# Patient Record
Sex: Female | Born: 1935 | Race: White | Hispanic: No | Smoking: Former smoker
Health system: Southern US, Community
[De-identification: ages and names within clinical notes are randomized; demographics above are authoritative.]

## PROBLEM LIST (undated history)

## (undated) DIAGNOSIS — I10 Essential (primary) hypertension: Secondary | ICD-10-CM

## (undated) DIAGNOSIS — F0390 Unspecified dementia without behavioral disturbance: Secondary | ICD-10-CM

## (undated) DIAGNOSIS — E269 Hyperaldosteronism, unspecified: Secondary | ICD-10-CM

## (undated) DIAGNOSIS — C801 Malignant (primary) neoplasm, unspecified: Secondary | ICD-10-CM

## (undated) DIAGNOSIS — M797 Fibromyalgia: Secondary | ICD-10-CM

## (undated) DIAGNOSIS — E559 Vitamin D deficiency, unspecified: Secondary | ICD-10-CM

## (undated) DIAGNOSIS — E876 Hypokalemia: Secondary | ICD-10-CM

## (undated) DIAGNOSIS — N289 Disorder of kidney and ureter, unspecified: Secondary | ICD-10-CM

## (undated) HISTORY — PX: BREAST SURGERY: SHX581

---

## 1998-03-05 ENCOUNTER — Other Ambulatory Visit: Admission: RE | Admit: 1998-03-05 | Discharge: 1998-03-05 | Payer: Self-pay | Admitting: Internal Medicine

## 1998-06-25 ENCOUNTER — Ambulatory Visit (HOSPITAL_COMMUNITY): Admission: RE | Admit: 1998-06-25 | Discharge: 1998-06-25 | Payer: Self-pay | Admitting: Internal Medicine

## 1998-09-10 ENCOUNTER — Ambulatory Visit (HOSPITAL_COMMUNITY): Admission: RE | Admit: 1998-09-10 | Discharge: 1998-09-10 | Payer: Self-pay | Admitting: *Deleted

## 1998-09-16 ENCOUNTER — Encounter: Payer: Self-pay | Admitting: *Deleted

## 1998-09-16 ENCOUNTER — Ambulatory Visit (HOSPITAL_COMMUNITY): Admission: RE | Admit: 1998-09-16 | Discharge: 1998-09-16 | Payer: Self-pay | Admitting: *Deleted

## 1998-12-12 ENCOUNTER — Encounter: Payer: Self-pay | Admitting: Internal Medicine

## 1998-12-12 ENCOUNTER — Inpatient Hospital Stay (HOSPITAL_COMMUNITY): Admission: EM | Admit: 1998-12-12 | Discharge: 1998-12-13 | Payer: Self-pay | Admitting: Emergency Medicine

## 1999-07-07 ENCOUNTER — Ambulatory Visit (HOSPITAL_COMMUNITY): Admission: RE | Admit: 1999-07-07 | Discharge: 1999-07-07 | Payer: Self-pay | Admitting: Internal Medicine

## 1999-07-07 ENCOUNTER — Encounter: Payer: Self-pay | Admitting: Internal Medicine

## 1999-09-02 ENCOUNTER — Emergency Department (HOSPITAL_COMMUNITY): Admission: EM | Admit: 1999-09-02 | Discharge: 1999-09-02 | Payer: Self-pay | Admitting: *Deleted

## 1999-09-02 ENCOUNTER — Encounter: Payer: Self-pay | Admitting: *Deleted

## 2000-07-08 ENCOUNTER — Ambulatory Visit (HOSPITAL_COMMUNITY): Admission: RE | Admit: 2000-07-08 | Discharge: 2000-07-08 | Payer: Self-pay | Admitting: Internal Medicine

## 2000-07-08 ENCOUNTER — Encounter: Payer: Self-pay | Admitting: Internal Medicine

## 2001-05-27 ENCOUNTER — Encounter: Payer: Self-pay | Admitting: Internal Medicine

## 2001-05-27 ENCOUNTER — Encounter: Admission: RE | Admit: 2001-05-27 | Discharge: 2001-05-27 | Payer: Self-pay | Admitting: Internal Medicine

## 2001-07-07 ENCOUNTER — Encounter: Admission: RE | Admit: 2001-07-07 | Discharge: 2001-08-04 | Payer: Self-pay | Admitting: Internal Medicine

## 2001-07-14 ENCOUNTER — Ambulatory Visit (HOSPITAL_COMMUNITY): Admission: RE | Admit: 2001-07-14 | Discharge: 2001-07-14 | Payer: Self-pay | Admitting: Internal Medicine

## 2001-07-14 ENCOUNTER — Encounter: Payer: Self-pay | Admitting: Internal Medicine

## 2001-08-09 ENCOUNTER — Other Ambulatory Visit: Admission: RE | Admit: 2001-08-09 | Discharge: 2001-08-09 | Payer: Self-pay | Admitting: Internal Medicine

## 2001-08-16 ENCOUNTER — Encounter: Payer: Self-pay | Admitting: Internal Medicine

## 2001-08-16 ENCOUNTER — Encounter: Admission: RE | Admit: 2001-08-16 | Discharge: 2001-08-16 | Payer: Self-pay | Admitting: Internal Medicine

## 2002-07-20 ENCOUNTER — Encounter: Payer: Self-pay | Admitting: Internal Medicine

## 2002-07-20 ENCOUNTER — Ambulatory Visit (HOSPITAL_COMMUNITY): Admission: RE | Admit: 2002-07-20 | Discharge: 2002-07-20 | Payer: Self-pay | Admitting: Internal Medicine

## 2002-08-17 ENCOUNTER — Ambulatory Visit (HOSPITAL_COMMUNITY): Admission: RE | Admit: 2002-08-17 | Discharge: 2002-08-17 | Payer: Self-pay | Admitting: *Deleted

## 2002-08-17 ENCOUNTER — Other Ambulatory Visit: Admission: RE | Admit: 2002-08-17 | Discharge: 2002-08-17 | Payer: Self-pay | Admitting: Internal Medicine

## 2003-04-05 ENCOUNTER — Ambulatory Visit (HOSPITAL_COMMUNITY): Admission: RE | Admit: 2003-04-05 | Discharge: 2003-04-05 | Payer: Self-pay | Admitting: *Deleted

## 2003-07-19 ENCOUNTER — Encounter: Payer: Self-pay | Admitting: Internal Medicine

## 2003-07-19 ENCOUNTER — Ambulatory Visit (HOSPITAL_COMMUNITY): Admission: RE | Admit: 2003-07-19 | Discharge: 2003-07-19 | Payer: Self-pay | Admitting: Internal Medicine

## 2003-12-19 ENCOUNTER — Emergency Department (HOSPITAL_COMMUNITY): Admission: EM | Admit: 2003-12-19 | Discharge: 2003-12-20 | Payer: Self-pay | Admitting: Emergency Medicine

## 2004-08-14 ENCOUNTER — Ambulatory Visit (HOSPITAL_COMMUNITY): Admission: RE | Admit: 2004-08-14 | Discharge: 2004-08-14 | Payer: Self-pay | Admitting: Internal Medicine

## 2005-09-10 ENCOUNTER — Ambulatory Visit (HOSPITAL_COMMUNITY): Admission: RE | Admit: 2005-09-10 | Discharge: 2005-09-10 | Payer: Self-pay | Admitting: Internal Medicine

## 2006-04-15 ENCOUNTER — Encounter: Admission: RE | Admit: 2006-04-15 | Discharge: 2006-04-15 | Payer: Self-pay | Admitting: Internal Medicine

## 2006-04-24 ENCOUNTER — Encounter: Admission: RE | Admit: 2006-04-24 | Discharge: 2006-04-24 | Payer: Self-pay | Admitting: Internal Medicine

## 2006-05-13 ENCOUNTER — Encounter: Admission: RE | Admit: 2006-05-13 | Discharge: 2006-05-13 | Payer: Self-pay

## 2006-07-01 ENCOUNTER — Ambulatory Visit (HOSPITAL_COMMUNITY): Admission: RE | Admit: 2006-07-01 | Discharge: 2006-07-04 | Payer: Self-pay | Admitting: Orthopedic Surgery

## 2006-09-14 ENCOUNTER — Ambulatory Visit (HOSPITAL_COMMUNITY): Admission: RE | Admit: 2006-09-14 | Discharge: 2006-09-14 | Payer: Self-pay | Admitting: Internal Medicine

## 2006-11-04 ENCOUNTER — Encounter: Admission: RE | Admit: 2006-11-04 | Discharge: 2006-11-04 | Payer: Self-pay | Admitting: Internal Medicine

## 2007-09-23 ENCOUNTER — Encounter: Admission: RE | Admit: 2007-09-23 | Discharge: 2007-09-23 | Payer: Self-pay | Admitting: Internal Medicine

## 2008-01-04 ENCOUNTER — Ambulatory Visit (HOSPITAL_COMMUNITY): Admission: RE | Admit: 2008-01-04 | Discharge: 2008-01-04 | Payer: Self-pay | Admitting: *Deleted

## 2008-04-23 ENCOUNTER — Inpatient Hospital Stay (HOSPITAL_COMMUNITY): Admission: EM | Admit: 2008-04-23 | Discharge: 2008-04-24 | Payer: Self-pay | Admitting: Emergency Medicine

## 2008-08-16 ENCOUNTER — Ambulatory Visit: Payer: Self-pay | Admitting: Cardiology

## 2008-08-16 ENCOUNTER — Observation Stay (HOSPITAL_COMMUNITY): Admission: EM | Admit: 2008-08-16 | Discharge: 2008-08-17 | Payer: Self-pay | Admitting: Emergency Medicine

## 2008-08-17 ENCOUNTER — Ambulatory Visit: Payer: Self-pay | Admitting: Surgery

## 2008-08-17 ENCOUNTER — Encounter (INDEPENDENT_AMBULATORY_CARE_PROVIDER_SITE_OTHER): Payer: Self-pay | Admitting: Internal Medicine

## 2008-08-30 ENCOUNTER — Ambulatory Visit (HOSPITAL_COMMUNITY): Admission: RE | Admit: 2008-08-30 | Discharge: 2008-08-30 | Payer: Self-pay | Admitting: Internal Medicine

## 2008-10-05 ENCOUNTER — Ambulatory Visit (HOSPITAL_COMMUNITY): Admission: RE | Admit: 2008-10-05 | Discharge: 2008-10-05 | Payer: Self-pay | Admitting: Internal Medicine
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
## 2009-11-14 ENCOUNTER — Ambulatory Visit (HOSPITAL_COMMUNITY): Admission: RE | Admit: 2009-11-14 | Discharge: 2009-11-14 | Payer: Self-pay | Admitting: Internal Medicine

## 2010-02-04 ENCOUNTER — Ambulatory Visit: Payer: Self-pay | Admitting: Cardiology

## 2010-02-04 ENCOUNTER — Inpatient Hospital Stay (HOSPITAL_COMMUNITY)
Admission: EM | Admit: 2010-02-04 | Discharge: 2010-02-05 | Payer: Self-pay | Source: Home / Self Care | Admitting: Emergency Medicine

## 2010-02-05 ENCOUNTER — Encounter (INDEPENDENT_AMBULATORY_CARE_PROVIDER_SITE_OTHER): Payer: Self-pay | Admitting: Internal Medicine

## 2010-02-05 ENCOUNTER — Ambulatory Visit: Payer: Self-pay | Admitting: Vascular Surgery

## 2010-12-05 ENCOUNTER — Ambulatory Visit (HOSPITAL_COMMUNITY)
Admission: RE | Admit: 2010-12-05 | Discharge: 2010-12-05 | Payer: Self-pay | Source: Home / Self Care | Attending: Internal Medicine | Admitting: Internal Medicine

## 2010-12-12 ENCOUNTER — Other Ambulatory Visit: Payer: Self-pay | Admitting: Diagnostic Radiology

## 2010-12-12 ENCOUNTER — Encounter
Admission: RE | Admit: 2010-12-12 | Discharge: 2010-12-12 | Payer: Self-pay | Source: Home / Self Care | Attending: Internal Medicine | Admitting: Internal Medicine

## 2010-12-14 ENCOUNTER — Encounter: Payer: Self-pay | Admitting: Internal Medicine

## 2010-12-18 ENCOUNTER — Other Ambulatory Visit: Payer: Self-pay | Admitting: General Surgery

## 2010-12-18 ENCOUNTER — Other Ambulatory Visit (HOSPITAL_COMMUNITY): Payer: Self-pay | Admitting: General Surgery

## 2010-12-18 DIAGNOSIS — C50911 Malignant neoplasm of unspecified site of right female breast: Secondary | ICD-10-CM

## 2010-12-19 ENCOUNTER — Encounter
Admission: RE | Admit: 2010-12-19 | Discharge: 2010-12-19 | Payer: Self-pay | Source: Home / Self Care | Attending: Internal Medicine | Admitting: Internal Medicine

## 2010-12-25 ENCOUNTER — Other Ambulatory Visit: Payer: Self-pay | Admitting: General Surgery

## 2010-12-25 ENCOUNTER — Encounter (HOSPITAL_BASED_OUTPATIENT_CLINIC_OR_DEPARTMENT_OTHER)
Admission: RE | Admit: 2010-12-25 | Discharge: 2010-12-25 | Disposition: A | Discharge status: Home / Self Care | Payer: Medicare Other | Source: Ambulatory Visit | Attending: General Surgery | Admitting: General Surgery

## 2010-12-25 ENCOUNTER — Ambulatory Visit
Admission: RE | Admit: 2010-12-25 | Discharge: 2010-12-25 | Disposition: A | Discharge status: Home / Self Care | Payer: MEDICARE | Source: Ambulatory Visit | Attending: General Surgery | Admitting: General Surgery

## 2010-12-25 DIAGNOSIS — Z01812 Encounter for preprocedural laboratory examination: Secondary | ICD-10-CM | POA: Insufficient documentation

## 2010-12-25 DIAGNOSIS — Z01811 Encounter for preprocedural respiratory examination: Secondary | ICD-10-CM

## 2010-12-25 DIAGNOSIS — C50911 Malignant neoplasm of unspecified site of right female breast: Secondary | ICD-10-CM

## 2010-12-25 DIAGNOSIS — Z01810 Encounter for preprocedural cardiovascular examination: Secondary | ICD-10-CM | POA: Insufficient documentation

## 2010-12-25 LAB — URINALYSIS, ROUTINE W REFLEX MICROSCOPIC
Ketones, ur: NEGATIVE mg/dL
Nitrite: NEGATIVE
Protein, ur: NEGATIVE mg/dL
pH: 7 (ref 5.0–8.0)

## 2010-12-25 LAB — CBC
MCH: 29.8 pg (ref 26.0–34.0)
Platelets: 286 10*3/uL (ref 150–400)
RBC: 4.4 MIL/uL (ref 3.87–5.11)

## 2010-12-25 LAB — COMPREHENSIVE METABOLIC PANEL
Alkaline Phosphatase: 77 U/L (ref 39–117)
BUN: 18 mg/dL (ref 6–23)
Chloride: 93 mEq/L — ABNORMAL LOW (ref 96–112)
Glucose, Bld: 103 mg/dL — ABNORMAL HIGH (ref 70–99)
Potassium: 3.7 mEq/L (ref 3.5–5.1)
Total Bilirubin: 0.4 mg/dL (ref 0.3–1.2)

## 2010-12-25 LAB — DIFFERENTIAL
Basophils Absolute: 0.1 10*3/uL (ref 0.0–0.1)
Basophils Relative: 1 % (ref 0–1)
Eosinophils Absolute: 0.4 10*3/uL (ref 0.0–0.7)
Monocytes Relative: 9 % (ref 3–12)
Neutrophils Relative %: 60 % (ref 43–77)

## 2010-12-25 LAB — URINE MICROSCOPIC-ADD ON

## 2010-12-29 ENCOUNTER — Other Ambulatory Visit: Payer: Self-pay | Admitting: General Surgery

## 2010-12-29 ENCOUNTER — Ambulatory Visit: Admit: 2010-12-29 | Discharge: 2010-12-29 | Disposition: A | Discharge status: Home / Self Care | Payer: MEDICARE

## 2010-12-29 ENCOUNTER — Ambulatory Visit
Admission: RE | Admit: 2010-12-29 | Discharge: 2010-12-29 | Disposition: A | Discharge status: Home / Self Care | Payer: MEDICARE | Source: Ambulatory Visit | Attending: General Surgery | Admitting: General Surgery

## 2010-12-29 ENCOUNTER — Ambulatory Visit (HOSPITAL_COMMUNITY)
Admission: RE | Admit: 2010-12-29 | Discharge: 2010-12-29 | Disposition: A | Discharge status: Home / Self Care | Payer: Medicare Other | Source: Ambulatory Visit | Attending: General Surgery | Admitting: General Surgery

## 2010-12-29 ENCOUNTER — Ambulatory Visit (HOSPITAL_BASED_OUTPATIENT_CLINIC_OR_DEPARTMENT_OTHER)
Admission: RE | Admit: 2010-12-29 | Discharge: 2010-12-29 | Disposition: A | Discharge status: Home / Self Care | Payer: Medicare Other | Attending: General Surgery | Admitting: General Surgery

## 2010-12-29 DIAGNOSIS — Z01812 Encounter for preprocedural laboratory examination: Secondary | ICD-10-CM | POA: Insufficient documentation

## 2010-12-29 DIAGNOSIS — C50919 Malignant neoplasm of unspecified site of unspecified female breast: Secondary | ICD-10-CM | POA: Insufficient documentation

## 2010-12-29 DIAGNOSIS — Z01810 Encounter for preprocedural cardiovascular examination: Secondary | ICD-10-CM | POA: Insufficient documentation

## 2010-12-29 DIAGNOSIS — C50911 Malignant neoplasm of unspecified site of right female breast: Secondary | ICD-10-CM

## 2010-12-29 DIAGNOSIS — Z79899 Other long term (current) drug therapy: Secondary | ICD-10-CM | POA: Insufficient documentation

## 2010-12-29 MED ORDER — TECHNETIUM TC 99M SULFUR COLLOID FILTERED
1.0000 | Freq: Once | INTRAVENOUS | Status: AC | PRN
  Administered 2010-12-29: 1 via INTRADERMAL

## 2011-01-12 ENCOUNTER — Encounter: Payer: MEDICARE | Admitting: Oncology

## 2011-01-19 NOTE — Op Note (Signed)
NAMEDENNIS, KILLILEA            ACCOUNT NO.:  [id number]  MEDICAL RECORD NO.:  [id number]           PATIENT TYPE:  O  LOCATION:  NUC                          FACILITY:  [facility]  PHYSICIAN:  Sharlet Salina T. Deshanda Molitor, M.D.DATE OF BIRTH:  Nov 07, 1931  DATE OF PROCEDURE:  12/29/2010 DATE OF DISCHARGE:                              OPERATIVE REPORT   PREOPERATIVE DIAGNOSIS:  Cancer of the right breast.  POSTOPERATIVE DIAGNOSIS:  Cancer of the right breast.  SURGICAL PROCEDURES: 1. Blue dye injection, right breast. 2. Right axillary sentinel lymph node biopsy. 3. Needle-localized right breast lumpectomy.  SURGEON:  Lorne Skeens. Shakema Surita, MD  ANESTHESIA:  General.  BRIEF HISTORY:  Kaylee Turner is a 77 year old female who on recent mammogram was found to have a new approximately 1-cm mass in the inferolateral right breast.  Diagnostic imaging confirmed this and core biopsy has revealed an intermediate grade invasive ductal carcinoma. After discussion of initial surgical treatment options, we have elected to proceed with breast conservation with needle-localized lumpectomy and sentinel lymph node biopsy.  The nature of procedure, its indications, risks of bleeding, infection, anesthetic complications, and possible need for further surgery based on final pathology findings have been discussed and understood.  She is now brought to the operating room for this procedure.  DESCRIPTION OF OPERATION:  About 45 minutes preoperatively, 1 mCi of technetium sulfur colloid was injected by [facility] circumareolarly in the right breast.  The patient was brought to the operating room, placed in supine position on the operating table, and laryngeal mask general anesthesia was induced.  I sterilely prepped the right periareolar area and correct patient and procedure were verified. Following this, 5 mL of dilute methylene blue was injected subareolarly in the subcutaneous tissue and the  breast massaged for several minutes. Following this, the breast was widely sterilely prepped and draped.  The patient had undergone preoperative accurate needle [facility].  The NeoProbe was then used to identify an area in the right axilla of high counts and a small transverse incision was made here.  Dissection carried down through the subcutaneous tissue with cautery.  The axilla was carefully entered with blunt dissection.  Using the NeoProbe for guidance, I dissected down onto a lymph node that did contain blue dye and had high counts.  This lymph node was completely excised using cautery.  Ex vivo, the node had counts of about 300 and I could not get any counts in the background following excision.  This was sent for permanent section as hot blue right axillary sentinel lymph node.  The soft tissue was infiltrated with Marcaine and this wound was temporally packed.  Attention was then turned the lumpectomy.  I made a curvilinear incision in the inferolateral breast over the area of the tumor and there was a small palpable abnormality here.  Dissection was carried down into the superficial subcutaneous tissue.  The mass was somewhat superficial and short.  Skin and subcutaneous flaps were developed and then cautery was used to deepen dissection in all sides down through the breast tissue.  The wire was encountered and brought into the wound.  I then came around underneath the mass and it appeared to be well contained within the specimen.  The specimens was oriented with ink and specimen mammography was obtained showing the mass and the clip apparently in the center of the specimen.  The breast tissue was infiltrated with Marcaine and complete hemostasis was obtained.  Both wounds were then closed with subcutaneous interrupted 5-0 Monocryl and subcuticular 5-0 Monocryl and Dermabond.  Sponge, needle, and instrument counts were correct.  The patient was taken to  the recovery room in good condition.     Lorne Skeens. Chiquitta Matty, M.D.     Tory Emerald  D:  12/29/2010  T:  12/30/2010  Job:  [id number]  Electronically Signed by Glenna Fellows M.D. on 01/19/2011 03:12:00 PM

## 2011-01-20 ENCOUNTER — Ambulatory Visit: Payer: Medicare Other | Attending: Radiation Oncology | Admitting: Radiation Oncology

## 2011-01-20 DIAGNOSIS — Z7982 Long term (current) use of aspirin: Secondary | ICD-10-CM | POA: Insufficient documentation

## 2011-01-20 DIAGNOSIS — Z90710 Acquired absence of both cervix and uterus: Secondary | ICD-10-CM | POA: Insufficient documentation

## 2011-01-20 DIAGNOSIS — Z803 Family history of malignant neoplasm of breast: Secondary | ICD-10-CM | POA: Insufficient documentation

## 2011-01-20 DIAGNOSIS — C50919 Malignant neoplasm of unspecified site of unspecified female breast: Secondary | ICD-10-CM | POA: Insufficient documentation

## 2011-01-20 DIAGNOSIS — Z801 Family history of malignant neoplasm of trachea, bronchus and lung: Secondary | ICD-10-CM | POA: Insufficient documentation

## 2011-01-20 DIAGNOSIS — Z79899 Other long term (current) drug therapy: Secondary | ICD-10-CM | POA: Insufficient documentation

## 2011-01-20 DIAGNOSIS — Z9010 Acquired absence of unspecified breast and nipple: Secondary | ICD-10-CM | POA: Insufficient documentation

## 2011-01-20 DIAGNOSIS — I10 Essential (primary) hypertension: Secondary | ICD-10-CM | POA: Insufficient documentation

## 2011-01-20 DIAGNOSIS — Z170 Estrogen receptor positive status [ER+]: Secondary | ICD-10-CM | POA: Insufficient documentation

## 2011-02-02 ENCOUNTER — Encounter (HOSPITAL_BASED_OUTPATIENT_CLINIC_OR_DEPARTMENT_OTHER): Payer: Medicare Other | Admitting: Oncology

## 2011-02-02 ENCOUNTER — Other Ambulatory Visit: Payer: Self-pay | Admitting: Oncology

## 2011-02-02 DIAGNOSIS — Z853 Personal history of malignant neoplasm of breast: Secondary | ICD-10-CM

## 2011-02-02 DIAGNOSIS — C50419 Malignant neoplasm of upper-outer quadrant of unspecified female breast: Secondary | ICD-10-CM

## 2011-02-02 DIAGNOSIS — Z780 Asymptomatic menopausal state: Secondary | ICD-10-CM

## 2011-02-02 DIAGNOSIS — C50919 Malignant neoplasm of unspecified site of unspecified female breast: Secondary | ICD-10-CM

## 2011-02-02 LAB — CBC WITH DIFFERENTIAL/PLATELET
Eosinophils Absolute: 0.4 10*3/uL (ref 0.0–0.5)
MONO#: 0.7 10*3/uL (ref 0.1–0.9)
NEUT#: 5.7 10*3/uL (ref 1.5–6.5)
RBC: 4.12 10*6/uL (ref 3.70–5.45)
RDW: 12.9 % (ref 11.2–14.5)
WBC: 9.1 10*3/uL (ref 3.9–10.3)

## 2011-02-02 LAB — COMPREHENSIVE METABOLIC PANEL
Albumin: 3.4 g/dL — ABNORMAL LOW (ref 3.5–5.2)
Alkaline Phosphatase: 67 U/L (ref 39–117)
CO2: 32 mEq/L (ref 19–32)
Glucose, Bld: 104 mg/dL — ABNORMAL HIGH (ref 70–99)
Potassium: 3.7 mEq/L (ref 3.5–5.3)
Sodium: 135 mEq/L (ref 135–145)
Total Protein: 7.2 g/dL (ref 6.0–8.3)

## 2011-02-03 LAB — CANCER ANTIGEN 27.29: CA 27.29: 23 U/mL (ref 0–39)

## 2011-02-10 ENCOUNTER — Ambulatory Visit
Admission: RE | Admit: 2011-02-10 | Discharge: 2011-02-10 | Disposition: A | Discharge status: Home / Self Care | Payer: MEDICARE | Source: Ambulatory Visit | Attending: Oncology | Admitting: Oncology

## 2011-02-10 DIAGNOSIS — Z780 Asymptomatic menopausal state: Secondary | ICD-10-CM

## 2011-02-10 DIAGNOSIS — Z853 Personal history of malignant neoplasm of breast: Secondary | ICD-10-CM

## 2011-02-12 ENCOUNTER — Encounter (HOSPITAL_COMMUNITY)
Admission: RE | Admit: 2011-02-12 | Discharge: 2011-02-12 | Disposition: A | Discharge status: Home / Self Care | Payer: Medicare Other | Source: Ambulatory Visit | Attending: Oncology | Admitting: Oncology

## 2011-02-12 ENCOUNTER — Ambulatory Visit (HOSPITAL_COMMUNITY)
Admission: RE | Admit: 2011-02-12 | Discharge: 2011-02-12 | Disposition: A | Discharge status: Home / Self Care | Payer: Medicare Other | Source: Ambulatory Visit | Attending: Oncology | Admitting: Oncology

## 2011-02-12 ENCOUNTER — Encounter (HOSPITAL_COMMUNITY): Payer: Self-pay

## 2011-02-12 DIAGNOSIS — Z9010 Acquired absence of unspecified breast and nipple: Secondary | ICD-10-CM | POA: Insufficient documentation

## 2011-02-12 DIAGNOSIS — C50919 Malignant neoplasm of unspecified site of unspecified female breast: Secondary | ICD-10-CM

## 2011-02-12 HISTORY — DX: Malignant (primary) neoplasm, unspecified: C80.1

## 2011-02-12 MED ORDER — TECHNETIUM TC 99M MEDRONATE IV KIT: 24.0000 | PACK | Freq: Once | INTRAVENOUS | Status: DC | PRN

## 2011-02-15 LAB — DIFFERENTIAL
Basophils Absolute: 0.1 10*3/uL (ref 0.0–0.1)
Basophils Relative: 1 % (ref 0–1)
Monocytes Absolute: 0.9 10*3/uL (ref 0.1–1.0)
Neutro Abs: 4.9 10*3/uL (ref 1.7–7.7)
Neutrophils Relative %: 56 % (ref 43–77)

## 2011-02-15 LAB — CK TOTAL AND CKMB (NOT AT ARMC)
CK, MB: 1.2 ng/mL (ref 0.3–4.0)
Relative Index: INVALID (ref 0.0–2.5)
Total CK: 44 U/L (ref 7–177)

## 2011-02-15 LAB — CARDIAC PANEL(CRET KIN+CKTOT+MB+TROPI)
CK, MB: 1.3 ng/mL (ref 0.3–4.0)
Relative Index: INVALID (ref 0.0–2.5)
Total CK: 42 U/L (ref 7–177)

## 2011-02-15 LAB — URINALYSIS, ROUTINE W REFLEX MICROSCOPIC
Bilirubin Urine: NEGATIVE
Glucose, UA: NEGATIVE mg/dL
Specific Gravity, Urine: 1.006 (ref 1.005–1.030)
Urobilinogen, UA: 0.2 mg/dL (ref 0.0–1.0)

## 2011-02-15 LAB — CBC
HCT: 34.1 % — ABNORMAL LOW (ref 36.0–46.0)
Hemoglobin: 11.9 g/dL — ABNORMAL LOW (ref 12.0–15.0)
Hemoglobin: 13.3 g/dL (ref 12.0–15.0)
MCHC: 34.6 g/dL (ref 30.0–36.0)
MCV: 91.4 fL (ref 78.0–100.0)
Platelets: 272 10*3/uL (ref 150–400)
RDW: 12.3 % (ref 11.5–15.5)
RDW: 12.6 % (ref 11.5–15.5)

## 2011-02-15 LAB — BASIC METABOLIC PANEL
BUN: 13 mg/dL (ref 6–23)
CO2: 30 mEq/L (ref 19–32)
Chloride: 102 mEq/L (ref 96–112)
Glucose, Bld: 100 mg/dL — ABNORMAL HIGH (ref 70–99)
Potassium: 3.3 mEq/L — ABNORMAL LOW (ref 3.5–5.1)

## 2011-02-15 LAB — URINE MICROSCOPIC-ADD ON

## 2011-02-15 LAB — POCT CARDIAC MARKERS: Troponin i, poc: <0.05 ng/mL (ref 0.00–0.09)

## 2011-02-15 LAB — CULTURE, BLOOD (ROUTINE X 2): Culture: NO GROWTH

## 2011-02-15 LAB — POCT I-STAT, CHEM 8
Calcium, Ion: 1.2 mmol/L (ref 1.12–1.32)
Chloride: 96 mEq/L (ref 96–112)
HCT: 40 % (ref 36.0–46.0)
Hemoglobin: 13.6 g/dL (ref 12.0–15.0)

## 2011-02-15 LAB — TROPONIN I: Troponin I: 0.02 ng/mL (ref 0.00–0.06)

## 2011-02-15 LAB — URINE CULTURE

## 2011-03-13 IMAGING — CR DG CHEST 2V
2 series · 2 of 2 positions shown · non-contrast
Comparison: Chest x-ray of 02/04/2010

CLINICAL DATA: Preop for surgery for breast carcinoma

CHEST - 2 VIEW

[view not recorded (1 of 2)]
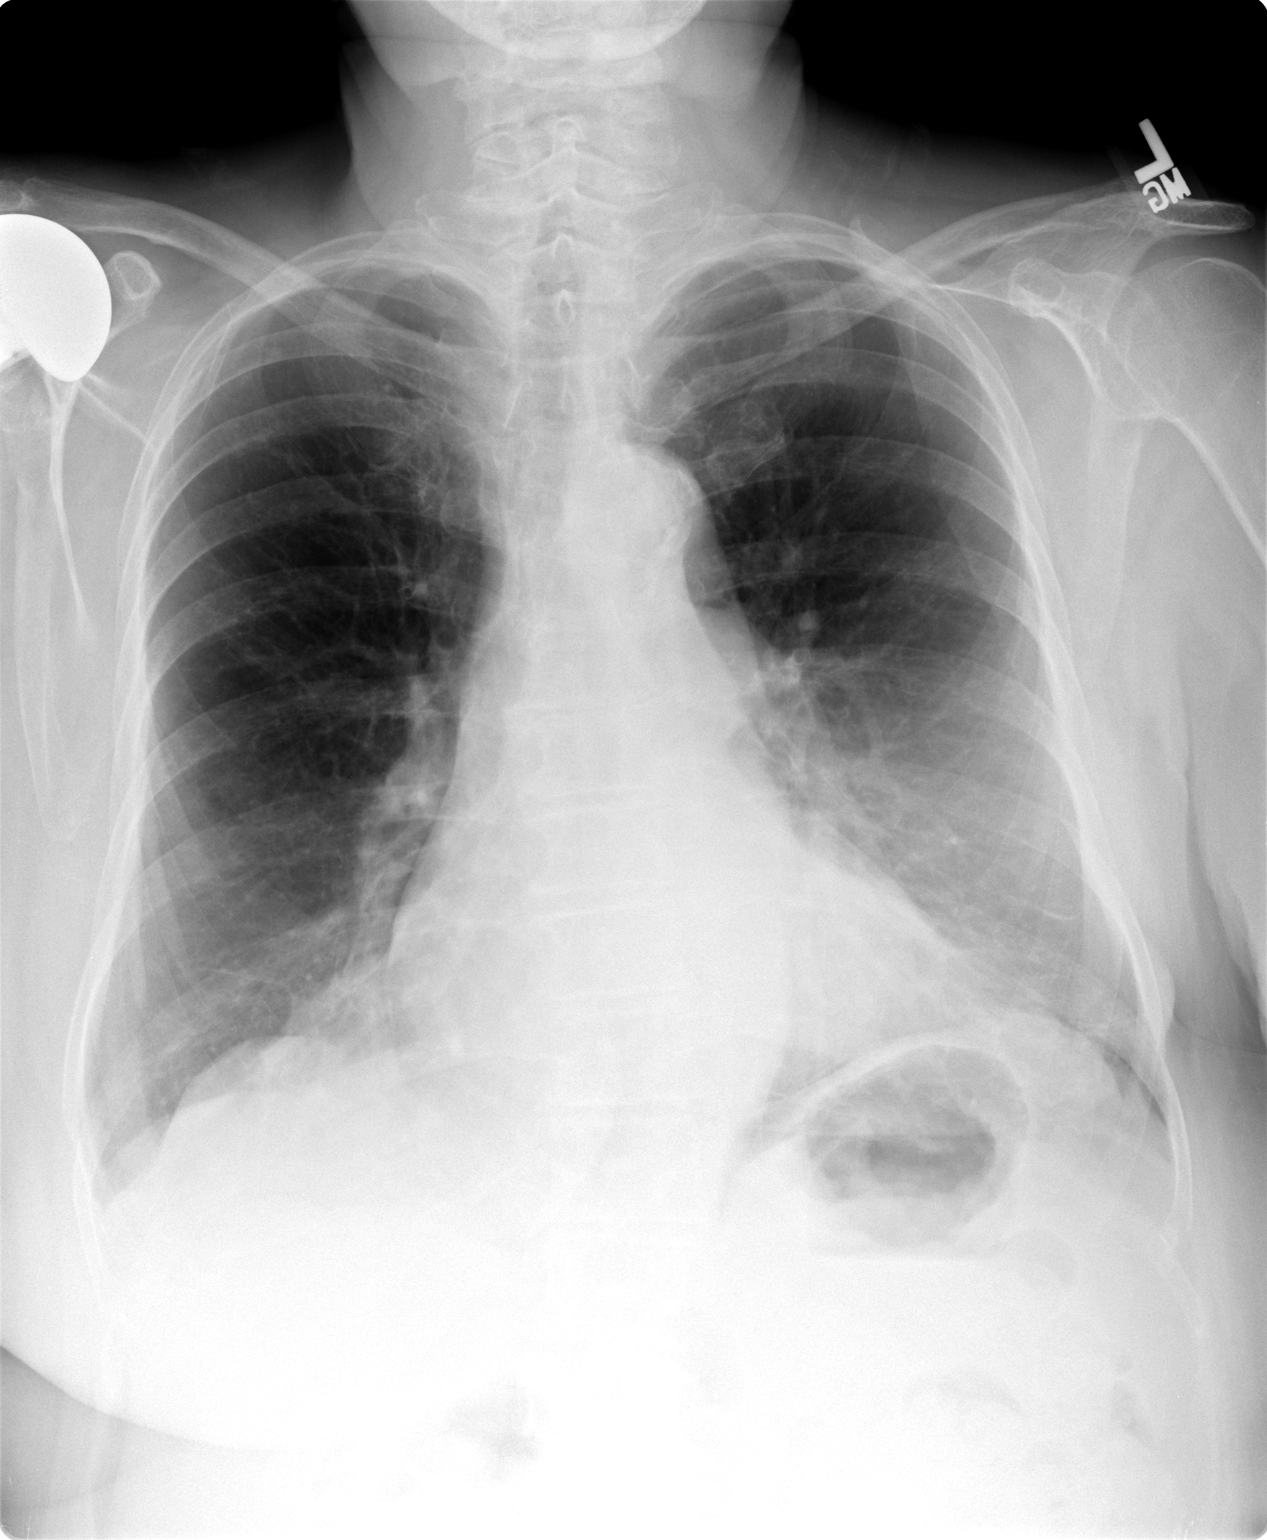

[view not recorded (2 of 2)]
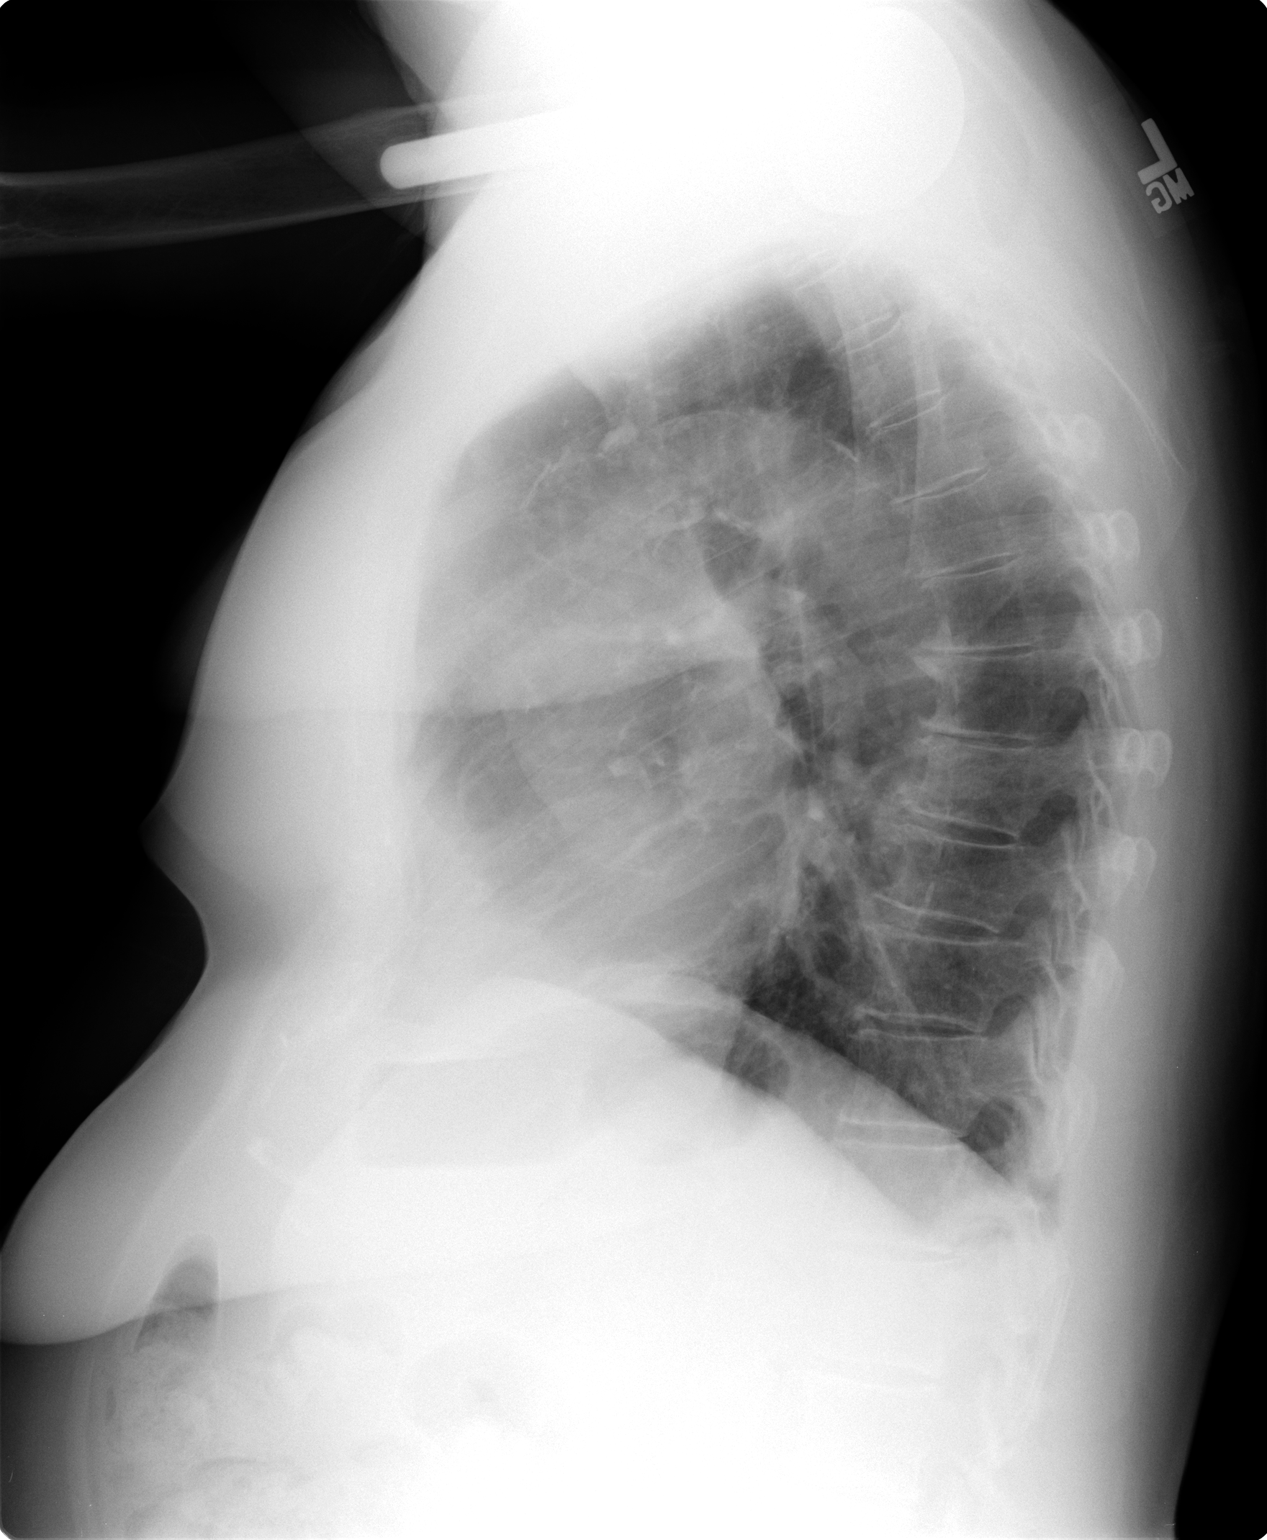

[2 of 2 positions shown; findings below may reference images not displayed]

FINDINGS: The lungs remain clear and slightly hyperaerated.
Mediastinal contours are stable.  Cardiomegaly is stable.  There
are degenerative changes throughout the thoracic spine.  A right
humeral head prosthesis remains.
IMPRESSION: Stable chest x-ray with hyperaeration and mild cardiomegaly.  No
active lung disease.
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
## 2011-04-07 NOTE — H&P (Signed)
Kaylee Turner, Kaylee Turner            ACCOUNT NO.:  [id number]   MEDICAL RECORD NO.:  [id number]          PATIENT TYPE:  INP   LOCATION:  4731                         FACILITY:  [facility]   PHYSICIAN:  Della Goo, M.D. DATE OF BIRTH:  04/14/1931   DATE OF ADMISSION:  04/22/2008  DATE OF DISCHARGE:                              HISTORY & PHYSICAL   PRIMARY CARE PHYSICIAN:  Dr. Ricki Miller.   CHIEF COMPLAINT:  Rectal bleeding.   HISTORY OF PRESENT ILLNESS:  This is a 77 year old female presenting to  the emergency department with complaints of sudden onset of rectal  bleeding that started about 10:00 p.m.  The patient reports feeling the  urge to go pass her bowels and reports passing bright red blood per  rectum.  She reports she has a history of diverticulosis and  diverticulitis and has had four episodes in the past of rectal bleeding  secondary to this.  She denies having any fever, chills, weakness,  dizziness.  She denies having any fatigue and denies having any chest  pain or shortness of breath or syncope.  The patient denies having any  previous abdominal pain associated with this.  She does report having  chronic hip and abdominal pain.   PAST MEDICAL HISTORY:  Significant for hypertension, diverticulitis and  diverticulosis as mentioned above, fibromyalgia, history of previous  breast cancer with left-sided mastectomy November 26, 1985, also a history  of a partial left breast reconstruction, also history of a right  shoulder rotator cuff repair and right carpal tunnel release as well as  a left cataract extraction and lens implantation.  The patient also has  a history of recurrent urinary tract infection and is on suppressive  therapy.    Her medications at this time include:  1. Benicar 40/25 one p.o. daily.  2. Macrodantin daily.  3. Toprol XL 50 mg 1 p.o. daily.   ALLERGIES:  TO PENICILLIN.   SOCIAL HISTORY:  The patient is a nonsmoker, nondrinker, and family  history  is noncontributory.   REVIEW OF SYSTEMS:  Pertinents are mentioned above.   PHYSICAL EXAMINATION FINDINGS:  This is an elderly obese 77 year old  female in no discomfort or acute distress currently.  Her vital signs  are:  Temperature 97, blood pressure 190/93, heart rate 69, respirations  22, O2 saturations 96-98% on room air.  HEENT EXAMINATION:  Normocephalic, atraumatic.  Pupils equally round,  reactive to light.  Extraocular muscles are intact.  Funduscopic benign.  Oropharynx is clear.  NECK:  Is supple.  Full range of motion.  No thyromegaly, adenopathy, or  jugular venous distention.  CARDIOVASCULAR:  Regular rate and rhythm.  No murmurs, gallops or rubs.  LUNGS:  Clear to auscultation bilaterally.  ABDOMEN:  Positive bowel sounds, soft, nontender, nondistended.  EXTREMITIES:  Without cyanosis, clubbing or edema.  NEUROLOGIC EXAMINATION:  Grossly nonfocal.   LABORATORY STUDIES:  White blood cell count 9.1, hemoglobin 12.4,  hematocrit 35.9, platelets 294, neutrophils 47%, lymphocytes 37%.  Protime 13.1 and INR 1, PTT 29.  Sodium 138, potassium 3.2, chloride 98,  bicarb 30, BUN 19, creatinine 1.1, and glucose  93.  CT scan of abdomen  and pelvis reveal descending colonic diverticulosis without evidence of  diverticulitis.  Otherwise no acute abnormalities.   ASSESSMENT:  A 77 year old female being admitted with:  1. Rectal bleeding.  2. Diverticulosis.  3. Hypertension.  4. Mild hypokalemia.   PLAN:  The patient will be admitted to telemetry area for monitoring.  Serial hemoglobin/hematocrit checks will be performed over 48 hours.  A  type and screen has been ordered in the event that the patient may need  a transfusion for a low hemoglobin level.  IV fluids have been ordered  for maintenance therapy, and clear liquids have been ordered for bowel  rest.  GI prophylaxis with IV Protonix has been ordered and DVT  prophylaxis of TED hose.  The patient will continue on her  regular  medications for now, and a GI consultation will be requested.  In the  past, the patient has had evaluations by Dr. Virginia Rochester, gastroenterology, and  her last colonoscopy for rectal bleeding was performed on January 04, 2008.      Della Goo, M.D.  Electronically Signed     HJ/MEDQ  D:  04/23/2008  T:  04/23/2008  Job:  [id number]   cc:   Juline Patch, M.D.

## 2011-04-07 NOTE — Discharge Summary (Signed)
NAMEJOIE, HIPPS            ACCOUNT NO.:  [id number]   MEDICAL RECORD NO.:  [id number]          PATIENT TYPE:  INP   LOCATION:  4731                         FACILITY:  [facility]   PHYSICIAN:  Beckey Rutter, MD  DATE OF BIRTH:  Feb 12, 1931   DATE OF ADMISSION:  04/23/2008  DATE OF DISCHARGE:  04/24/2008                               DISCHARGE SUMMARY   CHIEF COMPLAINT:  Rectal bleeding.   HOSPITAL COURSE:  The patient was monitored during the hospital stay  with frequent H&H.  Her hemoglobin and hematocrit remained stable.  Last  H&H is 11.7 and 33.9.   The patient was seen by Dr. Virginia Rochester, gastroenterologist; and he suggested  this bleeding is likely secondary to hemorrhoidal rather than from the  diverticulosis.  The patient is stable for discharge today.  Rest of  medical problem remained stable during this short hospital stay.   DISCHARGE DIAGNOSES:  1. Lower gastrointestinal bleeding, felt secondary to hemorrhoid      although the patient known to have diverticulosis.  2. Diverticulosis.  3. Hypertension.  4. Recurrent urinary tract infection.   DISCHARGE MEDICATIONS:  1. Toprol-XL 50 mg every night.  2. Benicar 40 mg a.m.  3. Hydrochlorothiazide 25 mg a.m.  4. Macrodantin 100 mg every night.  5. Os-Cal plus vitamin D 600 mg every morning.  6. Tylenol p.r.n.  7. The patient was advised to stop taking baby aspirin for the coming      2 weeks and start afterwards.   DISCHARGE PLAN:  The patient was advised to come back immediately to the  emergency department if she starts to have lower GI bleeding, and she  should follow up with Dr. Virginia Rochester within 2 weeks.      Beckey Rutter, MD  Electronically Signed     EME/MEDQ  D:  04/24/2008  T:  04/25/2008  Job:  [id number]
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
## 2011-04-07 NOTE — Op Note (Signed)
Kaylee Turner, Kaylee Turner            ACCOUNT NO.:  [id number]   MEDICAL RECORD NO.:  [id number]          PATIENT TYPE:  AMB   LOCATION:  ENDO                         FACILITY:  [facility]   PHYSICIAN:  Georgiana Spinner, M.D.    DATE OF BIRTH:  1931/10/11   DATE OF PROCEDURE:  01/04/2008  DATE OF DISCHARGE:                               OPERATIVE REPORT   PROCEDURE:  Colonoscopy.   INDICATIONS:  Rectal bleeding.   ANESTHESIA:  Fentanyl 100 mcg, Versed 10 mg.   DESCRIPTION OF PROCEDURE:  With the patient mildly sedated and in the  left lateral decubitus position, the Pentax videoscopic colonoscope was  inserted in the rectum and passed under direct vision to the sigmoid  colon at which point we had to withdraw because of the tightness of this  area.  Subsequently the Pentax videoscopic pediatric colonoscope was  inserted into the rectum and passed under direct vision to the cecum,  identified by ileocecal valve and appendiceal orifice, both of which  were photographed.  From this point,  the colonoscope was slowly  withdrawn, taking circumferential views of the colonic mucosa, stopping  only to photograph diverticulosis seen along the way until it reached  the rectum which appeared normal on direct and showed hemorrhoids on  retroflexed view.  The endoscope was straightened and withdrawn.  The  patient's vital signs and pulse oximetry remained stable.  The patient  tolerated the procedure well without apparent complications.   FINDINGS:  Severe diverticulosis of the sigmoid colon and internal  hemorrhoids; otherwise unremarkable examination.   PLAN:  Repeat examination in 5 to 10 years if necessary.           ______________________________  Georgiana Spinner, M.D.     GMO/MEDQ  D:  01/04/2008  T:  01/05/2008  Job:  [id number]
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
## 2011-04-10 NOTE — Op Note (Signed)
   NAMELAURENCIA, [name]                      ACCOUNT NO.:  [id number]   MEDICAL RECORD NO.:  [id number]                   PATIENT TYPE:  AMB   LOCATION:  ENDO                                 FACILITY:  [facility]   PHYSICIAN:  Georgiana Spinner, M.D.                 DATE OF BIRTH:  04/22/1931   DATE OF PROCEDURE:  08/17/2002  DATE OF DISCHARGE:                                 OPERATIVE REPORT   PROCEDURE PERFORMED:  Upper endoscopy with Savary dilation.   ENDOSCOPIST:  Georgiana Spinner, M.D.   INDICATIONS FOR PROCEDURE:  Dysphagia.   ANESTHESIA:  Demerol  and Versed were given.   ANTIBIOTICS:  Erythromycin 1000 mg.   DESCRIPTION OF PROCEDURE:  With the patient mildly sedated in the left  lateral decubitus position, the Olympus video endoscope was inserted in the  mouth and passed under direct vision through the esophagus which appeared  normal until we reached the distal esophagus where there was a stricture  seen and photographed.  We entered into the stomach.  The fundus, body,  antrum, duodenal bulb and second portion of the duodenum all appeared  normal.  From this point, the endoscope was slowly withdrawn taking  circumferential views of the duodenal mucosa until the endoscope was pulled  back into the stomach and placed on retroflexion to view the stomach from  below.  The endoscope was then straightened and a guidewire was passed.  The  endoscope was withdrawn.  Subsequently over the guidewire, Savary 14 and 17  dilators were passed with minimal resistance.  The endoscope was reinserted.  It was seen that the stricture had been broken and a small amount of  bleeding seen.  The endoscope was withdrawn.  The patient's vital signs and  pulse oximeter remained stable.  The patient tolerated the procedure well  without apparent complications.   FINDINGS:  Distal esophageal stricture dilated, await clinical response.                                                   Georgiana Spinner, M.D.    GMO/MEDQ  D:  08/17/2002  T:  08/18/2002  Job:  [id number]

## 2011-04-10 NOTE — Op Note (Signed)
Kaylee Turner, Kaylee Turner            ACCOUNT NO.:  [id number]   MEDICAL RECORD NO.:  [id number]          PATIENT TYPE:  INP   LOCATION:  2899                         FACILITY:  [facility]   PHYSICIAN:  Vania Rea. Supple, M.D.  DATE OF BIRTH:  [birth year]   DATE OF PROCEDURE:  07/01/2006  DATE OF DISCHARGE:                                 OPERATIVE REPORT   PREOPERATIVE DIAGNOSIS:  Right shoulder end-stage rotator cuff tear  arthropathy.   POSTOPERATIVE DIAGNOSIS:  Right shoulder end-stage rotator cuff tear  arthropathy.   PROCEDURE:  Right shoulder Press-Fit cuff tear arthropathy hemiarthroplasty  utilizing a [employer] size 12 humeral stem and 48 x 18 CTA head.   SURGEON OF RECORD:  Vania Rea. Supple, M.D.   ASSISTANTFrench Ana Shuford PA-C.   ANESTHESIA:  General endotracheal as well as a preoperative interscalene  block.   ESTIMATED BLOOD LOSS:  200 mL.   DRAINS:  None.   HISTORY:  Kaylee Turner is a 77 year old female who has had chronic right  shoulder pain, weakness and limitations in motion secondary to an advanced  rotator cuff tear arthropathy.  Her plain radiographs confirm a high-riding  humeral head with MRI scan confirming a massive and chronically retracted  tear of the rotator cuff.  Due to her ongoing pain and functional  limitations, she is brought to the operating room at this time for planned  right shoulder hemiarthroplasty as described below.   Preoperatively I counseled Kaylee Turner on treatment options as well as  risks versus benefits thereof.  Possible surgical complications of bleeding,  infection, neurovascular injury, persistent pain, loss of motion,  dislocation of the implant and the potential need for revision are reviewed.  She understands and accepts and agrees with our planned procedure.   PROCEDURE IN DETAIL:  After undergoing routine preop evaluation, the patient  received prophylactic antibiotics.  An interscalene block was established in  the  holding area by the anesthesia department.  Placed supine on the  operating table and underwent smooth induction of a general endotracheal  anesthesia.  Placed into the beach-chair position and appropriately padded  and protected.  The right shoulder girdle region was sterilely prepped and  draped in standard fashion.  An anterior deltopectoral incision was made  beginning at the coracoid process and extending laterally and distally for a  total length approximately 12 cm.  Sharp dissection was carried down through  skin and subcutaneous tissues and electrocautery was used for hemostasis.  The deltopectoral interval was then identified proximally and dissection and  development of this plane was extended distally, and a self-retaining  retractor was placed.  The upper margin of the pectoralis major tendon was  tenotomized approximately 1 cm in length.  The deltoid was then mobilized  from adhesions to the humeral head and remnants of the rotator cuff.  The  conjoined tendon was then mobilized medially and a small 0.5 -cm tenotomy at  the base of the coracoid process was made to help mobilize the conjoined  tendon.  The subscapularis was then divided away from its insertion onto the  lesser tuberosity  and reflected medially.  The free margin was then tagged  with a series of #1 FiberWire sutures.  The subscapularis was then  circumferentially mobilized and adhesions between the coracoid and rotator  interval were then divided.  The anterior capsule was then divided adjacent  to the anterior labrum and we confirmed that the subscapularis had good  mobility and elasticity.  The subscapularis was then reflected medially  beneath the self-retaining retractor.  The biceps tendon was then elevated  from the bicipital groove and then traced proximally and amputated at the  superior glenoid origin and then reflected distally.  Of note, the biceps  tendon was noted to be severely attenuated at the  level of the bicipital  groove and had a significant torn portion within the joint.  The  glenohumeral joint surfaces were then inspected.  The glenoid was congruent  with good articular cartilage.  The proximal humerus was then elevated  through the wound and the extramedullary guide was then utilized to develop  an osteotomy starting point for resection of the humeral head.  An  oscillating saw was then used to resect the humeral head at the proper  level, maintaining approximately 30 degrees of retroversion of the humeral  head.  The head was then measured and the 48 x 18 head had the best  approximation of the humeral head.  We then introduced a series of hand  reamers down the humeral canal and reamed up to a size 12 by hand.  Sequential broaching was were then performed up to size 12, maintaining  proper degree of retroversion of the humeral implant.  Once the size 12  implant was in place, were then used the CTA head cutting guide to resect  the prominence of the greater tuberosity.  The CTA trial head was then  placed and a reduction was then performed.  There was good mobility and  translation of the humeral head across the glenoid.  Good soft tissue  balance was achieved.  The shoulder was then re-dislocated and the trial  head was removed and the trial broach was also removed.  The humeral canal  was then irrigated.  The joint was copiously irrigated and all residual  debris was removed.  The final size 12 implant was then directed down the  humeral canal.  Prior to final seating, the bone graft obtained from the  humeral head was morcellized and placed about the proximal aspect of the  humeral implant.  The humeral implant was then hammered into final position,  obtaining excellent bony interference fit.  The final 48 x 18 head was then  impacted into position after the [facility] taper was meticulously cleaned and dried.  Final reduction was performed.  Excellent shoulder mobility  and soft  tissue balance was achieved.  The subscapularis was then repaired to the  lesser tuberosity through a series of bone tunnels utilizing the #2  FiberWire.  This repair was also oversewn with a figure-of-eight #1 Vicryl  suture.  The suture limbs from the subscapularis repair were then utilized  to also perform a biceps tendon tenodesis.  Final irrigation was then  completed.  Hemostasis was obtained.  The deltopectoral interval was allowed  to close.  The subcu layer was closed with 2-0 Vicryl and an intracuticular  3-0 Monocryl used to close the skin.  Steri-Strips were applied.  A dry  dressing was then taped over the right shoulder, right arm was placed into a  sling immobilizer.  The patient was then placed supine, extubated and taken  to the recovery room in stable condition.      Vania Rea. Supple, M.D.  Electronically Signed     KMS/MEDQ  D:  07/01/2006  T:  07/01/2006  Job:  [id number]

## 2011-04-10 NOTE — Op Note (Signed)
   Kaylee Turner, Kaylee Turner                      ACCOUNT NO.:  [id number]   MEDICAL RECORD NO.:  [id number]                   PATIENT TYPE:  AMB   LOCATION:  ENDO                                 FACILITY:  [facility]   PHYSICIAN:  Georgiana Spinner, M.D.                 DATE OF BIRTH:  12-Sep-1931   DATE OF PROCEDURE:  04/05/2003  DATE OF DISCHARGE:                                 OPERATIVE REPORT   PROCEDURE PERFORMED:  Colonoscopy.   ENDOSCOPIST:  Georgiana Spinner, M.D.   INDICATIONS FOR PROCEDURE:  Rectal bleeding.   ANESTHESIA:  Demerol 50 mg, Versed 5 mg.   DESCRIPTION OF PROCEDURE:  With the patient mildly sedated in the left  lateral decubitus position, the Olympus video colonoscope was inserted in  the rectum and passed under direct vision to the cecum, identified by the  ileocecal valve and appendiceal orifice.  Of note, the sigmoid colon was  very tortuous and diverticula filled.  From this point we photographed the  appendiceal orifice and ileocecal valve, entered into the terminal ileum  which also appeared normal and was photographed.  From this point the  colonoscope was slowly withdrawn taking circumferential views of the colonic  mucosa visualized stopping only to photograph diverticula seen throughout  the colon, both right and left side, more so in the sigmoid colon until we  reached the rectum which appeared normal on direct and showed hemorrhoids on  retroflex view.  The endoscope was straightened and withdrawn.  The  patient's vital signs and pulse oximeter remained stable.  The patient  tolerated the procedure well without apparent complications.   FINDINGS:  Diverticula filled colon, more so in the sigmoid.  Hemorrhoids  were seen.  Otherwise unremarkable colonoscopic examination.   PLAN:  Have patient follow up with me in five to 10 years.                                                   Georgiana Spinner, M.D.    GMO/MEDQ  D:  04/05/2003  T:  04/06/2003  Job:   [id number]

## 2011-04-10 NOTE — Discharge Summary (Signed)
Kaylee Turner, [name]            ACCOUNT NO.:  [id number]   MEDICAL RECORD NO.:  [id number]          PATIENT TYPE:  INP   LOCATION:  5020                         FACILITY:  [facility]   PHYSICIAN:  French Ana A. Shuford, P.A.-C.DATE OF BIRTH:  November 08, 1931   DATE OF ADMISSION:  07/01/2006  DATE OF DISCHARGE:  07/04/2006                                 DISCHARGE SUMMARY   ADMISSION DIAGNOSES:  1. Right shoulder glenohumeral osteoarthrosis and rotator cuff tear      arthropathy.  2. Hypertension.  3. Reflux.   DISCHARGE DIAGNOSES:  1. Same.  2. Status post right shoulder hemiarthroplasty.   BRIEF HISTORY:  [name] is a pleasant 77 year old female who has had  progressive pain and disability secondary to right shoulder rotator cuff  tear arthropathy.  X-rays have shown high-riding humeral head with advanced  arthrosis.  Right shoulder hemiarthroplasty was indicated.  Risks, benefits  discussed with patient at length and she wished to proceed.   OPERATIONS:  Right shoulder hemiarthroplasty with CTA replacement.  Surgeon  Dr. Francena Hanly, assistant Ralene Bathe, PA-C, under general anesthetic.   HOSPITAL COURSE:  Patient was admitted and underwent the above-named  procedure and tolerated this well.  All appropriate IV antibiotics and  analgesics were utilized.  Postoperatively, patient was slow to ambulate due  to her elderly status and was felt to require additional days towards  independence.  She had limited help at home, so home health OT/PT as well as  RN and bath aides need to be obtained.  She was kept through date July 04, 2006.  O2 sats have been a little low felt from lack of inspirations.  Incentive spirometer was instituted; this did extremely well.  At this  point, she was felt medically and orthopedically stable to be discharged to  home.  On July 04, 2006, her incision was clean and dry, she was  neurovascularly intact, and arrangements have been made for home  with  advanced home care.  She was stable for discharge to follow up on an  outpatient basis.  Laboratory data section shows admission labs within  normal limits.  See chart for details.  Other than a very mild hyponatremia  at 133.  I do not see EKG at time of dictation.  Condition on discharge is  stable and improved.   DISCHARGE MEDICATIONS AND PLAN:  Patient was discharged to home.  Follow up  on an outpatient basis.  She is to resume her home medications and home  diet.  Home health OT/PT are arranged for standard shoulder hemiarthroplasty  protocol.  May shower on day 5.  Follow up at 2 weeks postoperative.  Call  for a time.      Tracy A. Shuford, P.A.-C.     TAS/MEDQ  D:  07/15/2006  T:  07/15/2006  Job:  [id number]

## 2011-08-20 LAB — CBC
HCT: 33.9 — ABNORMAL LOW
Hemoglobin: 12.4
MCHC: 34.5
MCHC: 34.6
MCV: 88.8
MCV: 89.3
Platelets: 237
RDW: 13.1
WBC: 8.4

## 2011-08-20 LAB — POCT I-STAT, CHEM 8
BUN: 19
Calcium, Ion: 1.16
Chloride: 98
Creatinine, Ser: 1.1
Glucose, Bld: 93
HCT: 38
Hemoglobin: 12.9
Potassium: 3.2 — ABNORMAL LOW
Sodium: 138
TCO2: 30

## 2011-08-20 LAB — URINALYSIS, ROUTINE W REFLEX MICROSCOPIC
Bilirubin Urine: NEGATIVE
Glucose, UA: NEGATIVE
Hgb urine dipstick: NEGATIVE
Ketones, ur: NEGATIVE
Protein, ur: NEGATIVE
pH: 7

## 2011-08-20 LAB — BASIC METABOLIC PANEL
BUN: 9
Chloride: 103
GFR calc Af Amer: >60
GFR calc non Af Amer: >60
Potassium: 3.4 — ABNORMAL LOW
Sodium: 135

## 2011-08-20 LAB — TYPE AND SCREEN: Antibody Screen: NEGATIVE

## 2011-08-20 LAB — DIFFERENTIAL
Basophils Absolute: 0
Basophils Relative: 1
Eosinophils Absolute: 0.5
Monocytes Absolute: 0.9
Monocytes Relative: 10
Neutro Abs: 4.3
Neutrophils Relative %: 47

## 2011-08-20 LAB — HEMOGLOBIN AND HEMATOCRIT, BLOOD
HCT: 38.6
Hemoglobin: 13

## 2011-08-20 LAB — APTT: aPTT: 29

## 2011-08-20 LAB — ABO/RH: ABO/RH(D): AB POS

## 2011-08-24 LAB — CK TOTAL AND CKMB (NOT AT ARMC)
CK, MB: 1.6
Relative Index: INVALID
Total CK: 48

## 2011-08-24 LAB — CBC
HCT: 38
Hemoglobin: 13.1
MCHC: 34.4
Platelets: 269
RDW: 12.7

## 2011-08-24 LAB — POCT CARDIAC MARKERS: Troponin i, poc: <0.05

## 2011-08-24 LAB — BASIC METABOLIC PANEL
BUN: 16
CO2: 29
Calcium: 9.6
Glucose, Bld: 100 — ABNORMAL HIGH
Potassium: 3.5
Sodium: 135

## 2011-08-24 LAB — DIFFERENTIAL
Basophils Absolute: 0
Basophils Relative: 1
Eosinophils Relative: 3
Monocytes Absolute: 0.5

## 2011-08-24 LAB — URINALYSIS, ROUTINE W REFLEX MICROSCOPIC
Bilirubin Urine: NEGATIVE
Ketones, ur: NEGATIVE
Nitrite: NEGATIVE
Protein, ur: NEGATIVE
Urobilinogen, UA: 0.2

## 2011-08-24 LAB — CARDIAC PANEL(CRET KIN+CKTOT+MB+TROPI)
CK, MB: 2
CK, MB: 2.2
Relative Index: 2.1

## 2011-08-24 LAB — TROPONIN I: Troponin I: 0.01

## 2011-09-08 ENCOUNTER — Ambulatory Visit
Admission: RE | Admit: 2011-09-08 | Discharge: 2011-09-08 | Disposition: A | Discharge status: Home / Self Care | Payer: Medicare Other | Source: Ambulatory Visit | Attending: Internal Medicine | Admitting: Internal Medicine

## 2011-09-08 ENCOUNTER — Other Ambulatory Visit: Payer: Self-pay | Admitting: Internal Medicine

## 2011-09-08 DIAGNOSIS — R413 Other amnesia: Secondary | ICD-10-CM

## 2011-09-08 DIAGNOSIS — G252 Other specified forms of tremor: Secondary | ICD-10-CM

## 2011-09-23 ENCOUNTER — Emergency Department (HOSPITAL_COMMUNITY)
Admission: EM | Admit: 2011-09-23 | Discharge: 2011-09-24 | Disposition: A | Discharge status: Home / Self Care | Payer: Medicare Other | Attending: Emergency Medicine | Admitting: Emergency Medicine

## 2011-09-23 ENCOUNTER — Emergency Department (HOSPITAL_COMMUNITY): Payer: Medicare Other

## 2011-09-23 DIAGNOSIS — G319 Degenerative disease of nervous system, unspecified: Secondary | ICD-10-CM | POA: Insufficient documentation

## 2011-09-23 DIAGNOSIS — I498 Other specified cardiac arrhythmias: Secondary | ICD-10-CM | POA: Insufficient documentation

## 2011-09-23 DIAGNOSIS — I10 Essential (primary) hypertension: Secondary | ICD-10-CM | POA: Insufficient documentation

## 2011-09-23 DIAGNOSIS — R51 Headache: Secondary | ICD-10-CM | POA: Insufficient documentation

## 2011-09-23 LAB — DIFFERENTIAL
Basophils Relative: 0 % (ref 0–1)
Eosinophils Absolute: 0.6 10*3/uL (ref 0.0–0.7)
Lymphs Abs: 2.7 10*3/uL (ref 0.7–4.0)
Neutrophils Relative %: 57 % (ref 43–77)

## 2011-09-23 LAB — CBC
MCV: 90.2 fL (ref 78.0–100.0)
Platelets: 267 10*3/uL (ref 150–400)
RBC: 4.17 MIL/uL (ref 3.87–5.11)
WBC: 9.6 10*3/uL (ref 4.0–10.5)

## 2011-09-23 LAB — POCT I-STAT, CHEM 8
Calcium, Ion: 1.21 mmol/L (ref 1.12–1.32)
Chloride: 99 mEq/L (ref 96–112)
HCT: 39 % (ref 36.0–46.0)
Sodium: 137 mEq/L (ref 135–145)

## 2011-09-24 LAB — URINALYSIS, ROUTINE W REFLEX MICROSCOPIC
Nitrite: NEGATIVE
Specific Gravity, Urine: 1.017 (ref 1.005–1.030)
pH: 6.5 (ref 5.0–8.0)

## 2011-09-24 LAB — URINE MICROSCOPIC-ADD ON

## 2011-09-30 ENCOUNTER — Inpatient Hospital Stay (HOSPITAL_COMMUNITY)
Admission: EM | Admit: 2011-09-30 | Discharge: 2011-10-06 | DRG: 884 | Disposition: A | Discharge status: Skilled Nursing Facility | Payer: Medicare Other | Attending: Internal Medicine | Admitting: Internal Medicine

## 2011-09-30 ENCOUNTER — Encounter (HOSPITAL_COMMUNITY): Payer: Self-pay | Admitting: Emergency Medicine

## 2011-09-30 DIAGNOSIS — Z79899 Other long term (current) drug therapy: Secondary | ICD-10-CM

## 2011-09-30 DIAGNOSIS — IMO0001 Reserved for inherently not codable concepts without codable children: Secondary | ICD-10-CM | POA: Diagnosis present

## 2011-09-30 DIAGNOSIS — R404 Transient alteration of awareness: Secondary | ICD-10-CM

## 2011-09-30 DIAGNOSIS — I10 Essential (primary) hypertension: Secondary | ICD-10-CM | POA: Diagnosis present

## 2011-09-30 DIAGNOSIS — F0390 Unspecified dementia without behavioral disturbance: Principal | ICD-10-CM | POA: Diagnosis present

## 2011-09-30 DIAGNOSIS — Z7982 Long term (current) use of aspirin: Secondary | ICD-10-CM

## 2011-09-30 DIAGNOSIS — E871 Hypo-osmolality and hyponatremia: Secondary | ICD-10-CM | POA: Diagnosis present

## 2011-09-30 DIAGNOSIS — Z853 Personal history of malignant neoplasm of breast: Secondary | ICD-10-CM

## 2011-09-30 DIAGNOSIS — E876 Hypokalemia: Secondary | ICD-10-CM | POA: Diagnosis present

## 2011-09-30 HISTORY — DX: Fibromyalgia: M79.7

## 2011-09-30 HISTORY — DX: Essential (primary) hypertension: I10

## 2011-09-30 NOTE — ED Notes (Signed)
GRANDDAUGHTER REPORTS PT.'S HAS BEEN CONFUSED WITH GENERALIZED WEAKNESS AND FREQUENT FALLS FOR SEVERAL WEEKS .

## 2011-09-30 NOTE — ED Notes (Signed)
Pt presents to department for evaluation of generalized weakness and frequent falls. Per daughter pt has become more confused recently, not acting like herself. Daughter states multiple falls at home, found her lying on floor. Several bruises noted bilateral arms and R shoulder. Pt is alert, answers most questions appropriately. No neurological deficits noted. Strong equal bilateral grip strengths. No slurred speech. Family at bedside.

## 2011-10-01 ENCOUNTER — Observation Stay (HOSPITAL_COMMUNITY): Payer: Medicare Other

## 2011-10-01 ENCOUNTER — Emergency Department (HOSPITAL_COMMUNITY): Payer: Medicare Other

## 2011-10-01 ENCOUNTER — Other Ambulatory Visit: Payer: Self-pay

## 2011-10-01 ENCOUNTER — Encounter (HOSPITAL_COMMUNITY): Payer: Self-pay | Admitting: Radiology

## 2011-10-01 DIAGNOSIS — E871 Hypo-osmolality and hyponatremia: Secondary | ICD-10-CM

## 2011-10-01 DIAGNOSIS — R404 Transient alteration of awareness: Secondary | ICD-10-CM

## 2011-10-01 DIAGNOSIS — E876 Hypokalemia: Secondary | ICD-10-CM

## 2011-10-01 LAB — URINALYSIS, ROUTINE W REFLEX MICROSCOPIC
Glucose, UA: NEGATIVE mg/dL
Hgb urine dipstick: NEGATIVE
Ketones, ur: NEGATIVE mg/dL
Leukocytes, UA: NEGATIVE
Protein, ur: NEGATIVE mg/dL
pH: 6 (ref 5.0–8.0)

## 2011-10-01 LAB — CARDIAC PANEL(CRET KIN+CKTOT+MB+TROPI)
CK, MB: 6.5 ng/mL (ref 0.3–4.0)
Total CK: 695 U/L — ABNORMAL HIGH (ref 7–177)
Troponin I: <0.3 ng/mL (ref <0.30)

## 2011-10-01 LAB — CBC
HCT: 36.8 % (ref 36.0–46.0)
MCHC: 34.4 g/dL (ref 30.0–36.0)
MCV: 86.8 fL (ref 78.0–100.0)
Platelets: 251 10*3/uL (ref 150–400)
RDW: 12.5 % (ref 11.5–15.5)
RDW: 12.5 % (ref 11.5–15.5)
WBC: 10.6 10*3/uL — ABNORMAL HIGH (ref 4.0–10.5)
WBC: 12.4 10*3/uL — ABNORMAL HIGH (ref 4.0–10.5)

## 2011-10-01 LAB — COMPREHENSIVE METABOLIC PANEL
ALT: 23 U/L (ref 0–35)
ALT: 25 U/L (ref 0–35)
AST: 36 U/L (ref 0–37)
Albumin: 3.4 g/dL — ABNORMAL LOW (ref 3.5–5.2)
Albumin: 3.5 g/dL (ref 3.5–5.2)
Alkaline Phosphatase: 65 U/L (ref 39–117)
BUN: 12 mg/dL (ref 6–23)
CO2: 30 mEq/L (ref 19–32)
Chloride: 93 mEq/L — ABNORMAL LOW (ref 96–112)
GFR calc non Af Amer: 60 mL/min — ABNORMAL LOW (ref >90)
Potassium: 3.4 mEq/L — ABNORMAL LOW (ref 3.5–5.1)
Sodium: 130 mEq/L — ABNORMAL LOW (ref 135–145)
Sodium: 139 mEq/L (ref 135–145)
Total Bilirubin: 0.6 mg/dL (ref 0.3–1.2)
Total Protein: 6.9 g/dL (ref 6.0–8.3)

## 2011-10-01 LAB — OSMOLALITY, URINE: Osmolality, Ur: 160 mOsm/kg — ABNORMAL LOW (ref 390–1090)

## 2011-10-01 LAB — RAPID URINE DRUG SCREEN, HOSP PERFORMED
Barbiturates: NOT DETECTED
Tetrahydrocannabinol: NOT DETECTED

## 2011-10-01 LAB — AMMONIA: Ammonia: <10 umol/L — ABNORMAL LOW (ref 11–60)

## 2011-10-01 LAB — DIFFERENTIAL
Basophils Absolute: 0 10*3/uL (ref 0.0–0.1)
Basophils Relative: 0 % (ref 0–1)
Lymphocytes Relative: 22 % (ref 12–46)
Neutro Abs: 8 10*3/uL — ABNORMAL HIGH (ref 1.7–7.7)
Neutrophils Relative %: 65 % (ref 43–77)

## 2011-10-01 LAB — TSH: TSH: 1.416 u[IU]/mL (ref 0.350–4.500)

## 2011-10-01 LAB — POCT I-STAT TROPONIN I: Troponin i, poc: 0.02 ng/mL (ref 0.00–0.08)

## 2011-10-01 MED ORDER — POTASSIUM CHLORIDE 20 MEQ PO PACK
40.0000 meq | PACK | Freq: Once | ORAL | Status: DC
Start: 2011-10-01 — End: 2011-10-01
  Filled 2011-10-01: qty 2

## 2011-10-01 MED ORDER — ALPRAZOLAM 0.25 MG PO TABS
0.2500 mg | ORAL_TABLET | Freq: Every evening | ORAL | Status: DC | PRN
  Administered 2011-10-02: 0.25 mg via ORAL
  Filled 2011-10-01: qty 1

## 2011-10-01 MED ORDER — ACETAMINOPHEN 650 MG RE SUPP: 650.0000 mg | Freq: Four times a day (QID) | RECTAL | Status: DC | PRN

## 2011-10-01 MED ORDER — METOPROLOL TARTRATE 25 MG PO TABS
100.0000 mg | ORAL_TABLET | Freq: Once | ORAL | Status: AC
  Administered 2011-10-01: 100 mg via ORAL
  Filled 2011-10-01: qty 4

## 2011-10-01 MED ORDER — METOPROLOL TARTRATE 100 MG PO TABS
100.0000 mg | ORAL_TABLET | Freq: Two times a day (BID) | ORAL | Status: DC
  Administered 2011-10-01 – 2011-10-02 (×4): 100 mg via ORAL
  Filled 2011-10-01 (×6): qty 1

## 2011-10-01 MED ORDER — OLMESARTAN MEDOXOMIL 20 MG PO TABS: 20.0000 mg | ORAL_TABLET | Freq: Every day | ORAL | Status: DC

## 2011-10-01 MED ORDER — DONEPEZIL HCL 5 MG PO TABS
5.0000 mg | ORAL_TABLET | Freq: Once | ORAL | Status: AC
  Administered 2011-10-01: 5 mg via ORAL
  Filled 2011-10-01: qty 1

## 2011-10-01 MED ORDER — SODIUM CHLORIDE 0.9 % IV SOLN: INTRAVENOUS | Status: DC

## 2011-10-01 MED ORDER — ACETAMINOPHEN 325 MG PO TABS
650.0000 mg | ORAL_TABLET | Freq: Four times a day (QID) | ORAL | Status: DC | PRN
  Administered 2011-10-02 – 2011-10-05 (×3): 650 mg via ORAL
  Filled 2011-10-01 (×3): qty 2

## 2011-10-01 MED ORDER — ASPIRIN EC 81 MG PO TBEC
81.0000 mg | DELAYED_RELEASE_TABLET | Freq: Every day | ORAL | Status: DC
  Administered 2011-10-01 – 2011-10-06 (×6): 81 mg via ORAL
  Filled 2011-10-01 (×6): qty 1

## 2011-10-01 MED ORDER — ONDANSETRON HCL 4 MG/2ML IJ SOLN: 4.0000 mg | Freq: Four times a day (QID) | INTRAMUSCULAR | Status: DC | PRN

## 2011-10-01 MED ORDER — POTASSIUM CHLORIDE IN NACL 20-0.9 MEQ/L-% IV SOLN
INTRAVENOUS | Status: DC
  Administered 2011-10-01: 05:00:00 via INTRAVENOUS
  Filled 2011-10-01 (×5): qty 1000

## 2011-10-01 MED ORDER — OLMESARTAN MEDOXOMIL 20 MG PO TABS
20.0000 mg | ORAL_TABLET | Freq: Every day | ORAL | Status: DC
Start: 2011-10-01 — End: 2011-10-02
  Administered 2011-10-01: 20 mg via ORAL
  Filled 2011-10-01 (×2): qty 1

## 2011-10-01 MED ORDER — POTASSIUM CHLORIDE CRYS ER 20 MEQ PO TBCR
40.0000 meq | EXTENDED_RELEASE_TABLET | Freq: Once | ORAL | Status: AC
  Administered 2011-10-01: 40 meq via ORAL
  Filled 2011-10-01: qty 2

## 2011-10-01 MED ORDER — POTASSIUM CHLORIDE CRYS ER 20 MEQ PO TBCR
40.0000 meq | EXTENDED_RELEASE_TABLET | Freq: Once | ORAL | Status: DC
  Filled 2011-10-01: qty 2

## 2011-10-01 MED ORDER — HYDRALAZINE HCL 20 MG/ML IJ SOLN
10.0000 mg | INTRAMUSCULAR | Status: DC | PRN
  Administered 2011-10-01: 10 mg via INTRAVENOUS
  Filled 2011-10-01: qty 0.5

## 2011-10-01 MED ORDER — ONDANSETRON HCL 4 MG PO TABS: 4.0000 mg | ORAL_TABLET | Freq: Four times a day (QID) | ORAL | Status: DC | PRN

## 2011-10-01 MED ORDER — LORAZEPAM 2 MG/ML IJ SOLN
1.0000 mg | Freq: Four times a day (QID) | INTRAMUSCULAR | Status: DC | PRN
  Administered 2011-10-01 – 2011-10-03 (×3): 1 mg via INTRAVENOUS
  Filled 2011-10-01 (×3): qty 1

## 2011-10-01 MED ORDER — SODIUM CHLORIDE 0.9 % IV BOLUS (SEPSIS)
1000.0000 mL | Freq: Once | INTRAVENOUS | Status: AC
  Administered 2011-10-01: 1000 mL via INTRAVENOUS

## 2011-10-01 NOTE — Progress Notes (Signed)
EEG complete

## 2011-10-01 NOTE — ED Provider Notes (Signed)
History     CSN: [id number] Arrival date & time: 09/30/2011  8:04 PM   First MD Initiated Contact with Patient 09/30/11 2324      Chief Complaint  Patient presents with  . Weakness    (Consider location/radiation/quality/duration/timing/severity/associated sxs/prior treatment) HPI  The patient is a 77 year old female who presents today with her granddaughter because of increasing falls and change in mental status. Her granddaughter reports that the patient's mental status has changed approximately 2 weeks ago. There were no acute events at that time. Patient does have donepezil as a listed medication. She has not had any recent fevers. Her granddaughter states that the patient normally ambulates with a walker and she's had increasing falls over the past 2 weeks. Her granddaughter says she's unable to take care of her as this morning the patient was found on the ground on a nightstand and the granddaughter can no longer pick her up because of back pain. Patient was previously cared for by one of her sisters up until 2 weeks ago when this individual had a spouse who was diagnosed with brain cancer. Patient does family she is had declined since August and 08-09-23 when 2 of her children died. She denies any pain. She is oriented to self and location but not to the year. She apparently self administers her medications and told 2 weeks ago. Her blood pressure was elevated this evening. However the patient not taken her 5 PM or 8 PM medications and was seen after 11 PM this evening. Per family they are considering placement at this time given her inability to care for the patient. Her younger sister is in the process of being a sinus health care power of attorney. History is somewhat limited given patient's all terminal status. Of note the patient's granddaughter does report that her symptoms worsen at night and improves during the day. She cannot point out any particular neurologic deficits but just  notes that her grandmother is not right. There are no other associated or modifying factors. Past Medical History  Diagnosis Date  . Cancer   . Hypertension   . Fibromyalgia     Past Surgical History  Procedure Date  . Breast surgery RIGHT LUMPECTOMY    History reviewed. No pertinent family history.  History  Substance Use Topics  . Smoking status: Former Games developer  . Smokeless tobacco: Not on file  . Alcohol Use: No    OB History    Grav Para Term Preterm Abortions TAB SAB Ect Mult Living                  Review of Systems  Unable to perform ROS: Mental status change  All other systems reviewed and are negative.    Allergies  Penicillins and Latex  Home Medications   Current Outpatient Rx  Name Route Sig Dispense Refill  . ALPRAZOLAM 0.25 MG PO TABS Oral Take 0.25 mg by mouth at bedtime as needed.      . ASPIRIN EC 81 MG PO TBEC Oral Take 81 mg by mouth daily.      Marland Kitchen CEFACLOR 250 MG PO CAPS Oral Take 500 mg by mouth daily.      . DONEPEZIL HCL 5 MG PO TABS Oral Take 5 mg by mouth at bedtime as needed.      Marland Kitchen METOPROLOL TARTRATE 100 MG PO TABS Oral Take 100 mg by mouth 2 (two) times daily.      Marland Kitchen OLMESARTAN MEDOXOMIL 20 MG PO TABS Oral  Take 20 mg by mouth daily.      Marland Kitchen OVER THE COUNTER MEDICATION  Citracal (Calcium Citrate + D3     . SERTRALINE HCL 50 MG PO TABS Oral Take 50 mg by mouth daily.      . TRAMADOL-ACETAMINOPHEN 37.5-325 MG PO TABS Oral Take 1 tablet by mouth 3 (three) times daily. pain      BP 186/65  Pulse 57  Temp(Src) 98.2 F (36.8 C) (Oral)  Resp 22  SpO2 96%  Physical Exam  Nursing note and vitals reviewed. Constitutional: She appears well-developed and well-nourished. No distress.  HENT:  Head: Normocephalic and atraumatic.  Eyes: Conjunctivae and EOM are normal. Pupils are equal, round, and reactive to light.  Neck: Normal range of motion.  Cardiovascular: Regular rhythm, normal heart sounds and intact distal pulses.  Bradycardia  present.  Exam reveals no gallop and no friction rub.   No murmur heard. Pulmonary/Chest: Effort normal and breath sounds normal. No respiratory distress. She has no wheezes. She has no rales. She exhibits no tenderness.  Abdominal: Soft. Bowel sounds are normal. She exhibits no distension. There is no tenderness. There is no rebound and no guarding.  Musculoskeletal: Normal range of motion. She exhibits no edema and no tenderness.  Neurological: She is alert. No cranial nerve deficit. She exhibits normal muscle tone. Coordination normal.       Patient is oriented to self and location but not to the year  Skin: Skin is warm and dry. No rash noted.  Psychiatric: She has a normal mood and affect.    ED Course  Procedures (including critical care time)  Labs Reviewed  CBC - Abnormal; Notable for the following:    WBC 12.4 (*)    All other components within normal limits  DIFFERENTIAL - Abnormal; Notable for the following:    Neutro Abs 8.0 (*)    Monocytes Absolute 1.2 (*)    All other components within normal limits  COMPREHENSIVE METABOLIC PANEL - Abnormal; Notable for the following:    Sodium 130 (*)    Potassium 2.6 (*)    Chloride 93 (*)    GFR calc non Af Amer 60 (*)    GFR calc Af Amer 70 (*)    All other components within normal limits  URINALYSIS, ROUTINE W REFLEX MICROSCOPIC - Abnormal; Notable for the following:    Appearance HAZY (*)    All other components within normal limits  AMMONIA  POCT I-STAT TROPONIN I  I-STAT TROPONIN I   Dg Chest 2 View  10/01/2011  *RADIOLOGY REPORT*  Clinical Data: Altered level of consciousness  CHEST - 2 VIEW  Comparison: 12/25/2010  Findings: Mild cardiomegaly.  Normal pulmonary vascularity.  Clear lungs.  No pneumothorax.  No pleural effusion.  IMPRESSION: No active cardiopulmonary disease.  Original Report Authenticated By: Donavan Burnet, M.D.   Ct Head Wo Contrast  10/01/2011  *RADIOLOGY REPORT*  Clinical Data: Altered level of  consciousness  CT HEAD WITHOUT CONTRAST  Technique:  Contiguous axial images were obtained from the base of the skull through the vertex without contrast.  Comparison: 09/23/2011  Findings: Chronic ischemic changes and global atrophy are noted. No mass effect, midline shift, or acute intracranial hemorrhage. Mastoid air cells are clear.  Visualized paranasal sinuses are clear.  IMPRESSION: No acute intracranial pathology.  Chronic changes.  Original Report Authenticated By: Donavan Burnet, M.D.     1. Hyponatremia   2. Hypokalemia   3. Altered level of  consciousness     Date: 10/01/2011  Rate: 50  Rhythm: sinus bradycardia  QRS Axis: normal  Intervals: normal  ST/T Wave abnormalities: normal  Conduction Disutrbances:none  Narrative Interpretation: Sinus bradycardia seen previously  Old EKG Reviewed: unchanged   MDM  The patient was examined by myself and was a pleasant elderly female. She was not oriented to the year but was in no distress. Patient's blood pressure was elevated to 186/86 but the patient had not had her afternoon or evening medications which included metoprolol 100 mg and Benicar 40 mg. Patient has been having additional medications added recently given elevated blood pressures. Patient's family brought her here today and they're primary care physician, Dr. Gerlene Burdock pain, head instructed that the patient would likely need to be admitted given her falls. Patient's workup showed a negative urinalysis, mild hyponatremia to 1:30 with hypokalemia to 2.6, negative troponin, sinus bradycardia as seen on prior EKG, a mild elevation of her white count at 12.4, negative chest x-ray, and a negative head CT. Patient was given a liter of normal saline given her hyponatremia. She was also given 40 mEq of potassium by mouth. She was given a dose of her home medication of metoprolol and Benicar. I spoke to the hospitalist who will accept the patient for admission. Patient was admitted to the triad  team 2. She will be admitted to a telemetry bed for further monitoring and consideration of possible nursing facility placement as well as correction of her electrolyte abnormalities.       Cyndra Numbers, MD 10/01/11 [phone]

## 2011-10-01 NOTE — ED Notes (Signed)
Pt's urine have a foul odor and appearance of specimen is cloudy and dark yellow

## 2011-10-01 NOTE — Progress Notes (Signed)
Subjective: Patient seen and examined this morning. Denies any symptoms. She is aware of being in the hospital but is unclear why she was admitted.  Objective:  Vital signs in last 24 hours:  Filed Vitals:   10/01/11 0125 10/01/11 0339 10/01/11 0500 10/01/11 1438  BP: 186/65 189/88 152/62 196/90  Pulse:  69  91  Temp: 98.2 F (36.8 C) 97.9 F (36.6 C)  98 F (36.7 C)  TempSrc: Oral Oral  Oral  Resp: 22 18  18   Height:  5\' 2"  (1.575 m)    Weight:  74.98 kg (165 lb 4.8 oz)    SpO2: 96% 95%  95%    Intake/Output from previous day:   Intake/Output Summary (Last 24 hours) at 10/01/11 1617 Last data filed at 10/01/11 1300  Gross per 24 hour  Intake    220 ml  Output   1951 ml  Net  -1731 ml    Physical Exam:  General: elderly female lying in bed in no acute distress. HEENT: no pallor, no icterus, moist oral mucosa, no JVD, no lymphadenopathy Heart: Normal  s1 &s2  Regular rate and rhythm, without murmurs, rubs, gallops. Lungs: Clear to auscultation bilaterally. Abdomen: Soft, nontender, nondistended, positive bowel sounds. Extremities: No clubbing cyanosis or edema with positive pedal pulses. Neuro: Alert, awake, oriented x2, nonfocal.   Lab Results:  Basic Metabolic Panel:    Component Value Date/Time   NA 139 10/01/2011 0645   K 3.4* 10/01/2011 0645   CL 102 10/01/2011 0645   CO2 29 10/01/2011 0645   BUN 12 10/01/2011 0645   CREATININE 0.79 10/01/2011 0645   GLUCOSE 107* 10/01/2011 0645   CALCIUM 9.2 10/01/2011 0645   CBC:    Component Value Date/Time   WBC 10.6* 10/01/2011 0645   HGB 12.8 10/01/2011 0645   HGB 12.7 02/02/2011 1108   HCT 36.8 10/01/2011 0645   HCT 36.9 02/02/2011 1108   PLT 272 10/01/2011 0645   PLT 301 02/02/2011 1108   MCV 86.8 10/01/2011 0645   MCV 89.7 02/02/2011 1108   NEUTROABS 8.0* 09/30/2011 2352   LYMPHSABS 2.8 09/30/2011 2352   MONOABS 1.2* 09/30/2011 2352   EOSABS 0.5 09/30/2011 2352   EOSABS 0.4 02/02/2011 1108   BASOSABS 0.0 09/30/2011  2352   BASOSABS 0.0 02/02/2011 1108    No results found for this or any previous visit (from the past 240 hour(s)).  Studies/Results: Dg Chest 2 View  10/01/2011  *RADIOLOGY REPORT*  Clinical Data: Altered level of consciousness  CHEST - 2 VIEW  Comparison: 12/25/2010  Findings: Mild cardiomegaly.  Normal pulmonary vascularity.  Clear lungs.  No pneumothorax.  No pleural effusion.  IMPRESSION: No active cardiopulmonary disease.  Original Report Authenticated By: Donavan Burnet, M.D.   Ct Head Wo Contrast  10/01/2011  *RADIOLOGY REPORT*  Clinical Data: Altered level of consciousness  CT HEAD WITHOUT CONTRAST  Technique:  Contiguous axial images were obtained from the base of the skull through the vertex without contrast.  Comparison: 09/23/2011  Findings: Chronic ischemic changes and global atrophy are noted. No mass effect, midline shift, or acute intracranial hemorrhage. Mastoid air cells are clear.  Visualized paranasal sinuses are clear.  IMPRESSION: No acute intracranial pathology.  Chronic changes.  Original Report Authenticated By: Donavan Burnet, M.D.    Medications: Scheduled Meds:   . aspirin EC  81 mg Oral Daily  . donepezil  5 mg Oral Once  . metoprolol  100 mg Oral BID  . metoprolol  tartrate  100 mg Oral Once  . olmesartan  20 mg Oral Daily  . potassium chloride  40 mEq Oral Once  . potassium chloride  40 mEq Oral Once  . sodium chloride  1,000 mL Intravenous Once  . DISCONTD: olmesartan  20 mg Oral Daily   Continuous Infusions:   . sodium chloride    . 0.9 % NaCl with KCl 20 mEq / L 75 mL/hr at 10/01/11 0509   PRN Meds:.acetaminophen, acetaminophen, ALPRAZolam, hydrALAZINE, LORazepam, ondansetron (ZOFRAN) IV, ondansetron  Assessment:  41 female with hx of dementia, HTN, ca breast s/p mastectomy brought in by family for confusion for past 2 weeks.   PLAN:  Altered mental status initial w/up for infection including CBC, UA, CXR and head CT unremarkable.  she was  noted to have mild hyponatremia and hypokalemia and likely an underlying dehydration which could trigger thrse symptms Patient also noted for elevated CPK and CKMB with negative troponins  she was given IV NS bolus on admission and kcl replenished. Follow up labs in am showed improved Na and k. i will continue with gentle hydration.  workup for reversible cause of worsening dementia including TSH, B12 and RPR so far negative. MRI brain to evaluate potential CVA vs brain mets pending. EEG ordered on admission and will follow. Given HTN and hypokalemia a renin -aldosterone level was ordered and will follow.  -holding zoloft and tramadol - Cont  aricept  HTN Cont metoprolol and olmesartan  prn hydralazine for elevated BP  Diet: cardiac   LOS: 1 day   Jaymee Tilson 10/01/2011, 4:17 PM

## 2011-10-01 NOTE — Procedures (Signed)
EEG number [id number].  This routine EEG was requested in this 77 year old woman with altered mental status, increased confusion, has a history of dementia and breast cancer.  Medications include lorazepam and Aricept.  The EEG was done with the patient  briefly awake but predominantly drowsy and asleep.  During periods of maximal wakefulness background activities were composed of low amplitude beta activities that were symmetric.  There did appear to be short runs of posteriorly dominant alpha activity sometimes in the 9-10 cycle per second range.  However this was not clearly reactive to eye opening.  Photic stimulation produced a symmetric driving response.  The patient was not hyperventilated.  The patient spent much of the time asleep.  This is marked by symmetric vertex sharp waves, sleep spindles, and K complexes, as well as background of slower,  poorly organized delta and theta activities.  CLINICAL INTERPRETATION:  This routine EEG done with the patient briefly awake but predominantly drowsy is normal for drowsy state.          ______________________________ Denton Meek, MD    [id number] D:  10/01/2011 18:05:48  T:  10/01/2011 18:18:33  Job #:  [id number]

## 2011-10-01 NOTE — H&P (Signed)
Kaylee Turner is an 77 y.o. female.   Chief Complaint: Altered mental staus HPI: 77 year old female with H/O dementia, hypertension, CA Breast s/p mastectomy was brought into ER by family as patient was founf increasingly confused over last two weeks. Patient had CT head which did not show anything acute. Patient is afebrile and CXR and UA does not show anything acute. The only new medication started recently as per family was benicar. Patient is non focal. Does not complain any pain or shortness of breath. Was found to be hyponatremic and hpokalemic. I obtained all the history from ER physician Dr Alto Denver who had discussed with the family. I am unable to reach the family at this time.  Past Medical History  Diagnosis Date  . Cancer   . Hypertension   . Fibromyalgia     Past Surgical History  Procedure Date  . Breast surgery RIGHT LUMPECTOMY    Family History  Problem Relation Age of Onset  . Crohn's disease Daughter   . COPD Daughter    Social History:  reports that she has quit smoking. She does not have any smokeless tobacco history on file. She reports that she does not drink alcohol or use illicit drugs.  Allergies:  Allergies  Allergen Reactions  . Penicillins   . Latex Rash    Causes blisters     Medications Prior to Admission  Medication Dose Route Frequency Provider Last Rate Last Dose  . 0.9 % NaCl with KCl 20 mEq/ L  infusion   Intravenous Continuous Eduard Clos      . acetaminophen (TYLENOL) tablet 650 mg  650 mg Oral Q6H PRN Eduard Clos       Or  . acetaminophen (TYLENOL) suppository 650 mg  650 mg Rectal Q6H PRN Eduard Clos      . ALPRAZolam Prudy Feeler) tablet 0.25 mg  0.25 mg Oral QHS PRN Eduard Clos      . aspirin EC tablet 81 mg  81 mg Oral Daily Eduard Clos      . donepezil (ARICEPT) tablet 5 mg  5 mg Oral Once Eduard Clos      . hydrALAZINE (APRESOLINE) injection 10 mg  10 mg Intravenous Q4H PRN Eduard Clos      . LORazepam (ATIVAN) injection 1 mg  1 mg Intravenous Q6H PRN Eduard Clos      . metoprolol (LOPRESSOR) tablet 100 mg  100 mg Oral BID Eduard Clos      . metoprolol tartrate (LOPRESSOR) tablet 100 mg  100 mg Oral Once Cyndra Numbers, MD   100 mg at 10/01/11 0202  . olmesartan (BENICAR) tablet 20 mg  20 mg Oral Daily Meagan Hunt, MD      . olmesartan (BENICAR) tablet 20 mg  20 mg Oral Daily Eduard Clos      . ondansetron (ZOFRAN) tablet 4 mg  4 mg Oral Q6H PRN Eduard Clos       Or  . ondansetron (ZOFRAN) injection 4 mg  4 mg Intravenous Q6H PRN Eduard Clos      . potassium chloride SA (K-DUR,KLOR-CON) CR tablet 40 mEq  40 mEq Oral Once Cyndra Numbers, MD   40 mEq at 10/01/11 0158  . sodium chloride 0.9 % bolus 1,000 mL  1,000 mL Intravenous Once Cyndra Numbers, MD   1,000 mL at 10/01/11 0157   No current outpatient prescriptions on file as of 10/01/2011.  Results for orders placed during the hospital encounter of 09/30/11 (from the past 48 hour(s))  URINALYSIS, ROUTINE W REFLEX MICROSCOPIC     Status: Abnormal   Collection Time   09/30/11 11:49 PM      Component Value Range Comment   Color, Urine YELLOW  YELLOW     Appearance HAZY (*) CLEAR     Specific Gravity, Urine 1.014  1.005 - 1.030     pH 6.0  5.0 - 8.0     Glucose, UA NEGATIVE  NEGATIVE (mg/dL)    Hgb urine dipstick NEGATIVE  NEGATIVE     Bilirubin Urine NEGATIVE  NEGATIVE     Ketones, ur NEGATIVE  NEGATIVE (mg/dL)    Protein, ur NEGATIVE  NEGATIVE (mg/dL)    Urobilinogen, UA 0.2  0.0 - 1.0 (mg/dL)    Nitrite NEGATIVE  NEGATIVE     Leukocytes, UA NEGATIVE  NEGATIVE  MICROSCOPIC NOT DONE ON URINES WITH NEGATIVE PROTEIN, BLOOD, LEUKOCYTES, NITRITE, OR GLUCOSE <1000 mg/dL.  CBC     Status: Abnormal   Collection Time   09/30/11 11:52 PM      Component Value Range Comment   WBC 12.4 (*) 4.0 - 10.5 (K/uL)    RBC 4.18  3.87 - 5.11 (MIL/uL)    Hemoglobin 12.5  12.0 - 15.0  (g/dL)    HCT 16.1  09.6 - 04.5 (%)    MCV 86.8  78.0 - 100.0 (fL)    MCH 29.9  26.0 - 34.0 (pg)    MCHC 34.4  30.0 - 36.0 (g/dL)    RDW 40.9  81.1 - 91.4 (%)    Platelets 251  150 - 400 (K/uL)   DIFFERENTIAL     Status: Abnormal   Collection Time   09/30/11 11:52 PM      Component Value Range Comment   Neutrophils Relative 65  43 - 77 (%)    Neutro Abs 8.0 (*) 1.7 - 7.7 (K/uL)    Lymphocytes Relative 22  12 - 46 (%)    Lymphs Abs 2.8  0.7 - 4.0 (K/uL)    Monocytes Relative 9  3 - 12 (%)    Monocytes Absolute 1.2 (*) 0.1 - 1.0 (K/uL)    Eosinophils Relative 4  0 - 5 (%)    Eosinophils Absolute 0.5  0.0 - 0.7 (K/uL)    Basophils Relative 0  0 - 1 (%)    Basophils Absolute 0.0  0.0 - 0.1 (K/uL)   COMPREHENSIVE METABOLIC PANEL     Status: Abnormal   Collection Time   09/30/11 11:52 PM      Component Value Range Comment   Sodium 130 (*) 135 - 145 (mEq/L)    Potassium 2.6 (*) 3.5 - 5.1 (mEq/L)    Chloride 93 (*) 96 - 112 (mEq/L)    CO2 30  19 - 32 (mEq/L)    Glucose, Bld 98  70 - 99 (mg/dL)    BUN 16  6 - 23 (mg/dL)    Creatinine, Ser 7.82  0.50 - 1.10 (mg/dL)    Calcium 9.5  8.4 - 10.5 (mg/dL)    Total Protein 6.8  6.0 - 8.3 (g/dL)    Albumin 3.5  3.5 - 5.2 (g/dL)    AST 36  0 - 37 (U/L)    ALT 23  0 - 35 (U/L)    Alkaline Phosphatase 63  39 - 117 (U/L)    Total Bilirubin 0.6  0.3 - 1.2 (mg/dL)  GFR calc non Af Amer 60 (*) >90 (mL/min)    GFR calc Af Amer 70 (*) >90 (mL/min)   AMMONIA     Status: Normal   Collection Time   09/30/11 11:53 PM      Component Value Range Comment   Ammonia 12  11 - 60 (umol/L)   POCT I-STAT TROPONIN I     Status: Normal   Collection Time   10/01/11 12:12 AM      Component Value Range Comment   Troponin i, poc 0.02  0.00 - 0.08 (ng/mL)    Comment 3             Dg Chest 2 View  10/01/2011  *RADIOLOGY REPORT*  Clinical Data: Altered level of consciousness  CHEST - 2 VIEW  Comparison: 12/25/2010  Findings: Mild cardiomegaly.  Normal pulmonary  vascularity.  Clear lungs.  No pneumothorax.  No pleural effusion.  IMPRESSION: No active cardiopulmonary disease.  Original Report Authenticated By: Donavan Burnet, M.D.   Ct Head Wo Contrast  10/01/2011  *RADIOLOGY REPORT*  Clinical Data: Altered level of consciousness  CT HEAD WITHOUT CONTRAST  Technique:  Contiguous axial images were obtained from the base of the skull through the vertex without contrast.  Comparison: 09/23/2011  Findings: Chronic ischemic changes and global atrophy are noted. No mass effect, midline shift, or acute intracranial hemorrhage. Mastoid air cells are clear.  Visualized paranasal sinuses are clear.  IMPRESSION: No acute intracranial pathology.  Chronic changes.  Original Report Authenticated By: Donavan Burnet, M.D.    Review of Systems  Constitutional: Negative.   HENT: Negative.   Eyes: Negative.   Respiratory: Negative.   Cardiovascular: Negative.   Gastrointestinal: Negative.   Genitourinary: Negative.   Musculoskeletal: Negative.   Skin: Negative.   Endo/Heme/Allergies: Negative.   Psychiatric/Behavioral: Negative.     Blood pressure 189/88, pulse 69, temperature 97.9 F (36.6 C), temperature source Oral, resp. rate 18, weight 74.98 kg (165 lb 4.8 oz), SpO2 95.00%. Physical Exam  Constitutional: She appears well-developed and well-nourished.  HENT:  Head: Normocephalic and atraumatic.  Eyes: Conjunctivae and EOM are normal. Pupils are equal, round, and reactive to light.  Neck: Normal range of motion. Neck supple.  Cardiovascular: Normal rate and regular rhythm.   Respiratory: Effort normal and breath sounds normal.  GI: Soft. Bowel sounds are normal.  Musculoskeletal: Normal range of motion.  Neurological:       Oriented to her name only Moves upper lower limbs Follows commands     Assessment/Plan 1)Altered mental status 2)Hyponatremia and Hypokalemia 3)Hypertension 4)H/O dementia. 5)H/O CA Breast  Plan:  Admit to telemetry Will  check TSH, RPR, MRI Brain, ammonia level, EEG. Will hold off tramadol and zoloft for now. Patient was on cefaclor. We need to find from family what reason patient was on it. Will gently hydrate with fluids and replace KCL. Recheck BMET and closely follow her NA and K. As patient is hypertensive and hypokalemic will check renin and aldosterone levels Patient will be on PRN iv hydralazine in addition to her other antihypertensives. At this time patient is afebrile and there no definite indications for Lumbar puncture,  Cassi Jenne N. 10/01/2011, 3:46 AM

## 2011-10-01 NOTE — Progress Notes (Signed)
UTILIZATION REVIEW COMPLETE Willa Rough 10/01/2011 [phone] OR [phone]

## 2011-10-01 NOTE — ED Notes (Signed)
Pt transported to x-ray. Family remains at bedside. Vital signs stable.

## 2011-10-01 NOTE — Progress Notes (Addendum)
Pt's admission BP is 189/83- Dr. Toniann Fail on floor notified him on result. He advised he would order PRN Hyrdralazine.

## 2011-10-01 NOTE — ED Notes (Signed)
Pt resting quietly at the time. Family remains at the bedside. Vital signs stable. Pt remains alert but confused.

## 2011-10-02 ENCOUNTER — Other Ambulatory Visit (HOSPITAL_COMMUNITY): Payer: Self-pay | Admitting: Respiratory Therapy

## 2011-10-02 DIAGNOSIS — R4182 Altered mental status, unspecified: Secondary | ICD-10-CM

## 2011-10-02 LAB — BASIC METABOLIC PANEL
Calcium: 9.4 mg/dL (ref 8.4–10.5)
Chloride: 102 mEq/L (ref 96–112)
Creatinine, Ser: 0.87 mg/dL (ref 0.50–1.10)
GFR calc Af Amer: 72 mL/min — ABNORMAL LOW (ref >90)
GFR calc non Af Amer: 62 mL/min — ABNORMAL LOW (ref >90)

## 2011-10-02 LAB — CARDIAC PANEL(CRET KIN+CKTOT+MB+TROPI)
CK, MB: 3.8 ng/mL (ref 0.3–4.0)
Troponin I: <0.3 ng/mL (ref <0.30)

## 2011-10-02 MED ORDER — AMLODIPINE BESYLATE 5 MG PO TABS
5.0000 mg | ORAL_TABLET | Freq: Every day | ORAL | Status: DC
  Administered 2011-10-02: 5 mg via ORAL
  Filled 2011-10-02 (×2): qty 1

## 2011-10-02 MED ORDER — OLMESARTAN MEDOXOMIL 40 MG PO TABS
40.0000 mg | ORAL_TABLET | Freq: Every day | ORAL | Status: DC
  Administered 2011-10-02 (×2): 20 mg via ORAL
  Filled 2011-10-02 (×2): qty 1

## 2011-10-02 MED ORDER — POTASSIUM CHLORIDE CRYS ER 20 MEQ PO TBCR
40.0000 meq | EXTENDED_RELEASE_TABLET | Freq: Once | ORAL | Status: AC
  Administered 2011-10-02: 40 meq via ORAL

## 2011-10-02 MED ORDER — POTASSIUM CHLORIDE 20 MEQ PO PACK
40.0000 meq | PACK | Freq: Once | ORAL | Status: DC
  Filled 2011-10-02: qty 2

## 2011-10-02 MED ORDER — HYDRALAZINE HCL 20 MG/ML IJ SOLN
20.0000 mg | INTRAMUSCULAR | Status: DC | PRN
  Administered 2011-10-02 – 2011-10-03 (×2): 20 mg via INTRAVENOUS
  Filled 2011-10-02 (×2): qty 1

## 2011-10-02 NOTE — Progress Notes (Signed)
Subjective: Patient seen and examined this morning. Still quite confused. Spoke with granddaughter [name] at bedside. She feels although patient appears to have improved slightly she is still very confused.Marland Kitchen as per her vision worse fully independent until 2 months ago. She was living independently, driving by herself and was also taken care off her daughter her who was in hospice. Last 2 months patient has been having frequent falls, and for the last 3-4 weeks she has not been eating properly and also not taking her medications. She has been more confused, delusional about deceased family and sometimes getting up in the middle of the night to move furniture.  Patient noted to be hypertensive overnight  Objective:  Vital signs in last 24 hours:  Filed Vitals:   10/01/11 1700 10/01/11 2058 10/02/11 0614 10/02/11 1326  BP: 162/68 152/62 193/91 181/90  Pulse:  78 64 68  Temp:  98.2 F (36.8 C) 98 F (36.7 C) 98.1 F (36.7 C)  TempSrc:  Oral Oral Oral  Resp:  20 18 18   Height:      Weight:      SpO2:  96% 95% 95%    Intake/Output from previous day:   Intake/Output Summary (Last 24 hours) at 10/02/11 1551 Last data filed at 10/02/11 1050  Gross per 24 hour  Intake    120 ml  Output    552 ml  Net   -432 ml    Physical Exam:   General: elderly female lying in bed in no acute distress.  HEENT: no pallor, no icterus, moist oral mucosa, no JVD, no lymphadenopathy  Heart: Normal s1 &s2 Regular rate and rhythm, without murmurs, rubs, gallops.  Lungs: Clear to auscultation bilaterally.  Abdomen: Soft, nontender, nondistended, positive bowel sounds.  Extremities: No clubbing cyanosis or edema with positive pedal pulses.  Neuro: Alert, awake, oriented x1 [to place only], nonfocal.   Lab Results:  Basic Metabolic Panel:    Component Value Date/Time   NA 138 10/02/2011 0525   K 3.0* 10/02/2011 0525   CL 102 10/02/2011 0525   CO2 28 10/02/2011 0525   BUN 15 10/02/2011 0525   CREATININE  0.87 10/02/2011 0525   GLUCOSE 92 10/02/2011 0525   CALCIUM 9.4 10/02/2011 0525   CBC:    Component Value Date/Time   WBC 10.6* 10/01/2011 0645   HGB 12.8 10/01/2011 0645   HGB 12.7 02/02/2011 1108   HCT 36.8 10/01/2011 0645   HCT 36.9 02/02/2011 1108   PLT 272 10/01/2011 0645   PLT 301 02/02/2011 1108   MCV 86.8 10/01/2011 0645   MCV 89.7 02/02/2011 1108   NEUTROABS 8.0* 09/30/2011 2352   LYMPHSABS 2.8 09/30/2011 2352   MONOABS 1.2* 09/30/2011 2352   EOSABS 0.5 09/30/2011 2352   EOSABS 0.4 02/02/2011 1108   BASOSABS 0.0 09/30/2011 2352   BASOSABS 0.0 02/02/2011 1108    No results found for this or any previous visit (from the past 240 hour(s)).  Studies/Results: Dg Chest 2 View  10/01/2011  *RADIOLOGY REPORT*  Clinical Data: Altered level of consciousness  CHEST - 2 VIEW  Comparison: 12/25/2010  Findings: Mild cardiomegaly.  Normal pulmonary vascularity.  Clear lungs.  No pneumothorax.  No pleural effusion.  IMPRESSION: No active cardiopulmonary disease.  Original Report Authenticated By: Donavan Burnet, M.D.   Ct Head Wo Contrast  10/01/2011  *RADIOLOGY REPORT*  Clinical Data: Altered level of consciousness  CT HEAD WITHOUT CONTRAST  Technique:  Contiguous axial images were obtained from the base  of the skull through the vertex without contrast.  Comparison: 09/23/2011  Findings: Chronic ischemic changes and global atrophy are noted. No mass effect, midline shift, or acute intracranial hemorrhage. Mastoid air cells are clear.  Visualized paranasal sinuses are clear.  IMPRESSION: No acute intracranial pathology.  Chronic changes.  Original Report Authenticated By: Donavan Burnet, M.D.    Medications: Scheduled Meds:   . amLODipine  5 mg Oral Daily  . aspirin EC  81 mg Oral Daily  . metoprolol  100 mg Oral BID  . olmesartan  40 mg Oral Daily  . potassium chloride  40 mEq Oral Once  . potassium chloride  40 mEq Oral Once  . DISCONTD: olmesartan  20 mg Oral Daily  . DISCONTD: potassium  chloride  40 mEq Oral Once  . DISCONTD: potassium chloride  40 mEq Oral Once   Continuous Infusions:   . 0.9 % NaCl with KCl 20 mEq / L 75 mL/hr at 10/01/11 0509  . DISCONTD: sodium chloride     PRN Meds:.acetaminophen, acetaminophen, ALPRAZolam, hydrALAZINE, LORazepam, ondansetron (ZOFRAN) IV, ondansetron, DISCONTD: hydrALAZINE  Assessment:  61 female with hx of dementia, HTN, ca breast s/p mastectomy brought in by family for  hx of frequent falls for past 2 months and worsening confusion for past 2-3 weeks.   PLAN:  Altered mental status  initial w/up for infection including CBC, UA, CXR and head CT unremarkable.  she was noted to have mild hyponatremia and hypokalemia and likely an underlying dehydration which could trigger these symptoms  Patient also noted for elevated CPK and CKMB with negative troponins  she was given IV NS bolus on admission and kcl replenished. Follow up labsshowed improved Na but still continues to have hypokalemia. workup for reversible cause of worsening dementia including TSH, B12 and RPR so far negative.  MRI brain to evaluate potential CVA vs brain mets given hx of invasive breast ca; currently  pending. EEG ordered on admission and negative for epileptic activity Given HTN and hypokalemia a renin -aldosterone level was ordered for concern of hyperaldosteronism and will follow.  -holding zoloft and tramadol  - Cont aricept  -PT eval symptoms possible for rapidly  worsening dementia, chr ischemia change and global atrophy noted on head CT  -would benefit from SNF vs NH if dementia/ delirium persists.  HTN  Cont metoprolol and olmesartan , dose adjusted Renin -aldosterone level ordered for suspected hyperaldosteronism prn hydralazine for elevated BP  Diet: cardiac       LOS: 2 days   Kaylee Turner 10/02/2011, 3:51 PM

## 2011-10-02 NOTE — Progress Notes (Signed)
Physical Therapy Evaluation Patient Details Name: Kaylee Turner MRN: [id number] DOB: 12-Dec-1930 Today's Date: 10/02/2011  Problem List:  Patient Active Problem List  Diagnoses  . Altered level of consciousness  . Hyponatremia  . Hypokalemia  . Hypertension    Past Medical History:  Past Medical History  Diagnosis Date  . Cancer   . Hypertension   . Fibromyalgia    Past Surgical History:  Past Surgical History  Procedure Date  . Breast surgery RIGHT LUMPECTOMY    PT Assessment/Plan/Recommendation PT Assessment Clinical Impression Statement: Patient with general weakness and decreased cognition, impacting functional mobiliity/safety.  At this point, do not feel patient is safe to live alone. PT Recommendation/Assessment: Patient will need skilled PT in the acute care venue PT Problem List: Decreased strength;Decreased activity tolerance;Decreased balance;Decreased mobility;Decreased cognition;Decreased knowledge of use of DME;Decreased safety awareness Barriers to Discharge: Decreased caregiver support Barriers to Discharge Comments: Family reports unable to continue to provide 24 hour assistance. PT Therapy Diagnosis : Difficulty walking;Generalized weakness;Altered mental status PT Plan PT Frequency: Min 3X/week PT Treatment/Interventions: DME instruction;Gait training;Functional mobility training;Therapeutic exercise;Balance training;Cognitive remediation;Patient/family education PT Recommendation Follow Up Recommendations: Skilled nursing facility Equipment Recommended: Defer to next venue PT Goals  Acute Rehab PT Goals PT Goal Formulation: With patient Time For Goal Achievement: 2 weeks Pt will go Supine/Side to Sit: with modified independence PT Goal: Supine/Side to Sit - Progress: Not met Pt will go Sit to Supine/Side: with modified independence PT Goal: Sit to Supine/Side - Progress: Not met Pt will Transfer Sit to Stand/Stand to Sit: with supervision PT  Transfer Goal: Sit to Stand/Stand to Sit - Progress: Not met Pt will Transfer Bed to Chair/Chair to Bed: with supervision PT Transfer Goal: Bed to Chair/Chair to Bed - Progress: Not met Pt will Ambulate: >150 feet;with supervision;with rolling walker PT Goal: Ambulate - Progress: Not met  PT Evaluation Precautions/Restrictions  Precautions Precautions: Fall Precaution Comments: Patient has had multiple falls at home per family.  Each fall was without her walker. Required Braces or Orthoses: No Prior Functioning  Home Living Lives With: Alone Receives Help From: Family;Neighbor (With patient's decline, had become 24 hours. Unable to cont.) Type of Home: Apartment Home Layout: One level Home Access: Level entry Prior Function Level of Independence: Independent with basic ADLs;Requires assistive device for independence;Needs assistance with homemaking Meal Prep: Other (comment) (Family, neighbors brought her meals) Light Housekeeping: Other (comment) (Family assisted with housekeeping) Driving: No Cognition Cognition Arousal/Alertness: Awake/alert Overall Cognitive Status: Impaired Memory: Appears impaired Memory Deficits: Unable to remember layout of apartment.  Increased time to recall family members in room.  Unable to recall recent falls. Orientation Level: Oriented to place;Oriented to person;Disoriented to situation;Disoriented to time Following Commands: Follows one step commands consistently Safety/Judgement: Decreased awareness of safety precautions;Decreased safety judgement for tasks assessed Decreased Safety/Judgement: Decreased awareness of need for assistance Safety/Judgement - Other Comments: Needed repeated reminders to call for assist to get out of bed/chair.  Reminders to always use RW with gait. Awareness of Deficits: Decreased awareness of deficits Awareness of Deficits - Other Comments: Unable to state reason for admission to hospital. Problem Solving: Requires  assistance for problem solving;Other (comment) (Needed assist to maneuver RW around bed.) Sensation/Coordination Sensation Light Touch: Appears Intact Coordination Heel Shin Test: Increased time to complete activity.  Decreased fluidity of movement. Extremity Assessment RLE Assessment RLE Assessment: Exceptions to [facility] RLE AROM (degrees) Overall AROM Right Lower Extremity: Within functional limits for tasks assessed RLE Strength RLE Overall Strength:  Other (Comment) (Grossly 4/5 strength overall) LLE Assessment LLE Assessment: Exceptions to WFL LLE AROM (degrees) Overall AROM Left Lower Extremity: Within functional limits for tasks assessed LLE Strength LLE Overall Strength: Other (Comment) (Grossly 4/5 strength overall) Mobility (including Balance) Bed Mobility Bed Mobility: Yes Rolling Left: 5: Supervision Rolling Left Details (indicate cue type and reason): Cues for technique and to use rail. Left Sidelying to Sit: 4: Min assist Left Sidelying to Sit Details (indicate cue type and reason): Cues for technique.  Assist to raise trunk from bed. Sitting - Scoot to Edge of Bed: 5: Supervision Transfers Transfers: Yes Sit to Stand: 4: Min assist;With upper extremity assist;From bed Sit to Stand Details (indicate cue type and reason): Verbal cues for proper hand placement.  Cues to scoot to edge of bed before standing. Stand to Sit: 4: Min assist;With upper extremity assist;With armrests;To chair/3-in-1 Stand to Sit Details: Verbal cues to reach back for chair before sitting.  Required cues to keep RW with her until backed up to chair (for safety). Ambulation/Gait Ambulation/Gait: Yes Ambulation/Gait Assistance: 4: Min assist Ambulation/Gait Assistance Details (indicate cue type and reason): Verbal and tactile cues to stay close to RW.  Assist to safely use RW, especially during turns.  Cues to slow gait speed to minimize dyspnea.  Patient with 2/4 dyspnea with gait. Ambulation  Distance (Feet): 124 Feet Assistive device: Rolling walker Gait Pattern: Trunk flexed;Decreased stride length Gait velocity: Slow gait speed Stairs: No Wheelchair Mobility Wheelchair Mobility: No  Balance Balance Assessed: No   End of Session PT - End of Session Equipment Utilized During Treatment: Gait belt Activity Tolerance: Patient limited by fatigue;Other (comment) (Dyspnea of 2/4 during session.) Patient left: in chair;with call bell in reach;with family/visitor present Nurse Communication: Mobility status for ambulation General Behavior During Session: [facility] for tasks performed Cognition: Impaired Cognitive Impairment: As above, patient with decreased safety awareness, awareness of deficits, and problem solving.  10/02/2011, 3:28 PM Durenda Hurt. Renaldo Fiddler, [facility] Acute Rehab Services Pager [phone]

## 2011-10-03 ENCOUNTER — Inpatient Hospital Stay (HOSPITAL_COMMUNITY): Payer: Medicare Other

## 2011-10-03 LAB — BASIC METABOLIC PANEL
BUN: 12 mg/dL (ref 6–23)
Calcium: 9.2 mg/dL (ref 8.4–10.5)
Creatinine, Ser: 0.68 mg/dL (ref 0.50–1.10)
GFR calc non Af Amer: 82 mL/min — ABNORMAL LOW (ref >90)
Glucose, Bld: 138 mg/dL — ABNORMAL HIGH (ref 70–99)

## 2011-10-03 MED ORDER — AMLODIPINE BESYLATE 10 MG PO TABS
10.0000 mg | ORAL_TABLET | Freq: Every day | ORAL | Status: DC
  Administered 2011-10-03 – 2011-10-06 (×4): 10 mg via ORAL
  Filled 2011-10-03 (×4): qty 1

## 2011-10-03 MED ORDER — HYDROCHLOROTHIAZIDE 25 MG PO TABS
25.0000 mg | ORAL_TABLET | Freq: Every day | ORAL | Status: DC
  Administered 2011-10-03 – 2011-10-06 (×4): 25 mg via ORAL
  Filled 2011-10-03 (×4): qty 1

## 2011-10-03 MED ORDER — LOSARTAN POTASSIUM 50 MG PO TABS
100.0000 mg | ORAL_TABLET | Freq: Every day | ORAL | Status: DC
  Administered 2011-10-03 – 2011-10-06 (×4): 100 mg via ORAL
  Filled 2011-10-03 (×4): qty 2

## 2011-10-03 MED ORDER — METOPROLOL TARTRATE 50 MG PO TABS
50.0000 mg | ORAL_TABLET | Freq: Two times a day (BID) | ORAL | Status: DC
  Administered 2011-10-03 – 2011-10-06 (×6): 50 mg via ORAL
  Filled 2011-10-03 (×7): qty 1

## 2011-10-03 NOTE — Progress Notes (Signed)
Subjective: Patient seen and examined this morning. Still pleasantly confused . Uncontrolled BP noted.   Objective:  Vital signs in last 24 hours:  Filed Vitals:   10/02/11 2105 10/02/11 2300 10/03/11 0551 10/03/11 0844  BP:  182/96 195/82 192/88  Pulse: 61  71   Temp: 98.7 F (37.1 C)  98.8 F (37.1 C)   TempSrc: Oral  Oral   Resp: 16  14   Height:   5\' 2"  (1.575 m)   Weight:   75 kg (165 lb 5.5 oz)   SpO2: 95%  96%     Intake/Output from previous day:   Intake/Output Summary (Last 24 hours) at 10/03/11 1053 Last data filed at 10/02/11 2239  Gross per 24 hour  Intake      0 ml  Output      2 ml  Net     -2 ml    Physical Exam: General: elderly female lying in bed in no acute distress.  HEENT: no pallor, no icterus, moist oral mucosa, no JVD, no lymphadenopathy  Heart: Normal s1 &s2 Regular rate and rhythm, without murmurs, rubs, gallops.  Lungs: Clear to auscultation bilaterally.  Abdomen: Soft, nontender, nondistended, positive bowel sounds.  Extremities: No clubbing cyanosis or edema with positive pedal pulses.  Neuro: Alert, awake, oriented x1 [to place only], nonfocal.   Lab Results:  Basic Metabolic Panel:    Component Value Date/Time   NA 135 10/03/2011 0500   K 3.9 10/03/2011 0500   CL 100 10/03/2011 0500   CO2 26 10/03/2011 0500   BUN 12 10/03/2011 0500   CREATININE 0.68 10/03/2011 0500   GLUCOSE 138* 10/03/2011 0500   CALCIUM 9.2 10/03/2011 0500   CBC:    Component Value Date/Time   WBC 10.6* 10/01/2011 0645   HGB 12.8 10/01/2011 0645   HGB 12.7 02/02/2011 1108   HCT 36.8 10/01/2011 0645   HCT 36.9 02/02/2011 1108   PLT 272 10/01/2011 0645   PLT 301 02/02/2011 1108   MCV 86.8 10/01/2011 0645   MCV 89.7 02/02/2011 1108   NEUTROABS 8.0* 09/30/2011 2352   LYMPHSABS 2.8 09/30/2011 2352   MONOABS 1.2* 09/30/2011 2352   EOSABS 0.5 09/30/2011 2352   EOSABS 0.4 02/02/2011 1108   BASOSABS 0.0 09/30/2011 2352   BASOSABS 0.0 02/02/2011 1108    No results  found for this or any previous visit (from the past 240 hour(s)).  Studies/Results: Mr Kaylee Turner Contrast  10/03/2011  *RADIOLOGY REPORT*  Clinical Data: Altered mental status.  Increased confusion over last 2 weeks.  MRI HEAD WITHOUT CONTRAST  Technique:  Multiplanar, multiecho pulse sequences of the brain and surrounding structures were obtained according to standard protocol without intravenous contrast.  Comparison: CT head 10/01/2011.  Findings: The diffusion weighted images demonstrate no evidence for acute or subacute infarction.  Incidental note is made of a persistent cavum septum pellucidum et vergae, a normal variant. Moderate atrophy is present.  Extensive periventricular subcortical white matter T2 and FLAIR hyperintensity is evident bilaterally. There is minimal white matter change within the brain stem.  The study is mildly degraded by patient motion.  The ventricles are proportionate to the degree of atrophy.  No significant extra-axial fluid collections are present.  Flow is present in the major intracranial arteries.  The patient is status post left lens extraction.  The globes and orbits are otherwise intact.  The paranasal sinuses and mastoid air cells are clear.  IMPRESSION:  1.  No acute intracranial abnormality. 2.  Generalized atrophy. 3.  Extensive periventricular subcortical white matter change. This likely reflects the sequelae of chronic microvascular ischemia.  Original Report Authenticated By: Kaylee Turner. Kaylee Turner, M.D.    Medications: Scheduled Meds:   . amLODipine  10 mg Oral Daily  . aspirin EC  81 mg Oral Daily  . hydrochlorothiazide  25 mg Oral Daily  . losartan  100 mg Oral Daily  . metoprolol  100 mg Oral BID  . potassium chloride  40 mEq Oral Once  . DISCONTD: amLODipine  5 mg Oral Daily  . DISCONTD: olmesartan  40 mg Oral Daily   Continuous Infusions:   . DISCONTD: 0.9 % NaCl with KCl 20 mEq / L 75 mL/hr at 10/01/11 0509   PRN Meds:.acetaminophen,  acetaminophen, ALPRAZolam, hydrALAZINE, LORazepam, ondansetron (ZOFRAN) IV, ondansetron  Assessment:  68 female with hx of dementia, HTN, ca breast s/p mastectomy brought in by family for hx of frequent falls for past 2 months and worsening confusion for past 2-3 weeks.   PLAN:  Altered mental status  initial w/up for infection including CBC, UA, CXR and head CT unremarkable.  she was noted to have mild hyponatremia and hypokalemia and likely an underlying dehydration which could trigger these symptoms  Patient also noted for elevated CPK and CKMB with negative troponins  she was given IV NS bolus on admission and kcl replenished. Follow up labs this am showed improved Na and hypokalemia also resolved workup for reversible cause of worsening dementia including TSH, B12 and RPR so far negative.  UA unremarkable MRI brain to evaluate potential CVA vs brain mets given hx of invasive breast ca; MRI done today negative for any acute abnormality except chr microascular ischemia which could suggest progressive vascular dementia.. EEG ordered on admission and negative for epileptic activity  Given HTN and hypokalemia a renin -aldosterone level was ordered for concern of hyperaldosteronism and will follow.  -holding zoloft and tramadol  - Cont aricept  -PT eval , recommends Skilled facility for d/c . Not capable to live alone symptoms possible for rapidly worsening dementia, chr ischemia change and global atrophy noted on head CT  -will arrange for placement on monday  Uncontrolled HTN  Cont metoprolol , will reduce dose given HR in high 40s. Pt on losartan/ HCTZ 100/25 mg as per note 6 months back. Also on amlod of 5 mg at home and is unclear about her home meds. She was also not taking her meds at home.  will switch olmesartan to her home dose of Losartan/ hctz. Increased amlod dose to 10 mg daily.  Renin -aldosterone level ordered for suspected hyperaldosteronism and pending. Will also order  fractional metanephrins. Further w/up for HTN following the results prn hydralazine for elevated BP  Cont tele for now  Diet: cardiac          LOS: 3 days   Kaylee Turner 10/03/2011, 10:53 AM

## 2011-10-04 ENCOUNTER — Inpatient Hospital Stay (HOSPITAL_COMMUNITY): Payer: Medicare Other

## 2011-10-04 MED ORDER — DONEPEZIL HCL 5 MG PO TABS
5.0000 mg | ORAL_TABLET | Freq: Every day | ORAL | Status: DC
  Administered 2011-10-04 – 2011-10-05 (×2): 5 mg via ORAL
  Filled 2011-10-04 (×3): qty 1

## 2011-10-04 NOTE — Progress Notes (Signed)
Subjective: Still very confused. MMSE done at bedside this morning with severe cognitive defect. BP slightly improved after meds adjusted.  Objective:  Vital signs in last 24 hours:  Filed Vitals:   10/03/11 1413 10/03/11 2031 10/04/11 0509 10/04/11 0628  BP: 140/73 146/73 186/77 154/82  Pulse: 85 93 83   Temp: 97.9 F (36.6 C) 98.2 F (36.8 C) 97.9 F (36.6 C)   TempSrc: Oral Oral Oral   Resp: 20 18 18    Height:      Weight:      SpO2: 95% 95% 95%     Intake/Output from previous day:   Intake/Output Summary (Last 24 hours) at 10/04/11 1229 Last data filed at 10/04/11 0500  Gross per 24 hour  Intake    242 ml  Output   1051 ml  Net   -809 ml    Physical Exam:  General: elderly female lying in bed in no acute distress.  HEENT: no pallor, no icterus, moist oral mucosa, no JVD, no lymphadenopathy  Heart: Normal s1 &s2 Regular rate and rhythm, without murmurs, rubs, gallops.  Lungs: Clear to auscultation bilaterally.  Abdomen: Soft, nontender, nondistended, positive bowel sounds.  Extremities: No clubbing cyanosis or edema with positive pedal pulses. Tenderness to palpation over rt shoulder with limited ROM. Neuro: Alert, awake, oriented x1 [to place only], nonfocal.   Lab Results:  Basic Metabolic Panel:    Component Value Date/Time   NA 135 10/03/2011 0500   K 3.9 10/03/2011 0500   CL 100 10/03/2011 0500   CO2 26 10/03/2011 0500   BUN 12 10/03/2011 0500   CREATININE 0.68 10/03/2011 0500   GLUCOSE 138* 10/03/2011 0500   CALCIUM 9.2 10/03/2011 0500   CBC:    Component Value Date/Time   WBC 10.6* 10/01/2011 0645   HGB 12.8 10/01/2011 0645   HGB 12.7 02/02/2011 1108   HCT 36.8 10/01/2011 0645   HCT 36.9 02/02/2011 1108   PLT 272 10/01/2011 0645   PLT 301 02/02/2011 1108   MCV 86.8 10/01/2011 0645   MCV 89.7 02/02/2011 1108   NEUTROABS 8.0* 09/30/2011 2352   LYMPHSABS 2.8 09/30/2011 2352   MONOABS 1.2* 09/30/2011 2352   EOSABS 0.5 09/30/2011 2352   EOSABS 0.4  02/02/2011 1108   BASOSABS 0.0 09/30/2011 2352   BASOSABS 0.0 02/02/2011 1108    No results found for this or any previous visit (from the past 240 hour(s)).  Studies/Results: Mr Kaylee Turner Contrast  10/03/2011  *RADIOLOGY REPORT*  Clinical Data: Altered mental status.  Increased confusion over last 2 weeks.  MRI HEAD WITHOUT CONTRAST  Technique:  Multiplanar, multiecho pulse sequences of the brain and surrounding structures were obtained according to standard protocol without intravenous contrast.  Comparison: CT head 10/01/2011.  Findings: The diffusion weighted images demonstrate no evidence for acute or subacute infarction.  Incidental note is made of a persistent cavum septum pellucidum et vergae, a normal variant. Moderate atrophy is present.  Extensive periventricular subcortical white matter T2 and FLAIR hyperintensity is evident bilaterally. There is minimal white matter change within the brain stem.  The study is mildly degraded by patient motion.  The ventricles are proportionate to the degree of atrophy.  No significant extra-axial fluid collections are present.  Flow is present in the major intracranial arteries.  The patient is status post left lens extraction.  The globes and orbits are otherwise intact.  The paranasal sinuses and mastoid air cells are clear.  IMPRESSION:  1.  No acute intracranial  abnormality. 2.  Generalized atrophy. 3.  Extensive periventricular subcortical white matter change. This likely reflects the sequelae of chronic microvascular ischemia.  Original Report Authenticated By: Jamesetta Orleans. MATTERN, M.D.    Medications: Scheduled Meds:   . amLODipine  10 mg Oral Daily  . aspirin EC  81 mg Oral Daily  . hydrochlorothiazide  25 mg Oral Daily  . losartan  100 mg Oral Daily  . metoprolol  50 mg Oral BID  . potassium chloride  40 mEq Oral Once   Continuous Infusions:  PRN Meds:.acetaminophen, acetaminophen, ALPRAZolam, hydrALAZINE, LORazepam, ondansetron (ZOFRAN)  IV, ondansetron  Assessment:  25 female with hx of dementia, HTN, ca breast s/p mastectomy brought in by family for hx of frequent falls for past 2 months and worsening confusion for past 2-3 weeks.   PLAN:   Altered mental status  initial w/up for infection including CBC, UA, CXR and head CT unremarkable.  she was noted to have mild hyponatremia and hypokalemia and likely an underlying dehydration which could trigger these symptoms.  Patient also noted for elevated CPK and CKMB with negative troponins  she was given IV NS bolus on admission and kcl replenished. Follow up labs this am showed improved Na and hypokalemia also resolved  workup for reversible cause of worsening dementia including TSH, B12 and RPR so far negative. UA unremarkable  MRI brain to evaluate potential CVA vs brain mets given hx of invasive breast ca; done on 11/10 negative for any acute abnormality except chr microascular ischemia which could suggest progressive vascular dementia.. EEG ordered on admission and negative for epileptic activity . MMSE done bedside on 11/11 shows significantly impaired cognition with a score of 8 only. Given HTN and hypokalemia a renin -aldosterone level was ordered for concern of hyperaldosteronism and will follow.  -holding zoloft and tramadol  - will start on aricept -PT eval , recommends Skilled facility for d/c . Not capable to live alone  symptoms possible for rapidly worsening dementia, chr ischemia change and global atrophy noted on head CT  -will arrange for placement on monday .  Uncontrolled HTN  Cont metoprolol , will reduced dose given HR in high 40s. Now stable on tele. Pt on losartan/ HCTZ 100/25 mg as per note 6 months back. Also on amlod of 5 mg at home and is unclear about her home meds. She was also not taking her meds at home.  BP slightly improved after  Resuming her home dose of Losartan/ hctz. Increased amlod dose to 10 mg daily.  Renin -aldosterone level ordered for  suspected hyperaldosteronism and pending. Ordered  fractional metanephrins. Further w/up for HTN following the results  prn hydralazine for elevated BP  Cont tele for now    Rt shoulder pain with limited ROM Will check xray rt shoulder  Diet: cardiac     Full code     LOS: 4 days   Kaylee Turner 10/04/2011, 12:29 PM

## 2011-10-05 MED ORDER — LOSARTAN POTASSIUM 100 MG PO TABS: 100.0000 mg | ORAL_TABLET | Freq: Every day | ORAL | Status: DC

## 2011-10-05 MED ORDER — HYDROCHLOROTHIAZIDE 25 MG PO TABS: 25.0000 mg | ORAL_TABLET | Freq: Every day | ORAL | Status: DC

## 2011-10-05 MED ORDER — DONEPEZIL HCL 5 MG PO TABS: 10.0000 mg | ORAL_TABLET | Freq: Every day | ORAL | Status: DC

## 2011-10-05 MED ORDER — AMLODIPINE BESYLATE 10 MG PO TABS: 10.0000 mg | ORAL_TABLET | Freq: Every day | ORAL | Status: DC

## 2011-10-05 MED ORDER — METOPROLOL TARTRATE 50 MG PO TABS: 50.0000 mg | ORAL_TABLET | Freq: Two times a day (BID) | ORAL | Status: DC

## 2011-10-05 NOTE — Progress Notes (Signed)
Clinical Social Worker completed patient psychosocial assessment, please see shadow chart. Pt is open to skilled nursing however very confused. Patient family agreed patient will need assistance and rehab and are open to [facility] skilled nursing facilities. Clinical Social Worker initiated placement, please see placement note in pt shadow chart. Clinical Social Worker informed patient financial estate is in charge of by patient sister Lorrin Mais who can be reached at [phone] and [phone] at work. Pt sister was in process of becoming patient Health Care Power of Attorney before patient became so confused. Pt granddaughters are also assisting with patient care and placement, [name]: [phone] and April [phone].  Clinical Social Worker continuing to follow to assisted with pt dc needs.    Catha Gosselin LCSWA   [phone]   11:34

## 2011-10-05 NOTE — Progress Notes (Signed)
PT Cancellation Note  ___Treatment cancelled today due to medical issues with patient which prohibited therapy  ___ Treatment cancelled today due to patient receiving procedure or test   ___ Treatment cancelled today due to patient's refusal to participate   _x__ Treatment cancelled today due to patient just got her lunch and is eating.  PT to check back this afternoon.     Signature: Rollene Rotunda. Rayette Mogg, PT, DPT [phone]

## 2011-10-05 NOTE — Discharge Summary (Addendum)
Patient ID: Kaylee Turner MRN: [id number] DOB/AGE: Aug 08, 1931 77 y.o.  Admit date: 09/30/2011 Discharge date: 10/06/2011 Primary Care Physician:  Juline Patch, MD, MD  Principle Discharge Diagnoses:   1.Altered mental status secondary to advanced dementia 2.Uncontrolled hypertension 3.Hyponatremia 4.  hypokalemia    Current Discharge Medication List    START taking these medications   Details  amLODipine (NORVASC) 10 MG tablet Take 1 tablet (10 mg total) by mouth daily. Qty: 30 tablet, Refills: 0    hydrochlorothiazide (HYDRODIURIL) 25 MG tablet Take 1 tablet (25 mg total) by mouth daily. Qty: 30 tablet, Refills: 0    losartan (COZAAR) 100 MG tablet Take 1 tablet (100 mg total) by mouth daily. Qty: 30 tablet, Refills: 0      CONTINUE these medications which have CHANGED   Details  donepezil (ARICEPT) 5 MG tablet Take 2 tablets (10 mg total) by mouth at bedtime. Qty: 30 tablet, Refills: 0    metoprolol (LOPRESSOR) 50 MG tablet Take 1 tablet (50 mg total) by mouth 2 (two) times daily. Qty: 60 tablet, Refills: 0      CONTINUE these medications which have NOT CHANGED   Details  ALPRAZolam (XANAX) 0.25 MG tablet Take 0.25 mg by mouth at bedtime as needed.      aspirin EC 81 MG tablet Take 81 mg by mouth daily.      OVER THE COUNTER MEDICATION Citracal (Calcium Citrate + D3       STOP taking these medications     cefaclor (CECLOR) 250 MG capsule      olmesartan (BENICAR) 20 MG tablet      sertraline (ZOLOFT) 50 MG tablet      traMADol-acetaminophen (ULTRACET) 37.5-325 MG per tablet         Disposition and Follow-up:  To SNF  Consults:  none  Significant Diagnostic Studies:  Dg Chest 2 View  10/01/2011  *RADIOLOGY REPORT*  Clinical Data: Altered level of consciousness  CHEST - 2 VIEW  Comparison: 12/25/2010  Findings: Mild cardiomegaly.  Normal pulmonary vascularity.  Clear lungs.  No pneumothorax.  No pleural effusion.  IMPRESSION: No active  cardiopulmonary disease.  Original Report Authenticated By: Donavan Burnet, M.D.   Ct Head Wo Contrast  10/01/2011  *RADIOLOGY REPORT*  Clinical Data: Altered level of consciousness  CT HEAD WITHOUT CONTRAST  Technique:  Contiguous axial images were obtained from the base of the skull through the vertex without contrast.  Comparison: 09/23/2011  Findings: Chronic ischemic changes and global atrophy are noted. No mass effect, midline shift, or acute intracranial hemorrhage. Mastoid air cells are clear.  Visualized paranasal sinuses are clear.  IMPRESSION: No acute intracranial pathology.  Chronic changes.  Original Report Authenticated By: Donavan Burnet, M.D.    Brief H and P: For complete details please refer to admission H and P, but in brief 77 year old female with H/O dementia, hypertension, CA Breast s/p mastectomy was brought into ER by family as patient was founf increasingly confused over last two weeks. Patient had CT head which did not show anything acute. Patient is afebrile and CXR and UA does not show anything acute. The only new medication started recently as per family was benicar.  Patient is non focal. Does not complain any pain or shortness of breath.  Was found to be hyponatremic and hpokalemic.   Physical Exam on Discharge:  Filed Vitals:   10/04/11 2040 10/05/11 0443 10/05/11 0500 10/05/11 0958  BP: 127/70 162/70 152/68 128/60  Pulse: 83  80  92  Temp: 97.9 F (36.6 C) 98.8 F (37.1 C)    TempSrc: Oral Oral    Resp: 18 18    Height:      Weight:      SpO2: 96% 96%       Intake/Output Summary (Last 24 hours) at 10/05/11 1121 Last data filed at 10/05/11 0901  Gross per 24 hour  Intake    360 ml  Output    950 ml  Net   -590 ml   General: elderly female lying in bed in no acute distress.  HEENT: no pallor, no icterus, moist oral mucosa, no JVD, no lymphadenopathy  Heart: Normal s1 &s2 Regular rate and rhythm, without murmurs, rubs, gallops.  Lungs: Clear to  auscultation bilaterally.  Abdomen: Soft, nontender, nondistended, positive bowel sounds.  Extremities: No clubbing cyanosis or edema with positive pedal pulses. Tenderness to palpation over rt shoulder with limited ROM.  Neuro: Alert, awake, oriented x1 [to place only], nonfocal.   CBC:    Component Value Date/Time   WBC 10.6* 10/01/2011 0645   HGB 12.8 10/01/2011 0645   HGB 12.7 02/02/2011 1108   HCT 36.8 10/01/2011 0645   HCT 36.9 02/02/2011 1108   PLT 272 10/01/2011 0645   PLT 301 02/02/2011 1108   MCV 86.8 10/01/2011 0645   MCV 89.7 02/02/2011 1108   NEUTROABS 8.0* 09/30/2011 2352   LYMPHSABS 2.8 09/30/2011 2352   MONOABS 1.2* 09/30/2011 2352   EOSABS 0.5 09/30/2011 2352   EOSABS 0.4 02/02/2011 1108   BASOSABS 0.0 09/30/2011 2352   BASOSABS 0.0 02/02/2011 1108    Basic Metabolic Panel:    Component Value Date/Time   NA 135 10/03/2011 0500   K 3.9 10/03/2011 0500   CL 100 10/03/2011 0500   CO2 26 10/03/2011 0500   BUN 12 10/03/2011 0500   CREATININE 0.68 10/03/2011 0500   GLUCOSE 138* 10/03/2011 0500   CALCIUM 9.2 10/03/2011 0500   Renin aldosterone ratio: results pending Fractional metanephrines: pending  MMSE done at bedside on 11/11: Score of 8 suggestive og  severe cognitive deficiency    Hospital Course:   Altered mental status  initial w/up for infection including CBC, UA, CXR and head CT unremarkable.  she was noted to have mild hyponatremia and hypokalemia and likely an underlying dehydration which could have  trigger these symptoms.  Patient also noted for elevated CPK and CKMB with negative troponins.  she was given IV NS bolus on admission and kcl replenished. Follow up labs  showed improved Na and hypokalemia also resolved . workup for reversible cause of worsening dementia including TSH, B12 and RPR  negative. UA unremarkable.  MRI brain to evaluate potential CVA vs brain mets given hx of invasive breast ca; done on 11/10 negative for any acute abnormality  except chr microascular ischemia which could suggest progressive vascular dementia.. As per family patient was normal 2 months back, independent and even driving by herself. EEG ordered on admission and negative for epileptic activity .  MMSE done bedside on 11/11 shows significantly impaired cognition with a score of 8 only.  Given HTN and hypokalemia a renin -aldosterone level was ordered for concern of hyperaldosteronism and needs to be followed as outpt. -holding zoloft and tramadol  - will start on aricept  -PT eval , recommends Skilled facility for d/c . Not capable to live alone  symptoms possible for rapidly worsening dementia, chr ischemia change and global atrophy noted on head CT  Uncontrolled HTN   reduced metoprolol dose given HR in high 40s. Now stable on tele. Pt on losartan/ HCTZ 100/25 mg as per note 6 months back. Also on amlod of 5 mg at home and is unclear about her home meds. She was also not taking her meds at home.  BP slightly improved after Resuming her home dose of Losartan/ hctz. Increased amlod dose to 10 mg daily.  Renin -aldosterone level ordered for suspected hyperaldosteronism and pending. Ordered fractional metanephrins. Further w/up for HTN can be done following the results  prn hydralazine given  for elevated BP  Patient stable on tele  Discussed with family and agree with placement. Discussed with PCP Dr Ricki Miller who agrees with the plan.     Time spent on Discharge: 45 minutes  Signed: Eddie North 10/05/2011, 11:21 AM  Patient discharged on 11/13

## 2011-10-05 NOTE — Progress Notes (Signed)
Physical Therapy Treatment Patient Details Name: Kaylee Turner MRN: [id number] DOB: 1930-12-22 Today's Date: 10/05/2011  PT Assessment/Plan  PT - Assessment/Plan Comments on Treatment Session: Patient is progressing well with improved tolerance of gait.  VSS, and no DOE during today's session.  Her cognition is impairing her safety with gait and she needs constant hands-on assist to be safe.   PT Plan: Discharge plan remains appropriate Follow Up Recommendations: Skilled nursing facility Equipment Recommended: Defer to next venue PT Goals  Acute Rehab PT Goals PT Goal: Supine/Side to Sit - Progress: Progressing toward goal PT Goal: Sit to Supine/Side - Progress: Other (comment) (not tested today) PT Transfer Goal: Sit to Stand/Stand to Sit - Progress: Progressing toward goal PT Transfer Goal: Bed to Chair/Chair to Bed - Progress: Other (comment) (not tested today) PT Goal: Ambulate - Progress: Progressing toward goal  PT Treatment Precautions/Restrictions  Precautions Precautions: Fall Precaution Comments: chair alarm engaged at end of session Required Braces or Orthoses: No Restrictions Weight Bearing Restrictions: No Mobility (including Balance) Bed Mobility Bed Mobility: Yes Supine to Sit: 4: Min assist;With rails;HOB flat Supine to Sit Details (indicate cue type and reason): min assist to get patient's trunk to upright sitting.  Patietn using one hand on rail and pulling with one hand on rail and one hand pulling on PT.   Sitting - Scoot to Edge of Bed: 6: Modified independent (Device/Increase time) Transfers Sit to Stand: 4: Min assist;With upper extremity assist;From bed Sit to Stand Details (indicate cue type and reason): min assist to get patient up over weak legs.   Stand to Sit: 4: Min assist;To chair/3-in-1 Stand to Sit Details: min assist to help control descent to sit and make sure patient hits her target of the chair (patient tends to sit prematurely without  looking or feeling for chair). Ambulation/Gait Ambulation/Gait Assistance: 4: Min assist Ambulation/Gait Assistance Details (indicate cue type and reason): min assist to steady trunk, help steer RW Ambulation Distance (Feet): 180 Feet Assistive device: Rolling walker Gait Pattern: Shuffle    Exercise  General Exercises - Lower Extremity Ankle Circles/Pumps: AROM;Both;10 reps;Seated Heel Slides: AROM;10 reps;Seated Straight Leg Raises: AROM;Both;10 reps;Seated End of Session PT - End of Session Equipment Utilized During Treatment:  (RW) Activity Tolerance: Patient tolerated treatment well (no DOE noted with gait today) Patient left: in chair;with call bell in reach;with bed alarm set;Other (comment) (chair alarm) Nurse Communication: Other (comment) (Aked RN tech to keep an eye on her, help her eat dinner ) General Behavior During Session: Restless Cognition: Impaired Cognitive Impairment: decreased sustaine attention less than one minute.  Needs constant redirecetion and is very easily distracted in busy hallway.  Rollene Rotunda Zared Knoth, PT, DPT [phone]  10/05/2011, 3:55 PM

## 2011-10-06 NOTE — Progress Notes (Signed)
Pt family given discharge packet for admission to nursing home after discharge.

## 2011-10-06 NOTE — Progress Notes (Signed)
Clinical social work assisted with patient discharge to skilled nursing facility for short term rehab. Patient transportation provided by patient granddaughters with chart copy. No further clinical social work needs. Signing off.   Catha Gosselin, [place]   [phone]  .10/06/2011 15:00

## 2011-10-06 NOTE — Progress Notes (Signed)
Subjective: Patient seen and examined this morning. No overnight issues. BP well controlled  Objective:  Vital signs in last 24 hours:  Filed Vitals:   10/05/11 1300 10/05/11 1546 10/05/11 2021 10/06/11 0622  BP: 125/75  108/69 135/78  Pulse: 85 76 69 67  Temp: 97.5 F (36.4 C)  98.3 F (36.8 C) 98.2 F (36.8 C)  TempSrc: Oral  Oral Oral  Resp: 17  18 18   Height:      Weight:      SpO2: 95% 96% 96% 95%    Intake/Output from previous day:   Intake/Output Summary (Last 24 hours) at 10/06/11 0846 Last data filed at 10/05/11 1900  Gross per 24 hour  Intake    600 ml  Output    400 ml  Net    200 ml    Physical Exam:  General: elderly female lying in bed in no acute distress.  HEENT: no pallor, no icterus, moist oral mucosa, no JVD, no lymphadenopathy  Heart: Normal s1 &s2 Regular rate and rhythm, without murmurs, rubs, gallops.  Lungs: Clear to auscultation bilaterally.  Abdomen: Soft, nontender, nondistended, positive bowel sounds.  Extremities: No clubbing cyanosis or edema with positive pedal pulses. Tenderness to palpation over rt shoulder with limited ROM.  Neuro: Alert, awake, oriented x1 [to place only], nonfocal.    Lab Results:  Basic Metabolic Panel:    Component Value Date/Time   NA 135 10/03/2011 0500   K 3.9 10/03/2011 0500   CL 100 10/03/2011 0500   CO2 26 10/03/2011 0500   BUN 12 10/03/2011 0500   CREATININE 0.68 10/03/2011 0500   GLUCOSE 138* 10/03/2011 0500   CALCIUM 9.2 10/03/2011 0500   CBC:    Component Value Date/Time   WBC 10.6* 10/01/2011 0645   HGB 12.8 10/01/2011 0645   HGB 12.7 02/02/2011 1108   HCT 36.8 10/01/2011 0645   HCT 36.9 02/02/2011 1108   PLT 272 10/01/2011 0645   PLT 301 02/02/2011 1108   MCV 86.8 10/01/2011 0645   MCV 89.7 02/02/2011 1108   NEUTROABS 8.0* 09/30/2011 2352   LYMPHSABS 2.8 09/30/2011 2352   MONOABS 1.2* 09/30/2011 2352   EOSABS 0.5 09/30/2011 2352   EOSABS 0.4 02/02/2011 1108   BASOSABS 0.0 09/30/2011 2352   BASOSABS 0.0 02/02/2011 1108    No results found for this or any previous visit (from the past 240 hour(s)).  Studies/Results: Dg Shoulder Right  10/04/2011  *RADIOLOGY REPORT*  Clinical Data: Right shoulder pain and limited range of motion. Previous right shoulder prosthesis insertion 1 year ago.  RIGHT SHOULDER - 2+ VIEW  Comparison: None.  Findings: There is no evidence of loosening of the proximal humeral prosthesis.  The prosthesis is superiorly subluxed with erosion of the glenoid. The prosthesis articulates with the undersurface of the acromion.  No fracture or dislocation.  IMPRESSION: Erosions of the glenoid and undersurface of the acromion.  Superior subluxation of the prosthesis.  Original Report Authenticated By: Gwynn Burly, M.D.    Medications: Scheduled Meds:   . amLODipine  10 mg Oral Daily  . aspirin EC  81 mg Oral Daily  . donepezil  5 mg Oral QHS  . hydrochlorothiazide  25 mg Oral Daily  . losartan  100 mg Oral Daily  . metoprolol  50 mg Oral BID  . potassium chloride  40 mEq Oral Once   Continuous Infusions:  PRN Meds:.acetaminophen, acetaminophen, ALPRAZolam, hydrALAZINE, LORazepam, ondansetron (ZOFRAN) IV, ondansetron  Assessment:  86 female with  hx of dementia, HTN, ca breast s/p mastectomy brought in by family for hx of frequent falls for past 2 months and worsening confusion for past 2-3 weeks.  PLAN:  Altered mental status  initial w/up for infection including CBC, UA, CXR and head CT unremarkable.  she was noted to have mild hyponatremia and hypokalemia and likely an underlying dehydration which could trigger these symptoms.  Patient also noted for elevated CPK and CKMB with negative troponins  she was given IV NS bolus on admission and kcl replenished. Follow up labs this am showed improved Na and hypokalemia also resolved  workup for reversible cause of worsening dementia including TSH, B12 and RPR so far negative. UA unremarkable  MRI brain to  evaluate potential CVA vs brain mets given hx of invasive breast ca; done on 11/10 negative for any acute abnormality except chr microascular ischemia which could suggest progressive vascular dementia.. EEG ordered on admission and negative for epileptic activity .  MMSE done bedside on 11/11 shows significantly impaired cognition with a score of 8 only.  Given HTN and hypokalemia a renin -aldosterone level was ordered for concern of hyperaldosteronism and will follow.  -holding zoloft and tramadol  - will start on aricept  -PT eval , recommends Skilled facility for d/c . Not capable to live alone  symptoms possible for rapidly worsening dementia, chr ischemia change and global atrophy noted on head CT    Uncontrolled HTN  Cont metoprolol , will reduced dose given HR in high 40s. Now stable on tele. Pt on losartan/ HCTZ 100/25 mg as per note 6 months back. Also on amlod of 5 mg at home and is unclear about her home meds. She was also not taking her meds at home.  BP improved after Resuming her home dose of Losartan/ hctz. Increased amlod dose to 10 mg daily.  Renin -aldosterone level ordered for suspected hyperaldosteronism and pending. Ordered fractional metanephrins. Further w/up for HTN following the results  prn hydralazine for elevated BP    Rt shoulder pain with limited ROM  Rt shoulder x ray without fracture or dislocation Tylenol prn for pain  Diet: cardiac     Patient is clinically stable to be discharged to SNF if bed available    LOS: 6 days   Carrieanne Kleen 10/06/2011, 8:46 AM

## 2011-10-06 NOTE — Plan of Care (Signed)
Problem: Phase III Progression Outcomes Goal: Discharge plan remains appropriate-arrangements made Outcome: Adequate for Discharge Pt going to SNF

## 2011-10-21 LAB — ALDOSTERONE + RENIN ACTIVITY W/ RATIO: Aldosterone: 3

## 2012-06-13 ENCOUNTER — Other Ambulatory Visit: Payer: Self-pay | Admitting: Internal Medicine

## 2012-06-13 DIAGNOSIS — Z853 Personal history of malignant neoplasm of breast: Secondary | ICD-10-CM

## 2012-06-13 DIAGNOSIS — N644 Mastodynia: Secondary | ICD-10-CM

## 2012-06-15 ENCOUNTER — Ambulatory Visit
Admission: RE | Admit: 2012-06-15 | Discharge: 2012-06-15 | Disposition: A | Discharge status: Home / Self Care | Payer: Medicare Other | Source: Ambulatory Visit | Attending: Internal Medicine | Admitting: Internal Medicine

## 2012-06-15 DIAGNOSIS — N644 Mastodynia: Secondary | ICD-10-CM

## 2012-06-15 DIAGNOSIS — Z853 Personal history of malignant neoplasm of breast: Secondary | ICD-10-CM

## 2013-03-07 DIAGNOSIS — I739 Peripheral vascular disease, unspecified: Secondary | ICD-10-CM

## 2013-04-07 DIAGNOSIS — I509 Heart failure, unspecified: Secondary | ICD-10-CM

## 2013-04-07 DIAGNOSIS — I110 Hypertensive heart disease with heart failure: Secondary | ICD-10-CM

## 2013-04-30 DIAGNOSIS — F0390 Unspecified dementia without behavioral disturbance: Secondary | ICD-10-CM

## 2013-06-10 ENCOUNTER — Non-Acute Institutional Stay (SKILLED_NURSING_FACILITY): Payer: PRIVATE HEALTH INSURANCE | Admitting: Internal Medicine

## 2013-06-10 DIAGNOSIS — F0280 Dementia in other diseases classified elsewhere without behavioral disturbance: Secondary | ICD-10-CM

## 2013-06-10 DIAGNOSIS — I509 Heart failure, unspecified: Secondary | ICD-10-CM

## 2013-06-10 DIAGNOSIS — G309 Alzheimer's disease, unspecified: Secondary | ICD-10-CM

## 2013-06-10 DIAGNOSIS — I10 Essential (primary) hypertension: Secondary | ICD-10-CM

## 2013-06-10 NOTE — Progress Notes (Signed)
Patient ID: Kaylee Turner, female   DOB: 10/27/1931, 79 y.o.   MRN: [id number] Facility; [facility]. Chief complaint; review of general medical issues, routine monthly [facility] visit for June. History; this is a patient who was transferred here from [place] nursing home. Patient has dementia, although she scores fairly well on the Folstein Mini-Mental Status she remains on Aricept and Namenda. She is able to ambulate in her room, uses a wheelchair to get around the facility. No major new issues are identified  Past medical history/problem list. #1 dementia, mild. #2 congestive heart failure, previous echo revealed mild left ventricular hypertrophy, EF of 50-55%. 3 hypertension. #4 probable peripheral vascular disease. #5 generalized osteoarthritis. #6 osteoporosis.  Past Surgical History  Procedure Laterality Date  . Breast surgery  RIGHT LUMPECTOMY   Medications list is reviewed. She is on acetaminophen 502 tablets twice daily, clonidine 0.3, twice a day, vitamin D 3 50,000 units monthly, Aricept 10 daily, ASA 81 daily, Cozaar, 100 mg daily, Lasix, 40 mg by mouth daily, potassium chloride 30 mEq by mouth daily, Namenda, XOR 28 mg daily, Lopressor, 3 tablets 75 mg by mouth twice daily.  Physical examination pulse rate 56, respirations 20, O2 sat 96% on room air. Blood pressure 160/80. Respiratory clear entry bilaterally. Cardiac heart sounds are normal. No S3 no S4. No signs of congestive heart failure.  Impression/plan #1 congestive heart failure. This appears to be well compensated. #2 hypertension. Blood pressure appears to be mostly well controlled. #3 dementia on an aggressive regimen of Aricept and Namenda

## 2013-07-19 ENCOUNTER — Non-Acute Institutional Stay (SKILLED_NURSING_FACILITY): Payer: PRIVATE HEALTH INSURANCE | Admitting: Internal Medicine

## 2013-07-19 DIAGNOSIS — F0280 Dementia in other diseases classified elsewhere without behavioral disturbance: Secondary | ICD-10-CM

## 2013-07-19 DIAGNOSIS — N189 Chronic kidney disease, unspecified: Secondary | ICD-10-CM

## 2013-07-19 NOTE — Progress Notes (Signed)
Patient ID: Kaylee Turner, female   DOB: Sep 01, 1931, 79 y.o.   MRN: [id number]              Facility; [facility]. Chief complaint; review of general medical issues, routine monthly [facility] visit for July History; this is a patient who was transferred here from [place] nursing home. Patient has dementia, although she scores fairly well on the Folstein Mini-Mental Status she remains on Aricept and Namenda. She is able to ambulate in her room, uses a wheelchair to get around the facility. No major new issues are identified  Past medical history/problem list. #1 dementia, mild. #2 congestive heart failure, previous echo revealed mild left ventricular hypertrophy, EF of 50-55%. 3 hypertension. #4 probable peripheral vascular disease. #5 generalized osteoarthritis. #6 osteoporosis.    Past Surgical History   Procedure  Laterality  Date   .  Breast surgery    RIGHT LUMPECTOMY    Medications list is reviewed. She is on acetaminophen 500 2 tablets twice daily, clonidine 0.3, twice a day, vitamin D 3 50,000 units monthly, Aricept 10 daily, ASA 81 daily, Cozaar, 100 mg daily, Lasix, 40 mg by mouth daily, potassium chloride 30 mEq by mouth daily, Namenda, XOR 28 mg daily, Lopressor, 3 tablets 75 mg by mouth twice daily.  Physical examination pulse rate 52, respirations 20, O2 sat 96% on room air. Blood pressure 164/80. Respiratory clear entry bilaterally. Cardiac heart sounds are normal. No S3 no S4. No signs of congestive heart failure.  Impression/plan #1 congestive heart failure. This appears to be well compensated. #2 hypertension. Blood pressure appears to be mostly well controlled. #3 dementia on an aggressive regimen of Aricept and Namenda #4 CRF stage iii. Bun 21 Cr 1.13 06/20/2013

## 2013-08-05 ENCOUNTER — Non-Acute Institutional Stay (SKILLED_NURSING_FACILITY): Payer: PRIVATE HEALTH INSURANCE | Admitting: Internal Medicine

## 2013-08-05 DIAGNOSIS — F0280 Dementia in other diseases classified elsewhere without behavioral disturbance: Secondary | ICD-10-CM

## 2013-08-05 DIAGNOSIS — N189 Chronic kidney disease, unspecified: Secondary | ICD-10-CM

## 2013-08-05 NOTE — Progress Notes (Signed)
Patient ID: Kaylee Turner, female   DOB: 17-Nov-1931, 79 y.o.   MRN: [id number]              Facility; [facility]. Chief complaint; review of general medical issues, routine monthly [facility] visit for August History; this is a patient who was transferred here from [place] nursing home. Patient has dementia, although she scores fairly well on the Folstein Mini-Mental Status she remains on Aricept and Namenda. She is able to ambulate in her room, uses a wheelchair to get around the facility. No major new issues are identified  Past medical history/problem list. #1 dementia, mild. #2 congestive heart failure, previous echo revealed mild left ventricular hypertrophy, EF of 50-55%. 3 hypertension. #4 probable peripheral vascular disease. #5 generalized osteoarthritis. #6 osteoporosis. #7; mild chronic renal insufficiency. BUN 21 creatinine 1.13 estimated GFR of 53 on July 29    Past Surgical History   Procedure  Laterality  Date   .  Breast surgery    RIGHT LUMPECTOMY    Medications list is reviewed. She is on acetaminophen 500 2 tablets twice daily, clonidine 0.3, twice a day, vitamin D 3 50,000 units monthly, Aricept 10 daily, ASA 81 daily, Cozaar, 100 mg daily, Lasix, 40 mg by mouth daily, potassium chloride 30 mEq by mouth daily, Namenda, XOR 28 mg daily, Lopressor, 3 tablets 75 mg by mouth twice daily.  Physical examination pulse rate 62, respirations 20, O2 sat 96% on room air. Blood pressure 158/80 Respiratory clear entry bilaterally. Cardiac heart sounds are normal. No S3 no S4. No signs of congestive heart failure.  Impression/plan #1 congestive heart failure. This appears to be well compensated. #2 hypertension. Blood pressure control is borderline #3 dementia on an aggressive regimen of Aricept and Namenda #4 CRF stage iii. Bun 21 Cr 1.13 06/20/2013

## 2013-09-15 ENCOUNTER — Non-Acute Institutional Stay (SKILLED_NURSING_FACILITY): Payer: PRIVATE HEALTH INSURANCE | Admitting: Internal Medicine

## 2013-09-15 DIAGNOSIS — N189 Chronic kidney disease, unspecified: Secondary | ICD-10-CM

## 2013-09-15 DIAGNOSIS — F0280 Dementia in other diseases classified elsewhere without behavioral disturbance: Secondary | ICD-10-CM

## 2013-09-15 DIAGNOSIS — I509 Heart failure, unspecified: Secondary | ICD-10-CM

## 2013-09-15 NOTE — Progress Notes (Signed)
Patient ID: Kaylee Turner, female   DOB: [birth year], 79 y.o.   MRN: [id number]               Facility; [facility]. Chief complaint; review of general medical issues, routine monthly Evercare visit for September History; this is a patient who was transferred here from [place] nursing home. Patient has dementia, although she scores fairly well on the Folstein Mini-Mental Status she remains on Aricept and Namenda. She is able to ambulate in her room, uses a wheelchair to get around the facility. No major new issues are identified  Past medical history/problem list. #1 dementia, mild. #2 congestive heart failure, previous echo revealed mild left ventricular hypertrophy, EF of 50-55%. 3 hypertension. This is marginally controlled #4 probable peripheral vascular disease. #5 generalized osteoarthritis. #6 osteoporosis. #7; mild chronic renal insufficiency. BUN 21 creatinine 1.13 estimated GFR of 53 on July 29    Past Surgical History   Procedure  Laterality  Date   .  Breast surgery    RIGHT LUMPECTOMY    Medications list is reviewed. She is on acetaminophen 500 2 tablets twice daily, clonidine 0.3, twice a day, vitamin D 3 50,000 units monthly, Aricept 10 daily, ASA 81 daily, Cozaar, 100 mg daily, Lasix, 40 mg by mouth daily, potassium chloride 30 mEq by mouth daily, Namenda, XOR 28 mg daily, Lopressor, 3 tablets 75 mg by mouth twice daily.  Physical examination pulse rate 62, respirations 20, O2 sat 96% on room air. Blood pressure 158/80 Respiratory clear entry bilaterally. Cardiac heart sounds are normal. No S3 no S4. No signs of congestive heart failure.  Impression/plan #1 congestive heart failure. This appears to be well compensated. #2 hypertension. Blood pressure control is borderline. She is on a aggressive regimen including clonidine 0.3 twice a day, Lasix 80 daily, Cardizem CD 240 daily, Cozaar 100 daily 10 Lopressor 75 twice a day. I note her renal ultrasound was  suggested as a screen I suppose for renovascular disease although I don't see this result. #3 dementia on an aggressive regimen of Aricept and Namenda #4 CRF stage iii. Bun 21 Cr 1.13 06/20/2013

## 2013-10-14 ENCOUNTER — Non-Acute Institutional Stay (SKILLED_NURSING_FACILITY): Payer: PRIVATE HEALTH INSURANCE | Admitting: Internal Medicine

## 2013-10-14 DIAGNOSIS — I509 Heart failure, unspecified: Secondary | ICD-10-CM

## 2013-10-14 DIAGNOSIS — N189 Chronic kidney disease, unspecified: Secondary | ICD-10-CM

## 2013-10-14 NOTE — Progress Notes (Signed)
Patient ID: Kaylee Turner, female   DOB: February 17, 1931, 79 y.o.   MRN: [id number]      Facility; [facility]. Chief complaint; review of general medical issues, routine monthly [facility] visit for October History; this is a patient who was transferred here from [place] nursing home. Patient has dementia, although she scores fairly well on the Folstein Mini-Mental Status she remains on Aricept and Namenda. She is able to ambulate in her room, uses a wheelchair to get around the facility. No major new issues are identified  Past medical history/problem list. #1 dementia, mild. #2 congestive heart failure, previous echo revealed mild left ventricular hypertrophy, EF of 50-55%. 3 hypertension. This is marginally controlled #4 probable peripheral vascular disease. #5 generalized osteoarthritis. #6 osteoporosis. #7; mild chronic renal insufficiency.   Labs have been updated this month showing an essentially normal CBC. BUN 24 creatinine 1.16 sodium 142 potassium 4.2. BNP was 111.4    Past Surgical History   Procedure  Laterality  Date   .  Breast surgery    RIGHT LUMPECTOMY    Medications list is reviewed. She is on acetaminophen 500 2 tablets twice daily, clonidine 0.3, twice a day, vitamin D 3 50,000 units monthly, Aricept 10 daily, ASA 81 daily, Cozaar, 100 mg daily, Lasix, 40 mg by mouth daily, potassium chloride 30 mEq by mouth daily, Namenda, XOR 28 mg daily, Lopressor, 3 tablets 75 mg by mouth twice daily.  Physical examination pulse rate 74, respirations 20, O2 sat 96% on room air. Blood pressure 140/68weight is 194 pounds which is up 9 pounds from last month Respiratory clear entry bilaterally. Cardiac heart sounds are normal. No S3 no S4. No signs of congestive heart failure.  Impression/plan #1 congestive heart failure. This appears to be well compensated. Does not seem to be any evidence of congestive heart failure in spite of the weight gain #2 hypertension. Blood pressure  control is borderline. She is on a aggressive regimen including clonidine 0.3 twice a day, Lasix 80 daily, Cardizem CD 240 daily, Cozaar 100 daily 10 Lopressor 75 twice a day. I note her renal ultrasound was suggested as a screen I suppose for renovascular disease although I don't see this result. #3 dementia on an aggressive regimen of Aricept and Namenda #4 CRF stage iii. Bun 24 Cr 1.16

## 2013-11-06 ENCOUNTER — Non-Acute Institutional Stay (SKILLED_NURSING_FACILITY): Payer: PRIVATE HEALTH INSURANCE | Admitting: Internal Medicine

## 2013-11-06 DIAGNOSIS — N189 Chronic kidney disease, unspecified: Secondary | ICD-10-CM

## 2013-11-06 DIAGNOSIS — I509 Heart failure, unspecified: Secondary | ICD-10-CM

## 2013-11-06 NOTE — Progress Notes (Signed)
Patient ID: Kaylee Turner, female   DOB: 1931/05/22, 79 y.o.   MRN: [id number]      Facility; [facility]. Chief complaint; review of general medical issues, routine monthly [facility] visit for November. History; this is a patient who was transferred here from [place] nursing home. Patient has dementia, although she scores fairly well on the Folstein Mini-Mental Status she remains on Aricept and Namenda. She is able to ambulate in her room, uses a wheelchair to get around the facility. No major new issues are identified  She was noted to have a presented by her by 3 cm dark flaky lesion at 7:00 on the right breast. She has a history of left breast cancer this was in February 2012. She had a right lumpectomy. It was elected to follow this area conservatively. Area in question is felt to be a seborrheic keratosis   Past medical history/problem list. #1 dementia, mild. #2 congestive heart failure, previous echo revealed mild left ventricular hypertrophy, EF of 50-55%. 3 hypertension. This is marginally controlled #4 probable peripheral vascular disease. #5 generalized osteoarthritis. #6 osteoporosis. #7; mild chronic renal insufficiency.   Labs have been updated this month showing an essentially normal CBC. BUN 24 creatinine 1.16 sodium 142 potassium 4.2. BNP was 111.4    Past Surgical History   Procedure  Laterality  Date   .  Breast surgery    RIGHT LUMPECTOMY    Medications list is reviewed. She is on acetaminophen 500 2 tablets twice daily, clonidine 0.3, twice a day, vitamin D 3 50,000 units monthly, Aricept 10 daily, ASA 81 daily, Cozaar, 100 mg daily, Lasix, 40 mg by mouth daily, potassium chloride 30 mEq by mouth daily, Namenda, XOR 28 mg daily, Lopressor, 3 tablets 75 mg by mouth twice daily.  Physical examination pulse rate 74, respirations 20, O2 sat 96% on room air. Blood pressure 140/68weight is 194 pounds which is up 9 pounds from last month Respiratory clear entry  bilaterally. Cardiac heart sounds are normal. No S3 no S4. No signs of congestive heart failure. Right breast; on the inferior aspect is a quarter size seborrheic keratosis. This is a skin lesion, not a intraparenchymal lesion and I think simply needs to be followed  Impression/plan #1 congestive heart failure. This appears to be well compensated. Does not seem to be any evidence of congestive heart failure in spite of the weight gain #2 hypertension. Blood pressure control is borderline. She is on a aggressive regimen including clonidine 0.3 twice a day, Lasix 80 daily, Cardizem CD 240 daily, Cozaar 100 daily 10 Lopressor 75 twice a day. I note her renal ultrasound was suggested as a screen I suppose for renovascular disease although I don't see this result. #3 dementia on an aggressive regimen of Aricept and Namenda #4 CRF stage iii. Bun 24 Cr 1.16

## 2014-01-09 ENCOUNTER — Encounter: Payer: Self-pay | Admitting: *Deleted

## 2014-02-13 ENCOUNTER — Non-Acute Institutional Stay (SKILLED_NURSING_FACILITY): Payer: PRIVATE HEALTH INSURANCE | Admitting: Internal Medicine

## 2014-02-13 DIAGNOSIS — N189 Chronic kidney disease, unspecified: Secondary | ICD-10-CM

## 2014-02-13 DIAGNOSIS — I10 Essential (primary) hypertension: Secondary | ICD-10-CM

## 2014-02-13 DIAGNOSIS — I509 Heart failure, unspecified: Secondary | ICD-10-CM

## 2014-02-13 NOTE — Progress Notes (Signed)
Patient ID: Kaylee Turner, female   DOB: 02-15-1931, 80 y.o.   MRN: [id number]      Facility; [facility]. Chief complaint; review of general medical issues, routine monthly [facility] visit for February  History; this is a patient who was transferred here from [place] home. Patient has dementia, although she scores fairly well on the Folstein Mini-Mental Status she remains on Aricept and Namenda. She is able to ambulate in her room, uses a wheelchair to get around the facility. No major new issues are identified  She was noted to have a  3 cm dark flaky lesion at 7:00 on the right breast. She has a history of left breast cancer this was in February 2012. She had a right lumpectomy. It was elected to follow this area conservatively. Area in question is felt to be a seborrheic keratosis. After her right lumpectomy in 2012 I believe radiation was recommended although the patient refused. I do believe that she has had any followup  Past medical history/problem list. #1 dementia, mild. #2 congestive heart failure, previous echo revealed mild left ventricular hypertrophy, EF of 50-55%. 3 hypertension. This is marginally controlled #4 probable peripheral vascular disease. #5 generalized osteoarthritis. #6 osteoporosis. #7; mild chronic renal insufficiency.   Labs; from 1/13 showed a BUN of 22 creatinine of 1.14 her BNP was 119 the rest of her comprehensive metabolic panel is essentially normal    Past Surgical History   Procedure  Laterality  Date   .  Breast surgery    RIGHT LUMPECTOMY    Medications list is reviewed. She is on acetaminophen 500 2 tablets twice daily, clonidine 0.3, twice a day, vitamin D 3 50,000 units monthly, Aricept 10 daily, ASA 81 daily, Cozaar, 100 mg daily, Lasix, 40 mg by mouth daily, potassium chloride 30 mEq by mouth daily, Namenda, XOR 28 mg daily, Lopressor, 3 tablets 75 mg by mouth twice daily.  Physical examination; Temperature 97.6-pulse  68-respirations 20-blood pressure blood pressure of 120/64 weight 203 pounds O2 sat is 94% on room air Respiratory clear entry bilaterally. Cardiac heart sounds are normal. No S3 no S4. No signs of congestive heart failure. Right breast; on the inferior aspect is a quarter size seborrheic keratosis. This is a skin lesion, not a intraparenchymal lesion and I think simply needs to be followed  Impression/plan #1 congestive heart failure. This appears to be well compensated. Does not seem to be any evidence #2 hypertension. Blood pressure control is good. She is on a aggressive regimen including clonidine 0.3 twice a day, Lasix 80 daily, Cardizem CD 240 daily, Cozaar 100 daily 10 Lopressor 75 twice a day. I note her renal ultrasound was suggested as a screen I suppose for renovascular disease although I don't see this result. #3 dementia on an aggressive regimen of Aricept and Namenda #4 CRF stage iii. Bun 24 Cr 1.16

## 2014-03-11 ENCOUNTER — Non-Acute Institutional Stay (SKILLED_NURSING_FACILITY): Payer: PRIVATE HEALTH INSURANCE | Admitting: Internal Medicine

## 2014-03-11 DIAGNOSIS — G309 Alzheimer's disease, unspecified: Secondary | ICD-10-CM

## 2014-03-11 DIAGNOSIS — N189 Chronic kidney disease, unspecified: Secondary | ICD-10-CM

## 2014-03-11 DIAGNOSIS — I509 Heart failure, unspecified: Secondary | ICD-10-CM

## 2014-03-11 DIAGNOSIS — F0280 Dementia in other diseases classified elsewhere without behavioral disturbance: Secondary | ICD-10-CM

## 2014-03-11 NOTE — Progress Notes (Signed)
Patient ID: Kaylee Turner, female   DOB: [birth year], 80 y.o.   MRN: [id number]      Facility; [facility]. Chief complaint; review of general medical issues, routine monthly [facility]care visit for March  History; this is a patient who was transferred here from [place] home. Patient has dementia, although she scores fairly well on the Folstein Mini-Mental Status she remains on Aricept and Namenda. She is able to ambulate in her room, uses a wheelchair to get around the facility. No major new issues are identified  She was noted to have a  3 cm dark flaky lesion at 7:00 on the right breast. She has a history of left breast cancer this was in February 2012. She had a right lumpectomy. It was elected to follow this area conservatively. Area in question is felt to be a seborrheic keratosis. After her right lumpectomy in 2012 I believe radiation was recommended although the patient refused. I do believe that she has had any followup  Past medical history/problem list. #1 dementia, mild. #2 congestive heart failure, previous echo revealed mild left ventricular hypertrophy, EF of 50-55%. 3 hypertension. This is marginally controlled #4 probable peripheral vascular disease. #5 generalized osteoarthritis. #6 osteoporosis. #7; mild chronic renal insufficiency.   Labs; from 1/13 showed a BUN of 22 creatinine of 1.14 her BNP was 119 the rest of her comprehensive metabolic panel is essentially normal. Hgb A1c on 3/25 was 6.1    Past Surgical History   Procedure  Laterality  Date   .  Breast surgery    RIGHT LUMPECTOMY    Medications list is reviewed: Os-Cal plus D5 100 mg daily Aricept 10 mg daily Enteric-coated aspirin 81 daily Cozaar 100 mg daily KCl 30 mEq daily Lasix 80 mg daily Diltiazem 240 daily Lopressor 25 mg 3 tablets twice daily Catapres 0.3 twice a day Namenda 10 mg twice daily Vitamin D3 50,000 units monthly  Physical examination; Vitals; temperature 97.6-pulse  68-respirations 20-blood pressure 120/64-weight 203 pounds-O2 sat 94% on room and Respiratory clear entry bilaterally. Cardiac heart sounds are normal. No S3 no S4. No signs of congestive heart failure. Right breast; on the inferior aspect is a quarter size seborrheic keratosis. This is a skin lesion, not a intraparenchymal lesion and I think simply needs to be followed  Impression/plan #1 congestive heart failure. This appears to be well compensated. Does not seem to be any evidence #2 hypertension. Blood pressure control is good. She is on a aggressive regimen including clonidine 0.3 twice a day, Lasix 80 daily, Cardizem CD 240 daily, Cozaar 100 daily 10 Lopressor 75 twice a day. I note her renal ultrasound was suggested as a screen I suppose for renovascular disease although I don't see this result. #3 dementia on an aggressive regimen of Aricept and Namenda #4 CRF stage iii. Bun 24 Cr 1.14

## 2014-04-17 ENCOUNTER — Non-Acute Institutional Stay (SKILLED_NURSING_FACILITY): Payer: PRIVATE HEALTH INSURANCE | Admitting: Internal Medicine

## 2014-04-17 DIAGNOSIS — I509 Heart failure, unspecified: Secondary | ICD-10-CM

## 2014-04-17 DIAGNOSIS — N189 Chronic kidney disease, unspecified: Secondary | ICD-10-CM

## 2014-04-17 NOTE — Progress Notes (Signed)
Patient ID: Kaylee Turner, female   DOB: [birth year], 80 y.o.   MRN: [id number]       Facility; [facility]. Chief complaint; review of general medical issues, routine monthly [facility] visit for April  History; this is a patient who was transferred here from [place] home. Patient has dementia, although she scores fairly well on the Folstein Mini-Mental Status she remains on Aricept and Namenda. She is able to ambulate in her room, uses a wheelchair to get around the facility. No major new issues are identified  She was noted to have a  3 cm dark flaky lesion at 7:00 on the right breast. She has a history of left breast cancer this was in February 2012. She had a right lumpectomy. It was elected to follow this area conservatively. Area in question is felt to be a seborrheic keratosis. After her right lumpectomy in 2012 I believe radiation was recommended although the patient refused. I do believe that she has had any followup  Past medical history/problem list. #1 dementia, mild. #2 congestive heart failure, previous echo revealed mild left ventricular hypertrophy, EF of 50-55%. 3 hypertension. This is marginally controlled #4 probable peripheral vascular disease. #5 generalized osteoarthritis. #6 osteoporosis. #7; mild chronic renal insufficiency.   Labs; from 5/1 showed a normal CBC. Comprehensive metabolic panel is also normal. TSH is 3.340    Past Surgical History   Procedure  Laterality  Date   .  Breast surgery    RIGHT LUMPECTOMY    Medications list is reviewed: Os-Cal plus D5 100 mg daily Aricept 10 mg daily Enteric-coated aspirin 81 daily Cozaar 100 mg daily KCl 30 mEq daily Lasix 80 mg daily Diltiazem 240 daily Lopressor 25 mg 3 tablets twice daily Catapres 0.3 twice a day Namenda 10 mg twice daily Vitamin D3 50,000 units monthly  Physical examination; Vitals; temperature temperature 98.1-pulse 54-respirations 20-blood pressure 153/70-weight 200  pounds-O2 sat 94% on room Respiratory clear entry bilaterally. Cardiac heart sounds are normal. No S3 no S4. No signs of congestive heart failure. Right breast; on the inferior aspect is a quarter size seborrheic keratosis. This is a skin lesion, not a intraparenchymal lesion and I think simply needs to be followed  Impression/plan #1 congestive heart failure. This appears to be well compensated. Does not seem to be any evidence #2 hypertension. Blood pressure control is good. She is on a aggressive regimen including clonidine 0.3 twice a day, Lasix 80 daily, Cardizem CD 240 daily, Cozaar 100 daily 10 Lopressor 75 twice a day. I note her renal ultrasound was suggested as a screen I suppose for renovascular disease although I don't see this result. #3 dementia on an aggressive regimen of Aricept and Namenda #4 CRF s recent BUN is 23 creatinine 0.99 GFR is 62.

## 2014-05-06 ENCOUNTER — Non-Acute Institutional Stay (SKILLED_NURSING_FACILITY): Payer: PRIVATE HEALTH INSURANCE | Admitting: Internal Medicine

## 2014-05-06 DIAGNOSIS — N189 Chronic kidney disease, unspecified: Secondary | ICD-10-CM

## 2014-05-06 DIAGNOSIS — I10 Essential (primary) hypertension: Secondary | ICD-10-CM

## 2014-05-08 NOTE — Progress Notes (Addendum)
Patient ID: Kaylee Turner, female   DOB: 10/17/1931, 80 y.o.   MRN: [id number]                 PROGRESS NOTE  DATE:  05/06/2014    FACILITY: [facility]    LEVEL OF CARE:   SNF   Routine Visit   CHIEF COMPLAINT:  Follow up medical issues/[facility] visit for May.    HISTORY OF PRESENT ILLNESS:  This is a patient who came to [place] from [place] home.  She has mild dementia.  She is still functional in her wheelchair around her room and in the facility, plays dominoes, etc.    She had her Lasix increased to 80 mg in the morning and 40 in the evening, largely as a consequence of poorly controlled high blood pressure.    She also has diastolically-mediated heart failure with an EF of 50-55%, although that appears to be not a major issue.         PAST MEDICAL HISTORY/PROBLEM LIST:  Mild dementia.    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      Hypertension, marginally controlled of late.    Probable peripheral vascular disease.    Generalized osteoarthritis.    Osteoporosis.    Mild chronic renal insufficiency.    Prior right lumpectomy.    PAST SURGICAL HISTORY:    Breast surgery, right lumpectomy.     CURRENT MEDICATIONS:  Medication list is reviewed.     Os-Cal 500 plus D, 1 tablet daily.    Aricept 10 q.d.    Bayer aspirin 81 q.d.    Cozaar 100 q.d.    KCl 30 mEq q.d.    Lasix 80 mg in the morning, now up to 40 mg in the evening.    Cardizem 240 q.d.     Lopressor 75 b.i.d.    Catapres 0.3 b.i.d.    Namenda 10 b.i.d.    Vitamin D, 50,000 U monthly.    PHYSICAL EXAMINATION:   VITAL SIGNS:   TEMPERATURE:  98.8.   PULSE:  55.   RESPIRATIONS:  17.   BLOOD PRESSURE:  168/80.   WEIGHT:  200 pounds.   O2 SATURATIONS:  95% on room air.   CHEST/RESPIRATORY:  Clear air entry bilaterally.   CARDIOVASCULAR:  CARDIAC:   Heart sounds are normal.  There are no murmurs.    BREASTS:  Right breast:  She has a  seborrheic keratosis on the inferior aspect of the right breast, which has caused the patient some concern.    However, this is not anything to do with the breast, per se.     ASSESSMENT/PLAN:  Hypertension.  Lasix has been increased.  She is on an extensive list of medications.  Has had a renal ultrasound as a screen for renovascular disease.   I do not have that result.    Diastolically-mediated congestive heart failure.  This is well compensated.    Chronic renal insufficiency.   If at all, this is mild.  Last creatinine was 0.99 on 03/23/2014.  Estimated GFR at 54.

## 2014-06-16 ENCOUNTER — Non-Acute Institutional Stay (SKILLED_NURSING_FACILITY): Payer: PRIVATE HEALTH INSURANCE | Admitting: Internal Medicine

## 2014-06-16 DIAGNOSIS — I509 Heart failure, unspecified: Secondary | ICD-10-CM

## 2014-06-16 DIAGNOSIS — N189 Chronic kidney disease, unspecified: Secondary | ICD-10-CM

## 2014-06-16 NOTE — Progress Notes (Signed)
Patient ID: Kaylee Turner, female   DOB: 12/08/1930, 80 y.o.   MRN: [id number]                  PROGRESS NOTE  DATE:  05/06/2014    FACILITY: [facility]    LEVEL OF CARE:   SNF   Routine Visit   CHIEF COMPLAINT:  Follow up medical issues/[employer] visit for June    HISTORY OF PRESENT ILLNESS:  This is a patient who came to [place] from [place] home.  She has mild dementia.  She is still functional in her wheelchair around her room and in the facility, plays dominoes, etc.    She had her Lasix increased to 80 mg in the morning and 40 in the evening, largely as a consequence of poorly controlled high blood pressure.    She also has diastolically-mediated heart failure with an EF of 50-55%, although that appears to be not a major issue.         PAST MEDICAL HISTORY/PROBLEM LIST:  Mild dementia.    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      Hypertension, marginally controlled of late.    Probable peripheral vascular disease.    Generalized osteoarthritis.    Osteoporosis.    Mild chronic renal insufficiency.    Prior right lumpectomy.    PAST SURGICAL HISTORY:    Breast surgery, right lumpectomy.     CURRENT MEDICATIONS:  Medication list is reviewed.     Os-Cal 500 plus D, 1 tablet daily.    Aricept 10 q.d.    Bayer aspirin 81 q.d.    Cozaar 100 q.d.    KCl 30 mEq q.d.    Lasix 80 mg bid  Cardizem 240 q.d.     Lopressor 75 b.i.d.    Catapres 0.3 b.i.d.    Namenda 10 b.i.d.    Vitamin D, 50,000 U monthly.    PHYSICAL EXAMINATION:   VITAL SIGNS:   TEMPERATURE:  98.8.   PULSE:  55.   RESPIRATIONS:  17.   BLOOD PRESSURE:  168/80.   WEIGHT:  200 pounds.   O2 SATURATIONS:  95% on room air.   CHEST/RESPIRATORY:  Clear air entry bilaterally.   CARDIOVASCULAR:  CARDIAC:   Heart sounds are normal.  There are no murmurs.    BREASTS:  Right breast:  She has a seborrheic keratosis on the inferior aspect of the  right breast, which has caused the patient some concern.    However, this is not anything to do with the breast, per se.     ASSESSMENT/PLAN:  Hypertension.  Lasix has been increased.  She is on an extensive list of medications.  Has had a renal ultrasound as a screen for renovascular disease.   I do not have that result.    Diastolically-mediated congestive heart failure.  This is well compensated.    Chronic renal insufficiency.   If at all, this is mild.  Last creatinine was 1.22 on 03/23/2014.  Estimated GFR at 48

## 2014-07-13 ENCOUNTER — Non-Acute Institutional Stay (SKILLED_NURSING_FACILITY): Payer: PRIVATE HEALTH INSURANCE | Admitting: Internal Medicine

## 2014-07-13 DIAGNOSIS — I509 Heart failure, unspecified: Secondary | ICD-10-CM

## 2014-07-13 DIAGNOSIS — I10 Essential (primary) hypertension: Secondary | ICD-10-CM

## 2014-07-14 NOTE — Progress Notes (Signed)
Patient ID: Kaylee Turner, female   DOB: May 05, 1931, 80 y.o.   MRN: [id number]                  PROGRESS NOTE  DATE:  07/14/2014   FACILITY: [facility]    LEVEL OF CARE:   SNF   Routine Visit   CHIEF COMPLAINT:  Follow up medical issues/[employer] visit for July   HISTORY OF PRESENT ILLNESS:  This is a patient who came to [place] from [place] home.  She has mild dementia.  She is still functional in her wheelchair around her room and in the facility, plays dominoes, etc.    She had her Lasix increased to 80 mg in the morning and 40 in the evening, largely as a consequence of poorly controlled high blood pressure.    She also has diastolically-mediated heart failure with an EF of 50-55%, although that appears to be not a major issue.        Lab work 4/30 white count 10.7 hemoglobin 12.1 BUN 23 creatinine 0.99 albumin 3.9 TSH 3.340 7/17 BUN 21 creatinine of 1.2 to sodium 142 potassium 4.3   PAST MEDICAL HISTORY/PROBLEM LIST:  Mild dementia.    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      Hypertension, marginally controlled of late.    Probable peripheral vascular disease.    Generalized osteoarthritis.    Osteoporosis.    Mild chronic renal insufficiency.    Prior right lumpectomy.    PAST SURGICAL HISTORY:    Breast surgery, right lumpectomy.     CURRENT MEDICATIONS:  Medication list is reviewed.     Os-Cal 500 plus D, 1 tablet daily.    Aricept 10 q.d.    Bayer aspirin 81 q.d.    Cozaar 100 q.d.    KCl 30 mEq q.d.    Lasix 80 mg bid  Cardizem 240 q.d.     Lopressor 75 b.i.d.    Catapres 0.3 b.i.d.    Namenda 10 b.i.d.    Vitamin D, 50,000 U monthly.    PHYSICAL EXAMINATION:   VITAL SIGNS:   TEMPERATURE:  98.8.   PULSE:  62   RESPIRATIONS:  17.   BLOOD PRESSURE: 130/70   WEIGHT:  200 pounds.  Which is unchanged O2 SATURATIONS:  95% on room air.   CHEST/RESPIRATORY:  Clear air entry bilaterally.     CARDIOVASCULAR:  CARDIAC:   Heart sounds are normal.  There are no murmurs.    ASSESSMENT/PLAN:  Hypertension.  Lasix has been increased.  She is on an extensive list of medications.  Has had a renal ultrasound as a screen for renovascular disease.   I do not have that result.    Diastolically-mediated congestive heart failure.  This is well compensated.    Chronic renal insufficiency.   Very stable

## 2014-08-21 ENCOUNTER — Non-Acute Institutional Stay (SKILLED_NURSING_FACILITY): Payer: PRIVATE HEALTH INSURANCE | Admitting: Internal Medicine

## 2014-08-21 DIAGNOSIS — N189 Chronic kidney disease, unspecified: Secondary | ICD-10-CM

## 2014-08-21 DIAGNOSIS — I509 Heart failure, unspecified: Secondary | ICD-10-CM

## 2014-08-21 NOTE — Progress Notes (Signed)
Patient ID: Kaylee Turner, female   DOB: 1931-02-07, 80 y.o.   MRN: [id number]                  PROGRESS NOTE  DATE:  08/21/2014  FACILITY[facility]    LEVEL OF CARE:   SNF   Routine Visit   CHIEF COMPLAINT:  Follow up medical issues/[employer] visit f  HISTORY OF PRESENT ILLNESS:  This is a patient who came to [place] from [place] home.  She has mild dementia.  She is still functional in her wheelchair around her room and in the facility, plays dominoes, etc.    She had her Lasix increased to 80 mg in the morning and 40 in the evening, largely as a consequence of poorly controlled high blood pressure.    She also has diastolically-mediated heart failure with an EF of 50-55%, although that appears to be not a major issue.    No major issues this month      Lab work 4/30 white count 10.7 hemoglobin 12.1 BUN 23 creatinine 0.99 albumin 3.9 TSH 3.340 7/17 BUN 21 creatinine of 1.2 to sodium 142 potassium 4.3   PAST MEDICAL HISTORY/PROBLEM LIST:  Mild dementia.    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      Hypertension, marginally controlled of late.    Probable peripheral vascular disease.    Generalized osteoarthritis.    Osteoporosis.    Mild chronic renal insufficiency.    Prior right lumpectomy.    PAST SURGICAL HISTORY:    Breast surgery, right lumpectomy.     CURRENT MEDICATIONS:  Medication list is reviewed.     Os-Cal 500 plus D, 1 tablet daily.    Aricept 10 q.d.    Bayer aspirin 81 q.d.    Cozaar 100 q.d.    KCl 30 mEq q.d.    Lasix 80 mg bid  Cardizem 240 q.d.     Lopressor 75 b.i.d.    Catapres 0.3 b.i.d.    Namenda 10 b.i.d.    Vitamin D, 50,000 U monthly.    PHYSICAL EXAMINATION:   VITAL SIGNS:   TEMPERATURE:  97  PULSE:  63  RESPIRATIONS:  17.   BLOOD PRESSURE: 150/86  WEIGHT:  200 pounds.  Which is unchanged O2 SATURATIONS:  95% on room air.   CHEST/RESPIRATORY:  Clear air entry  bilaterally.   CARDIOVASCULAR:  CARDIAC:   Heart sounds are normal.  There are no murmurs.    ASSESSMENT/PLAN:  Hypertension.  Lasix has been increased.  She is on an extensive list of medications.  Has had a renal ultrasound as a screen for renovascular disease.     Diastolically-mediated congestive heart failure.  This is well compensated.  Check BMP  Chronic renal insufficiency.   Very stable

## 2014-09-24 ENCOUNTER — Encounter: Payer: Self-pay | Admitting: Adult Health

## 2014-09-24 NOTE — Progress Notes (Signed)
This encounter was created in error - please disregard.

## 2014-09-28 ENCOUNTER — Non-Acute Institutional Stay (SKILLED_NURSING_FACILITY): Payer: PRIVATE HEALTH INSURANCE | Admitting: Internal Medicine

## 2014-09-28 ENCOUNTER — Encounter: Payer: Self-pay | Admitting: Internal Medicine

## 2014-09-28 DIAGNOSIS — I10 Essential (primary) hypertension: Secondary | ICD-10-CM

## 2014-09-28 DIAGNOSIS — I110 Hypertensive heart disease with heart failure: Secondary | ICD-10-CM

## 2014-09-28 DIAGNOSIS — N184 Chronic kidney disease, stage 4 (severe): Secondary | ICD-10-CM | POA: Insufficient documentation

## 2014-09-28 DIAGNOSIS — H2512 Age-related nuclear cataract, left eye: Secondary | ICD-10-CM

## 2014-09-28 DIAGNOSIS — D493 Neoplasm of unspecified behavior of breast: Secondary | ICD-10-CM

## 2014-09-28 DIAGNOSIS — I509 Heart failure, unspecified: Secondary | ICD-10-CM

## 2014-09-28 DIAGNOSIS — M150 Primary generalized (osteo)arthritis: Secondary | ICD-10-CM

## 2014-09-28 DIAGNOSIS — L03116 Cellulitis of left lower limb: Secondary | ICD-10-CM

## 2014-09-28 DIAGNOSIS — I739 Peripheral vascular disease, unspecified: Secondary | ICD-10-CM

## 2014-09-28 DIAGNOSIS — N183 Chronic kidney disease, stage 3 (moderate): Secondary | ICD-10-CM

## 2014-09-28 DIAGNOSIS — D72829 Elevated white blood cell count, unspecified: Secondary | ICD-10-CM

## 2014-09-28 DIAGNOSIS — F0390 Unspecified dementia without behavioral disturbance: Secondary | ICD-10-CM

## 2014-09-28 NOTE — Progress Notes (Signed)
Patient ID: HETHER Kaylee Turner, female   DOB: 1931/03/19, 80 y.o.   MRN: [id number]     Facility: [facility]   Code Status: DNR  Allergies  Allergen Reactions  . Levaquin [Levofloxacin In D5w]   . Penicillins   . Latex Rash    Causes blisters     Chief Complaint  Patient presents with  . New Admit To SNF     HPI:  80 y/o female patient is a new admission to the facility under [facility] care services from 09/22/14. She is here for long term care. She was residing in [facility] prior to this. She has PMH of HTN, CKD stage 3, CHF, dementia, PVD and OA among others. She has ongoing Lower Extremities venous stasis with some weeping, is getting skin care from wound care team and has been started on doxycycline for presumed cellulitis from 09/26/14. She moves around with a rollator walker, is alert and conversational with some confusion and denies any concerns when seen today. No new concern from staff. She needs assistance with ADLs She had her influenza vaccine 09/24/14, pneumococcal vaccine 11/23/1998  Review of Systems:  Constitutional: Negative for fever, chills, malaise/fatigue and diaphoresis.  HENT: Negative for congestion, Eyes: Negative for eye pain, blurred vision, double vision and discharge.  Respiratory: Negative for cough, sputum production, shortness of breath and wheezing.   Cardiovascular: Negative for chest pain, palpitations, orthopnea Gastrointestinal: Negative for heartburn, nausea, vomiting, abdominal pain, melena, rectal bleed, diarrhea and constipation. appetite is normal Genitourinary: Negative for dysuria, hematuria, flank pain.  Musculoskeletal: Negative for back pain, falls Skin: Negative for itching Neurological: Negative for weakness,dizziness,headaches.  Psychiatric/Behavioral: Negative for depression  Past Medical History  Diagnosis Date  . Cancer   . Hypertension   . Fibromyalgia    Past Surgical History  Procedure  Laterality Date  . Breast surgery  RIGHT LUMPECTOMY   Social History:   reports that she has quit smoking. She does not have any smokeless tobacco history on file. She reports that she does not drink alcohol or use illicit drugs.  Family History  Problem Relation Age of Onset  . Crohn's disease Daughter   . COPD Daughter     Medications: Patient's Medications  New Prescriptions   No medications on file  Previous Medications   ACETAMINOPHEN (TYLENOL) 500 MG TABLET    Take 1,000 mg by mouth 2 (two) times daily. DO NOT EXCEED 4 GM OF TYLENOL IN 24 HOURS   ASPIRIN EC 81 MG TABLET    Take 81 mg by mouth daily.     CALCIUM CARB-CHOLECALCIFEROL (OYSTER SHELL CALCIUM 500+D PO)    Take by mouth daily.   CHOLECALCIFEROL (VITAMIN D3) 50000 UNITS CAPS    Take 1 capsule by mouth every 30 (thirty) days. Take monthly on the 16 th.   CLONIDINE (CATAPRES) 0.3 MG TABLET    Take 0.3 mg by mouth 2 (two) times daily.   DILTIAZEM (DILACOR XR) 240 MG 24 HR CAPSULE    Take 240 mg by mouth daily.   DONEPEZIL (ARICEPT) 10 MG TABLET    Take 10 mg by mouth at bedtime.   FUROSEMIDE (LASIX) 80 MG TABLET    Take 80 mg by mouth 2 (two) times daily.   LOSARTAN (COZAAR) 100 MG TABLET    Take 100 mg by mouth daily.   MEMANTINE (NAMENDA) 10 MG TABLET    Take 10 mg by mouth 2 (two) times daily.   METOPROLOL TARTRATE (LOPRESSOR)  25 MG TABLET    Take 75 mg by mouth 2 (two) times daily.   POTASSIUM CHLORIDE (K-DUR) 10 MEQ TABLET    Take 3 tablets by mouth twice daily TAKE WITH FOOD, DO NOT CRUSH   SENNOSIDES-DOCUSATE SODIUM (SENOKOT-S) 8.6-50 MG TABLET    Take 1 tablet by mouth at bedtime.  Modified Medications   No medications on file  Discontinued Medications   No medications on file     Physical Exam: Filed Vitals:   09/28/14 0957  BP: 155/70  Pulse: 64  Temp: 97.2 F (36.2 C)  Resp: 18  Weight: 207 lb 9.6 oz (94.167 kg)  SpO2: 96%    General- elderly female in no acute distress, obese Head- atraumatic,  normocephalic Eyes- PERRLA, EOMI, no pallor, no icterus, no discharge Neck- no cervical lymphadenopathy, no thyromegaly Ears- normal external ears Throat- moist mucus membrane, normal oropharynx Nose- normal nasal mucosa, no maxillary or frontal sinus tenderness Cardiovascular- normal s1,s2, no murmurs/ rubs/ gallops, diminished dorsalis pedis, edema around ankles Respiratory- bilateral clear to auscultation except decreased air entry at bases, no wheeze, no rhonchi, no crackles, no use of accessory muscles Abdomen- bowel sounds present, soft, non tender Musculoskeletal- able to move all 4 extremities, uses a walker and self propels her wheelchair Neurological- no focal deficit, alert and conversational, has some confusion Skin- warm and dry, chronic venous staisis changes, crusting and weeping in RLE and mild erythema in LLE Psychiatry- normal mood and affect    Labs reviewed: 09/25/14 wbc 14.1, hb 13.8, hct 44.7, plt 336, na 139, k 3.8, bun 21, cr 1.1, glu 111, tsh 4.07, a1c 6.4 10/2/15wbc 12.2, hb 12.9, ct 38.9, mcv 92  Assessment/Plan  Leukocytosis Likely with her cellulitis. rechekc in 1 week. Monitor temp curve. No acute change of mental status, pending urine culture report  Cellulitis In LLE, complete course of doxcycline, recheck cbc with diff in 1 week, monitor temp curve  ckd stage 3 With her HTN, on cozaar monitor renal function, avoid NSAIDS  PVD Continue aspirin. Continue skin care with her venous stasis ulcer  CHF Stable, continue cozaar 100 mg qd, lopressor 75 mg bid and diltiazem 240 mg daily. Continue lasix 80 mg bid, monitor renal function and weight. Continue kcl supplement  HTN Elevated SBP today. Monitor bp q shift. Continue clonidine 0.3 mg bid with cozaar, lopressor  Dementia Stable, continue namenda 10 mg bid and aricept 10 mg daily  OA Continue vit d supplement  Left eye cataract Chronic, stable, poor surgical candidate. Continue routine eye  exam  Breast neoplasm Chronic, diagnosed in 2012 and declined radiation therapy in past, no further screening as per family/ care plan   Family/ staff Communication: reviewed care plan with patient and nursing supervisor   Goals of care: long term care   Labs/tests ordered: cbc with diff   Blanchie Serve, MD  [facility] [phone] (Monday-Friday 8 am - 5 pm) [phone] (afterhours)

## 2014-10-18 ENCOUNTER — Encounter: Payer: Self-pay | Admitting: Internal Medicine

## 2014-10-18 NOTE — Progress Notes (Signed)
This encounter was created in error - please disregard.

## 2014-11-12 ENCOUNTER — Non-Acute Institutional Stay (SKILLED_NURSING_FACILITY): Payer: PRIVATE HEALTH INSURANCE | Admitting: Internal Medicine

## 2014-11-12 ENCOUNTER — Encounter: Payer: Self-pay | Admitting: Internal Medicine

## 2014-11-12 DIAGNOSIS — F0390 Unspecified dementia without behavioral disturbance: Secondary | ICD-10-CM

## 2014-11-12 DIAGNOSIS — I739 Peripheral vascular disease, unspecified: Secondary | ICD-10-CM

## 2014-11-12 DIAGNOSIS — I509 Heart failure, unspecified: Secondary | ICD-10-CM

## 2014-11-12 DIAGNOSIS — F411 Generalized anxiety disorder: Secondary | ICD-10-CM

## 2014-11-12 NOTE — Progress Notes (Signed)
Patient ID: CARISMA TROUPE, female   DOB: 28-Aug-1931, 80 y.o.   MRN: [id number]    Facility[facility]  Code status- DNR  Allergies- penicillin, levaquin, latex  Chief complaint- medical management of chronic issues  HPI 80 y/o female patient is seen for routine visit. Her left lower extremity wound has healed. She has been started on prn xanax for anxiety per family request. Her mood has been stable. She has dementia but able to carry out pleasant conversation. She denies any concerns when seen today. No new concern from staff.  Review of Systems Constitutional: Negative for fever, chills, malaise/fatigue and diaphoresis.  Respiratory: Negative for cough, shortness of breath and wheezing.   Cardiovascular: Negative for chest pain, palpitations Gastrointestinal: Negative for heartburn, nausea, vomiting, abdominal pain Genitourinary: Negative for dysuria  Musculoskeletal: Negative for falls Skin: Negative for itching Neurological: Negative for weakness,dizziness,headaches.  Psychiatric/Behavioral: Negative for depression  Past Medical History  Diagnosis Date  . Cancer   . Hypertension   . Fibromyalgia    Medication reviewed. See [facility]  Physical exam BP 140/77 mmHg  Pulse 68  Temp(Src) 98.2 F (36.8 C)  Resp 18  SpO2 95%  General- elderly female in no acute distress, obese Head- atraumatic, normocephalic Neck- no cervical lymphadenopathy Throat- moist mucus membrane Cardiovascular- normal s1,s2, no murmurs, trace edema Respiratory- bilateral clear to auscultation except decreased air entry at bases, no wheeze, no rhonchi, no crackles, no use of accessory muscles Abdomen- bowel sounds present, soft, non tender Musculoskeletal- able to move all 4 extremities, uses a walker and self propels her wheelchair Neurological- no focal deficit, alert and conversational, has some confusion Skin- warm and dry, chronic venous stasis changes,  RLE wrapped and LLE has mild erythema Psychiatry- normal mood and affect  Labs reviewed: 10/25/14 wbc 9.8,hb 12.3, hct 40.1, mcv 98.3, plt 252 10/29/14- ABI normal 09/25/14 wbc 14.1, hb 13.8, hct 44.7, plt 336, na 139, k 3.8, bun 21, cr 1.1, glu 111, tsh 4.07, a1c 6.4 10/2/15wbc 12.2, hb 12.9, ct 38.9, mcv 92  Assessment/Plan  Dementia without behavioral disturbance Continue aricept and namenda, decline anticipated, continue to monitor weight, po intake, monitor for skin breakdown  PVD Continue aspirin. Continue skin care with her venous stasis  CHF Stable, continue cozaar 100 mg qd, lopressor 75 mg bid, lasix 80 mg bid with kcl supplement.  monitor renal function and weight.   Anxiety Under control, currently on xanax 0.25 mg q6h prn anxiety    Blanchie Serve, MD  [facility] [phone] (Monday-Friday 8 am - 5 pm) [phone] (afterhours)

## 2014-12-06 ENCOUNTER — Non-Acute Institutional Stay (SKILLED_NURSING_FACILITY): Payer: Medicare Other | Admitting: Internal Medicine

## 2014-12-06 DIAGNOSIS — F0390 Unspecified dementia without behavioral disturbance: Secondary | ICD-10-CM

## 2014-12-06 DIAGNOSIS — I739 Peripheral vascular disease, unspecified: Secondary | ICD-10-CM

## 2014-12-06 DIAGNOSIS — I10 Essential (primary) hypertension: Secondary | ICD-10-CM

## 2014-12-15 NOTE — Progress Notes (Signed)
Patient ID: Kaylee Turner, female   DOB: 21-Jan-1931, 81 y.o.   MRN: [id number]    Facility: [facility]  Code status- DNR  Allergies- penicillin, levaquin, latex  Chief complaint- medical management of chronic issues  HPI 81 y/o female patient is seen for routine visit. She has dementia and is on namenda and aricept. She is tolerating it well. She denies any concerns when seen today. No new concern from staff.  Review of Systems Constitutional: Negative for fever, chills, malaise/fatigue and diaphoresis.   Respiratory: Negative for cough, shortness of breath and wheezing.    Cardiovascular: Negative for chest pain, palpitations Gastrointestinal: Negative for heartburn, nausea, vomiting, abdominal pain Genitourinary: Negative for dysuria   Musculoskeletal: Negative for falls Skin: Negative for itching Neurological: Negative for weakness,dizziness,headaches.   Psychiatric/Behavioral: Negative for depression    Past Medical History   Diagnosis  Date   .  Cancer     .  Hypertension     .  Fibromyalgia      Medication reviewed. See [facility]  Physical exam BP 140/65 mmHg  Pulse 63  Temp(Src) 99 F (37.2 C)  Resp 18  SpO2 96%  General- elderly female in no acute distress, obese Head- atraumatic, normocephalic Neck- no cervical lymphadenopathy Throat- moist mucus membrane Cardiovascular- normal s1,s2, no murmurs, trace edema Respiratory- bilateral clear to auscultation except decreased air entry at bases, no wheeze, no rhonchi, no crackles, no use of accessory muscles Abdomen- bowel sounds present, soft, non tender Musculoskeletal- able to move all 4 extremities, uses a walker and self propels her wheelchair Neurological- no focal deficit, alert and conversational, has some confusion Skin- warm and dry, chronic venous stasis changes Psychiatry- normal mood and affect  Labs reviewed: 10/25/14 wbc 9.8,hb 12.3, hct 40.1, mcv 98.3, plt  252 10/29/14- ABI normal 09/25/14 wbc 14.1, hb 13.8, hct 44.7, plt 336, na 139, k 3.8, bun 21, cr 1.1, glu 111, tsh 4.07, a1c 6.4 10/2/15wbc 12.2, hb 12.9, ct 38.9, mcv 92  Assessment/Plan  HTN bp stable. Continue cozaar 100 mg daily with lopressor 75 mg bid and clonidine 0.3 mg bid. monitor bp  PVD Continue aspirin. Continue lasix 80 mg bid. Continue skin care with her venous stasis  Dementia without behavioral disturbance Continue aricept and namenda, decline anticipated, continue to monitor weight, po intake, monitor for skin breakdown. Continue calcium and vitamin d supplement

## 2015-01-17 ENCOUNTER — Non-Acute Institutional Stay (SKILLED_NURSING_FACILITY): Payer: Medicare Other | Admitting: Internal Medicine

## 2015-01-17 DIAGNOSIS — F0390 Unspecified dementia without behavioral disturbance: Secondary | ICD-10-CM

## 2015-01-17 DIAGNOSIS — I509 Heart failure, unspecified: Secondary | ICD-10-CM | POA: Diagnosis not present

## 2015-01-17 DIAGNOSIS — I110 Hypertensive heart disease with heart failure: Secondary | ICD-10-CM

## 2015-01-17 DIAGNOSIS — N183 Chronic kidney disease, stage 3 (moderate): Secondary | ICD-10-CM | POA: Diagnosis not present

## 2015-01-27 NOTE — Progress Notes (Signed)
Patient ID: Kaylee Turner, female   DOB: January 21, 1931, 81 y.o.   MRN: [id number]    Facility: [facility]  Code status- DNR  Allergies- penicillin, levaquin, latex  Chief complaint- medical management of chronic issues  HPI 81 y/o female patient is seen for routine visit. She has dementia and is on namenda and aricept. She is tolerating it well. She denies any concerns when seen today. No new concern from staff. She has been off restorative therapy. bp has been stable. She is off oxybutynin which was started for OAB as per family request.  Review of Systems Constitutional: Negative for fever, chills, diaphoresis.   Respiratory: Negative for cough, shortness of breath and wheezing.    Cardiovascular: Negative for chest pain, palpitations Gastrointestinal: Negative for heartburn, nausea, vomiting, abdominal pain Genitourinary: Negative for dysuria   Musculoskeletal: Negative for falls Skin: Negative for itching Neurological: Negative for weakness,dizziness,headaches.   Psychiatric/Behavioral: Negative for depression  Past Medical History  Diagnosis Date  . Cancer   . Hypertension   . Fibromyalgia    Medication reviewed. See [facility]  Physical exam BP 100/73 mmHg  Pulse 76  Temp(Src) 98.1 F (36.7 C)  Resp 18  SpO2 98%  General- elderly female in no acute distress, obese Head- atraumatic, normocephalic Neck- no cervical lymphadenopathy Throat- moist mucus membrane Cardiovascular- normal s1,s2, no murmurs, trace edema Respiratory- bilateral clear to auscultation, no wheeze, no rhonchi, no crackles, no use of accessory muscles Abdomen- bowel sounds present, soft, non tender Musculoskeletal- able to move all 4 extremities, self propels her wheelchair, kyphosis present Neurological- no focal deficit, alert and conversational, has some confusion Skin- warm and dry, chronic venous stasis changes Psychiatry- normal mood and affect  Labs  reviewed: 10/25/14 wbc 9.8,hb 12.3, hct 40.1, mcv 98.3, plt 252 10/29/14- ABI normal 09/25/14 wbc 14.1, hb 13.8, hct 44.7, plt 336, na 139, k 3.8, bun 21, cr 1.1, glu 111, tsh 4.07, a1c 6.4 10/2/15wbc 12.2, hb 12.9, ct 38.9, mcv 92  Assessment/Plan  ckd stage 3 bp is controlled. Continue losartan and monitor renal function  chf Stable, monitor weight, continue cozaar, metoprolol tartrate, lasix and kcl supplement  Dementia without behavioral disturbance decline anticipated, continue to monitor weight, po intake, monitor for skin breakdown. Continue calcium and vitamin d supplement. Continue namenda and aricept

## 2015-02-11 ENCOUNTER — Non-Acute Institutional Stay (SKILLED_NURSING_FACILITY): Payer: Medicare Other | Admitting: Internal Medicine

## 2015-02-11 DIAGNOSIS — C50919 Malignant neoplasm of unspecified site of unspecified female breast: Secondary | ICD-10-CM | POA: Insufficient documentation

## 2015-02-11 DIAGNOSIS — I110 Hypertensive heart disease with heart failure: Secondary | ICD-10-CM | POA: Diagnosis not present

## 2015-02-11 DIAGNOSIS — I739 Peripheral vascular disease, unspecified: Secondary | ICD-10-CM | POA: Diagnosis not present

## 2015-02-11 DIAGNOSIS — E876 Hypokalemia: Secondary | ICD-10-CM

## 2015-02-11 DIAGNOSIS — F411 Generalized anxiety disorder: Secondary | ICD-10-CM | POA: Insufficient documentation

## 2015-02-11 DIAGNOSIS — I509 Heart failure, unspecified: Secondary | ICD-10-CM | POA: Diagnosis not present

## 2015-02-11 NOTE — Progress Notes (Signed)
Patient ID: Kaylee Turner, female   DOB: December 10, 1930, 81 y.o.   MRN: [id number]    Facility: [facility]  Code status- DNR  Allergies- penicillin, levaquin, latex  Chief complaint- medical management of chronic issues  HPI 81 y/o female patient is seen for routine visit. She appears comfortable in her bed and is getting her hair done today. Her legs have healed well. She refuses compression stocking for her legs and has chronic edema. Weight remains stable. No acute behavioral concerns. has dementia and is on namenda and aricept. Does not want any further workup/ exam for her breast cancer  Review of Systems Constitutional: Negative for fever, chills, diaphoresis.   Respiratory: Negative for cough, shortness of breath and wheezing.    Cardiovascular: Negative for chest pain, palpitations Gastrointestinal: Negative for heartburn, nausea, vomiting, abdominal pain Genitourinary: Negative for dysuria   Musculoskeletal: Negative for falls Skin: Negative for itching Neurological: Negative for weakness,dizziness,headaches.   Psychiatric/Behavioral: Negative for depression  Medication reviewed. See MAR  Past medical history reviewed  Physical exam BP 130/76 mmHg  Pulse 76  Temp(Src) 98 F (36.7 C)  Resp 18  Wt 215 lb 6.4 oz (97.705 kg)  SpO2 95%  General- elderly female in no acute distress, obese Head- atraumatic, normocephalic Neck- no cervical lymphadenopathy Throat- moist mucus membrane Cardiovascular- normal s1,s2, no murmurs, trace edema Respiratory- bilateral clear to auscultation, no wheeze, no rhonchi, no crackles, no use of accessory muscles Abdomen- bowel sounds present, soft, non tender Musculoskeletal- able to move all 4 extremities, self propels her wheelchair, kyphosis present Neurological- no focal deficit, alert and conversational, has some confusion Skin- warm and dry, chronic venous stasis changes Psychiatry- normal  mood and affect  Labs reviewed: 01/28/15 wbc 9.8, hb 12.1 01/22/15 wbc 11, hb 12.9, plt 314, ca 9.2, ast 17, alp 100, alt 23, a1c 6.4 10/25/14 wbc 9.8,hb 12.3, hct 40.1, mcv 98.3, plt 252 10/29/14- ABI normal 09/25/14 wbc 14.1, hb 13.8, hct 44.7, plt 336, na 139, k 3.8, bun 21, cr 1.1, glu 111, tsh 4.07, a1c 6.4 10/2/15wbc 12.2, hb 12.9, ct 38.9, mcv 92  Assessment/Plan  Anxiety On zoloft 25 mg daily, this has been helpful. Continue this with xanax 0.25 mg q6h prn  PVD With venous stasis, on baby aspirin. Her leg wounds have healed. Has trace edema but refuses ted hose. Continue skin check. Continue lasix.  Malignant neoplasm of female breast Diagnosed in 72Y, treatment uncertain, no further evaluation desired by patient and her responsible party. To monitor clinically  HTN bp has been controlled. continue losartan 100 mg daily, lopressor 75 mg bid, catapres 0.3 mg bid with cardizem 240 mg daily. bp has been stable and weight has been stable, decrease lasix to 60 mg bid for now and monitor.   Hypokalemia On kcl 30 meq bid, with reduction in dosing of lasix, decrease kcl to 20 meq bid for now. Recheck bmp in 2 weeks

## 2015-03-11 ENCOUNTER — Other Ambulatory Visit: Payer: Self-pay | Admitting: *Deleted

## 2015-03-11 MED ORDER — ALPRAZOLAM 0.25 MG PO TABS: ORAL_TABLET | ORAL | Status: DC

## 2015-03-11 NOTE — Telephone Encounter (Signed)
[facility] 

## 2015-03-14 ENCOUNTER — Non-Acute Institutional Stay (SKILLED_NURSING_FACILITY): Payer: Medicare Other | Admitting: Internal Medicine

## 2015-03-14 DIAGNOSIS — F39 Unspecified mood [affective] disorder: Secondary | ICD-10-CM

## 2015-03-14 DIAGNOSIS — I509 Heart failure, unspecified: Secondary | ICD-10-CM

## 2015-03-14 DIAGNOSIS — N189 Chronic kidney disease, unspecified: Secondary | ICD-10-CM

## 2015-03-14 DIAGNOSIS — I130 Hypertensive heart and chronic kidney disease with heart failure and stage 1 through stage 4 chronic kidney disease, or unspecified chronic kidney disease: Secondary | ICD-10-CM

## 2015-03-14 DIAGNOSIS — I5032 Chronic diastolic (congestive) heart failure: Secondary | ICD-10-CM | POA: Diagnosis not present

## 2015-03-14 DIAGNOSIS — F411 Generalized anxiety disorder: Secondary | ICD-10-CM | POA: Diagnosis not present

## 2015-03-14 NOTE — Progress Notes (Signed)
Patient ID: MARGARETTA CHITTUM, female   DOB: 09-25-1931, 81 y.o.   MRN: [id number]    Facility: [facility]  Code status- DNR  Allergies- penicillin, levaquin, latex  Chief complaint- medical management of chronic issues  HPI 81 y/o female patient is seen for routine visit. She is sitting on her wheelchair and denies any concerns this visit. She continues to refus compression stocking for her legs and has chronic edema. dementia at baseline. She had a fall this month while trying to get out of bed unassisted to use the restroom. No injury reported. Weight remains stable. Followed by [facility]. Elevated bp reading today.  Review of Systems Constitutional: Negative for fever, chills, diaphoresis.   Respiratory: Negative for cough, shortness of breath and wheezing.    Cardiovascular: Negative for chest pain, palpitations Gastrointestinal: Negative for heartburn, nausea, vomiting, abdominal pain Genitourinary: Negative for dysuria   Musculoskeletal: Negative for falls Skin: Negative for itching Neurological: Negative for weakness,dizziness,headaches.   Psychiatric/Behavioral: Negative for depression  Medication reviewed. See MAR  Past medical history reviewed  Physical exam BP 156/68 mmHg  Pulse 72  Temp(Src) 98 F (36.7 C)  Resp 18  SpO2 96%  BP Readings from Last 3 Encounters:  03/14/15 156/68  02/11/15 130/76  01/17/15 100/73   General- elderly female in no acute distress, obese Head- atraumatic, normocephalic Neck- no cervical lymphadenopathy Throat- moist mucus membrane Cardiovascular- normal s1,s2, no murmurs, 1+ edema Respiratory- bilateral clear to auscultation, no wheeze, no rhonchi, no crackles, no use of accessory muscles Abdomen- bowel sounds present, soft, non tender Musculoskeletal- able to move all 4 extremities, self propels her wheelchair, kyphosis present Neurological- no focal deficit, alert and conversational, has  some confusion Skin- warm and dry, chronic venous stasis changes Psychiatry- normal mood and affect  Labs reviewed: 01/28/15 wbc 9.8, hb 12.1 01/22/15 wbc 11, hb 12.9, plt 314, ca 9.2, ast 17, alp 100, alt 23, a1c 6.4 10/25/14 wbc 9.8,hb 12.3, hct 40.1, mcv 98.3, plt 252 10/29/14- ABI normal 09/25/14 wbc 14.1, hb 13.8, hct 44.7, plt 336, na 139, k 3.8, bun 21, cr 1.1, glu 111, tsh 4.07, a1c 6.4 10/2/15wbc 12.2, hb 12.9, ct 38.9, mcv 92  Assessment/Plan  Hypertensive heart and renal disease Continue lasix 60 mg bid, losartan 100 mg daily, lopressor 75 mg bid. Monitor bp readings and consider introducing hydralazine if bp remains elevated  Chronic diastolic CHF Chronic leg edema, refusing compression hose. Continue lopressor, losartan and lasix current regimen, monitor weight  GAD zoloft recently increased to 75 mg daily and xanax 0.25 mg added to afternoon. Also on prn xanax. Continue this regimen  Mood disorder Tolerating lamictal 50 mg daily at present. Monitor

## 2015-04-10 ENCOUNTER — Non-Acute Institutional Stay (SKILLED_NURSING_FACILITY): Payer: Medicare Other | Admitting: Internal Medicine

## 2015-04-10 DIAGNOSIS — F03918 Unspecified dementia, unspecified severity, with other behavioral disturbance: Secondary | ICD-10-CM

## 2015-04-10 DIAGNOSIS — I10 Essential (primary) hypertension: Secondary | ICD-10-CM

## 2015-04-10 DIAGNOSIS — F0391 Unspecified dementia with behavioral disturbance: Secondary | ICD-10-CM

## 2015-04-10 DIAGNOSIS — E669 Obesity, unspecified: Secondary | ICD-10-CM

## 2015-04-10 NOTE — Progress Notes (Signed)
Patient ID: Kaylee Turner, female   DOB: [birth year], 81 y.o.   MRN: [id number]      Facility: [facility]  Code status- DNR  Allergies- penicillin, levaquin, latex  Chief complaint- medical management of chronic issues  HPI 81 y/o female patient is seen for routine visit. She is sitting on her wheelchair and denies any concerns this visit. Followed by [facility]. Elevated bp reading today. On review, readings, 160/66, 106/68, 141/76, 146/76, 166/66, 141/98. Has lost weight on review of weight chart.  Review of Systems Constitutional: Negative for fever, chills, diaphoresis.   Respiratory: Negative for cough, shortness of breath and wheezing.    Cardiovascular: Negative for chest pain, palpitations Gastrointestinal: Negative for heartburn, nausea, vomiting, abdominal pain Genitourinary: Negative for dysuria   Musculoskeletal: Negative for falls Skin: Negative for itching Neurological: Negative for weakness,dizziness,headaches.   Psychiatric/Behavioral: Negative for depression  Medication reviewed. See MAR  Past medical history reviewed  Physical exam BP 143/73 mmHg  Pulse 76  Temp(Src) 98.6 F (37 C)  Resp 18  Ht 5\' 2"  (1.575 m)  Wt 202 lb 3.2 oz (91.717 kg)  BMI 36.97 kg/m2  SpO2 99%  BP Readings from Last 3 Encounters:  04/10/15 143/73  03/14/15 156/68  02/11/15 130/76   Wt Readings from Last 3 Encounters:  04/10/15 202 lb 3.2 oz (91.717 kg)  02/11/15 215 lb 6.4 oz (97.705 kg)  09/28/14 207 lb 9.6 oz (94.167 kg)   General- elderly female in no acute distress, obese Head- atraumatic, normocephalic Neck- no cervical lymphadenopathy Throat- moist mucus membrane Cardiovascular- normal s1,s2, no murmurs, 1+ edema Respiratory- bilateral clear to auscultation, no wheeze, no rhonchi, no crackles, no use of accessory muscles Abdomen- bowel sounds present, soft, non tender Musculoskeletal- able to move all 4 extremities, self  propels her wheelchair, kyphosis present Neurological- no focal deficit, alert and conversational, has some confusion Skin- warm and dry, chronic venous stasis changes Psychiatry- normal mood and affect  Labs reviewed: 01/28/15 wbc 9.8, hb 12.1 01/22/15 wbc 11, hb 12.9, plt 314, ca 9.2, ast 17, alp 100, alt 23, a1c 6.4 10/25/14 wbc 9.8,hb 12.3, hct 40.1, mcv 98.3, plt 252 10/29/14- ABI normal 09/25/14 wbc 14.1, hb 13.8, hct 44.7, plt 336, na 139, k 3.8, bun 21, cr 1.1, glu 111, tsh 4.07, a1c 6.4 10/2/15wbc 12.2, hb 12.9, ct 38.9, mcv 92  Assessment/Plan  Uncontrolled hypertension Elevated bp on review. Add hydralazine 10 mg tid for now and in 2 weeks if reading is elevated, change to 25 mg bid. Continue lasix 60 mg bid, losartan 100 mg daily, lopressor 75 mg bid and clonidine 0.3 mg bid  Obesity Has lost weight compared to last visit. Continue NAS diet  Dementia with behavioral disturbance Calm this visit. No acute behavior changes reported. On namenda and aricept. D/c them and for ease of administration for the patient, change to namzaric 28-10 one capsule daily in the evening.  Continue zoloft 75 mg daily with lamictal 240 mg daily   Blanchie Serve, MD  [facility] [phone] (Monday-Friday 8 am - 5 pm) [phone] (afterhours)

## 2015-05-01 ENCOUNTER — Non-Acute Institutional Stay (SKILLED_NURSING_FACILITY): Payer: Medicare Other | Admitting: Internal Medicine

## 2015-05-01 DIAGNOSIS — I5032 Chronic diastolic (congestive) heart failure: Secondary | ICD-10-CM | POA: Diagnosis not present

## 2015-05-01 DIAGNOSIS — N183 Chronic kidney disease, stage 3 unspecified: Secondary | ICD-10-CM

## 2015-05-01 DIAGNOSIS — I130 Hypertensive heart and chronic kidney disease with heart failure and stage 1 through stage 4 chronic kidney disease, or unspecified chronic kidney disease: Secondary | ICD-10-CM

## 2015-05-01 DIAGNOSIS — I509 Heart failure, unspecified: Secondary | ICD-10-CM | POA: Diagnosis not present

## 2015-05-01 DIAGNOSIS — N189 Chronic kidney disease, unspecified: Secondary | ICD-10-CM | POA: Diagnosis not present

## 2015-05-01 NOTE — Progress Notes (Signed)
Patient ID: Kaylee Turner, female   DOB: [birth year], 81 y.o.   MRN: [id number]    [facility]  Code Status: DNR  Chief Complaint  Patient presents with  . Medical Management of Chronic Issues    Routine Visit    Allergies- penicillin, levaquin, latex  HPI 81 y/o female patient is seen for routine visit. She is sitting on her wheelchair and denies any concerns this visit. bp reading has improved with hydralazine 10 mg tid. Tolerating namzeric well. Weight is stable. Denies any concerns.  Review of Systems Constitutional: Negative for fever, chills, diaphoresis.   Respiratory: Negative for cough, shortness of breath and wheezing.    Cardiovascular: Negative for chest pain, palpitations Gastrointestinal: Negative for heartburn, nausea, vomiting, abdominal pain Genitourinary: Negative for dysuria   Musculoskeletal: Negative for falls Skin: Negative for itching Neurological: Negative for weakness,dizziness,headaches.   Psychiatric/Behavioral: Negative for depression  Medication reviewed. See [facility]   Medication List       This list is accurate as of: 05/01/15  3:49 PM.  Always use your most recent med list.               acetaminophen 500 MG tablet  Commonly known as:  TYLENOL  Take 1,000 mg by mouth 2 (two) times daily. DO NOT EXCEED 4 GM OF TYLENOL IN 24 HOURS     ALPRAZolam 0.25 MG tablet  Commonly known as:  XANAX  Take one tablet by mouth daily at 2pm for anxiety; Take one tablet by mouth every 6 hours as needed for anxiety/agitation     aspirin EC 81 MG tablet  Take 81 mg by mouth daily.     cloNIDine 0.3 MG tablet  Commonly known as:  CATAPRES  Take 0.3 mg by mouth 2 (two) times daily.     diltiazem 240 MG 24 hr capsule  Commonly known as:  DILACOR XR  Take 240 mg by mouth daily.     furosemide 20 MG tablet  Commonly known as:  LASIX  Take 80 mg by mouth 2 (two) times daily.     hydrALAZINE 10 MG tablet  Commonly known as:  APRESOLINE    Take 10 mg by mouth 3 (three) times daily.     lamoTRIgine 25 MG tablet  Commonly known as:  LAMICTAL  Take 50 mg by mouth daily.     losartan 100 MG tablet  Commonly known as:  COZAAR  Take 100 mg by mouth daily.     metoprolol tartrate 25 MG tablet  Commonly known as:  LOPRESSOR  Take 75 mg by mouth 2 (two) times daily.     NAMZARIC 28-10 MG Cp24  Generic drug:  Memantine HCl-Donepezil HCl  Take by mouth daily.     OYSTER SHELL CALCIUM 500+D PO  Take by mouth daily.     potassium chloride SA 20 MEQ tablet  Commonly known as:  K-DUR,KLOR-CON  Take 20 mEq by mouth 2 (two) times daily.     sennosides-docusate sodium 8.6-50 MG tablet  Commonly known as:  SENOKOT-S  Take 1 tablet by mouth at bedtime.     sertraline 25 MG tablet  Commonly known as:  ZOLOFT  Take 75 mg by mouth daily.     Vitamin D-3 1000 UNITS Caps  Take by mouth daily.        Past medical history reviewed  Physical exam BP 143/72 mmHg  Pulse 81  Temp(Src) 97 F (36.1 C)  Resp 18  Wt 216 lb 9.6 oz (98.249 kg)  SpO2 93%  BP Readings from Last 3 Encounters:  05/01/15 143/72  04/10/15 143/73  03/14/15 156/68   Wt Readings from Last 3 Encounters:  05/01/15 216 lb 9.6 oz (98.249 kg)  04/10/15 202 lb 3.2 oz (91.717 kg)  02/11/15 215 lb 6.4 oz (97.705 kg)   General- elderly female in no acute distress, obese Head- atraumatic, normocephalic Neck- no cervical lymphadenopathy Throat- moist mucus membrane Cardiovascular- normal s1,s2, no murmurs, 1+ edema Respiratory- bilateral clear to auscultation, no wheeze, no rhonchi, no crackles, no use of accessory muscles Abdomen- bowel sounds present, soft, non tender Musculoskeletal- able to move all 4 extremities, self propels her wheelchair, kyphosis present, trace ankle edema Neurological- no focal deficit, alert and conversational, has some confusion Skin- warm and dry, chronic venous stasis changes Psychiatry- normal mood and affect  Labs  reviewed: 01/28/15 wbc 9.8, hb 12.1 01/22/15 wbc 11, hb 12.9, plt 314, ca 9.2, ast 17, alp 100, alt 23, a1c 6.4 10/25/14 wbc 9.8,hb 12.3, hct 40.1, mcv 98.3, plt 252 10/29/14- ABI normal 09/25/14 wbc 14.1, hb 13.8, hct 44.7, plt 336, na 139, k 3.8, bun 21, cr 1.1, glu 111, tsh 4.07, a1c 6.4 10/2/15wbc 12.2, hb 12.9, ct 38.9, mcv 92  Assessment/Plan  Hypertensive heart disease Improved bp reading on review. Continue hydralazine 10 mg tid, lasix 60 mg bid, losartan 100 mg daily, lopressor 75 mg bid and clonidine 0.3 mg bid. Continue to monitor bp  ckd stage 3 Monitor renal function, on cozaar for renla protection  Chronic diastolic heart failure Stable, weight is up and back to her baseline. i feel the weight last month was an error. Continue lasix 60 mg bid with losartan 100 mg daily and lopressor 75 mg bid for now. Monitor weight   Blanchie Serve, MD  [facility] [phone] (Monday-Friday 8 am - 5 pm) [phone] (afterhours)

## 2015-05-02 LAB — BASIC METABOLIC PANEL
BUN: 20 mg/dL (ref 4–21)
Creatinine: 1.2 mg/dL — AB (ref 0.5–1.1)
GLUCOSE: 113 mg/dL
Potassium: 3.8 mmol/L (ref 3.4–5.3)
Sodium: 143 mmol/L (ref 137–147)

## 2015-05-02 LAB — CBC AND DIFFERENTIAL
HEMATOCRIT: 36 % (ref 36–46)
Hemoglobin: 11.4 g/dL — AB (ref 12.0–16.0)
PLATELETS: 284 10*3/uL (ref 150–399)
WBC: 9.8 10^3/mL

## 2015-05-02 LAB — HEPATIC FUNCTION PANEL
ALT: 22 U/L (ref 7–35)
AST: 13 U/L (ref 13–35)
Alkaline Phosphatase: 101 U/L (ref 25–125)
BILIRUBIN, TOTAL: 0.3 mg/dL

## 2015-05-02 LAB — HEMOGLOBIN A1C: HEMOGLOBIN A1C: 6.7 % — AB (ref 4.0–6.0)

## 2015-05-06 LAB — HM DIABETES EYE EXAM

## 2015-06-04 ENCOUNTER — Non-Acute Institutional Stay (SKILLED_NURSING_FACILITY): Payer: Medicare Other | Admitting: Internal Medicine

## 2015-06-04 DIAGNOSIS — R600 Localized edema: Secondary | ICD-10-CM | POA: Diagnosis not present

## 2015-06-04 DIAGNOSIS — E119 Type 2 diabetes mellitus without complications: Secondary | ICD-10-CM | POA: Insufficient documentation

## 2015-06-04 DIAGNOSIS — F329 Major depressive disorder, single episode, unspecified: Secondary | ICD-10-CM | POA: Diagnosis not present

## 2015-06-04 DIAGNOSIS — N183 Chronic kidney disease, stage 3 (moderate): Secondary | ICD-10-CM

## 2015-06-04 DIAGNOSIS — F0280 Dementia in other diseases classified elsewhere without behavioral disturbance: Secondary | ICD-10-CM

## 2015-06-04 DIAGNOSIS — F0393 Unspecified dementia, unspecified severity, with mood disturbance: Secondary | ICD-10-CM | POA: Insufficient documentation

## 2015-06-04 NOTE — Progress Notes (Signed)
Patient ID: Kaylee Turner, female   DOB: 12/08/1930, 81 y.o.   MRN: [id number]       [facility]  Code Status: DNR  Chief Complaint  Patient presents with  . Medical Management of Chronic Issues   Allergies- penicillin, levaquin, latex  HPI 81 y/o female patient is seen for routine visit. She is sitting on her wheelchair and denies any concerns this visit. No new concerns from staff. No new behavioral changes. Bowel movement stable with senokot. Dementia at baseline. On review of labs, has new onset DM with a1c 6.7  Review of Systems Constitutional: Negative for fever, chills, diaphoresis.   Respiratory: Negative for cough, shortness of breath and wheezing.    Cardiovascular: Negative for chest pain, palpitations Gastrointestinal: Negative for heartburn, nausea, vomiting, abdominal pain Genitourinary: Negative for dysuria   Musculoskeletal: Negative for falls Skin: Negative for itching Neurological: Negative for weakness,dizziness,headaches.   Psychiatric/Behavioral: Negative for depression  Medication reviewed. See [facility]   Medication List       This list is accurate as of: 06/04/15  5:44 PM.  Always use your most recent med list.               acetaminophen 500 MG tablet  Commonly known as:  TYLENOL  Take 1,000 mg by mouth 2 (two) times daily. DO NOT EXCEED 4 GM OF TYLENOL IN 24 HOURS     ALPRAZolam 0.25 MG tablet  Commonly known as:  XANAX  Take one tablet by mouth daily at 2pm for anxiety; Take one tablet by mouth every 6 hours as needed for anxiety/agitation     aspirin EC 81 MG tablet  Take 81 mg by mouth daily.     cloNIDine 0.3 MG tablet  Commonly known as:  CATAPRES  Take 0.3 mg by mouth 2 (two) times daily.     diltiazem 240 MG 24 hr capsule  Commonly known as:  DILACOR XR  Take 240 mg by mouth daily.     furosemide 20 MG tablet  Commonly known as:  LASIX  Take 80 mg by mouth 2 (two) times daily.     hydrALAZINE 10 MG tablet    Commonly known as:  APRESOLINE  Take 10 mg by mouth 3 (three) times daily.     lamoTRIgine 25 MG tablet  Commonly known as:  LAMICTAL  Take 50 mg by mouth daily.     losartan 100 MG tablet  Commonly known as:  COZAAR  Take 100 mg by mouth daily.     metoprolol tartrate 25 MG tablet  Commonly known as:  LOPRESSOR  Take 75 mg by mouth 2 (two) times daily.     NAMZARIC 28-10 MG Cp24  Generic drug:  Memantine HCl-Donepezil HCl  Take by mouth daily.     OYSTER SHELL CALCIUM 500+D PO  Take by mouth daily.     potassium chloride SA 20 MEQ tablet  Commonly known as:  K-DUR,KLOR-CON  Take 20 mEq by mouth 2 (two) times daily.     sennosides-docusate sodium 8.6-50 MG tablet  Commonly known as:  SENOKOT-S  Take 1 tablet by mouth at bedtime.     sertraline 25 MG tablet  Commonly known as:  ZOLOFT  Take 75 mg by mouth daily.     Vitamin D-3 1000 UNITS Caps  Take by mouth daily.        Past medical history reviewed  Physical exam BP 138/62 mmHg  Pulse 60  Temp(Src) 97.8  F (36.6 C)  Resp 18  Ht 5\' 2"  (1.575 m)  Wt 213 lb 12.8 oz (96.979 kg)  BMI 39.09 kg/m2  SpO2 100%  BP Readings from Last 3 Encounters:  06/04/15 138/62  05/01/15 143/72  04/10/15 143/73   Wt Readings from Last 3 Encounters:  06/04/15 213 lb 12.8 oz (96.979 kg)  05/01/15 216 lb 9.6 oz (98.249 kg)  04/10/15 202 lb 3.2 oz (91.717 kg)   General- elderly female in no acute distress, obese Head- atraumatic, normocephalic Neck- no cervical lymphadenopathy Throat- moist mucus membrane Cardiovascular- normal s1,s2, no murmurs, 1+ edema Respiratory- bilateral clear to auscultation, no wheeze, no rhonchi, no crackles, no use of accessory muscles Abdomen- bowel sounds present, soft, non tender Musculoskeletal- able to move all 4 extremities, self propels her wheelchair, kyphosis present, trace ankle edema Neurological- no focal deficit, alert and conversational, has some confusion Skin- warm and dry,  chronic venous stasis changes Psychiatry- normal mood and affect  Labs reviewed: 05/04/15 e.coli uti 05/02/15 wbc 9.8, hb 11.4, hct 35.6, plt 284, na 143, k 3.8, bun 20, cr 1.24, lft wnl, a1c 6.7 01/28/15 wbc 9.8, hb 12.1 01/22/15 wbc 11, hb 12.9, plt 314, ca 9.2, ast 17, alp 100, alt 23, a1c 6.4 10/25/14 wbc 9.8,hb 12.3, hct 40.1, mcv 98.3, plt 252 10/29/14- ABI normal 09/25/14 wbc 14.1, hb 13.8, hct 44.7, plt 336, na 139, k 3.8, bun 21, cr 1.1, glu 111, tsh 4.07, a1c 6.4 10/2/15wbc 12.2, hb 12.9, ct 38.9, mcv 92  Assessment/Plan  New onset DM in obese Has obesity. Given her age and co-morbidities, a1c goal should be < 7. a1c 6.7 in recent lab. Will have her cbg checked twice a week for now. Will need diabetic diet. Will get dietary consult for her. continue aspirin. Check lipid panel and urine microalbumin. Continue losartan. Monitor weekly weight for now. Check a1c in 3 months  ckd stage 3 Monitor renal function, on cozaar for renal protection, check urine microalbumin: creatinine to assess for diabetic nephropathy  Depression with dementia Stable, continue zoloft 75 mg daily.continue namzaric.   Chronic leg edema Refuses ted hose or leg wrap, continue lasix 80 mg bid, monitor bmp   Blanchie Serve, MD  [facility] [phone] (Monday-Friday 8 am - 5 pm) [phone] (afterhours)

## 2015-06-05 LAB — HM DIABETES FOOT EXAM

## 2015-07-08 ENCOUNTER — Non-Acute Institutional Stay (SKILLED_NURSING_FACILITY): Payer: Medicare Other | Admitting: Internal Medicine

## 2015-07-08 ENCOUNTER — Encounter: Payer: Self-pay | Admitting: Internal Medicine

## 2015-07-08 DIAGNOSIS — I739 Peripheral vascular disease, unspecified: Secondary | ICD-10-CM | POA: Diagnosis not present

## 2015-07-08 DIAGNOSIS — M150 Primary generalized (osteo)arthritis: Secondary | ICD-10-CM | POA: Diagnosis not present

## 2015-07-08 DIAGNOSIS — N189 Chronic kidney disease, unspecified: Secondary | ICD-10-CM

## 2015-07-08 DIAGNOSIS — I130 Hypertensive heart and chronic kidney disease with heart failure and stage 1 through stage 4 chronic kidney disease, or unspecified chronic kidney disease: Secondary | ICD-10-CM | POA: Diagnosis not present

## 2015-07-08 DIAGNOSIS — I509 Heart failure, unspecified: Secondary | ICD-10-CM

## 2015-07-08 DIAGNOSIS — I5032 Chronic diastolic (congestive) heart failure: Secondary | ICD-10-CM | POA: Diagnosis not present

## 2015-07-08 NOTE — Progress Notes (Signed)
Patient ID: Kaylee Turner, female   DOB: 02/15/1931, 81 y.o.   MRN: [id number]   [facility], [employer] [facility]   Code Status: DNR  Chief Complaint  Patient presents with  . Medical Management of Chronic Issues    Routine Visit     Allergies  Allergen Reactions  . Levaquin [Levofloxacin In D5w]   . Penicillins   . Latex Rash    Causes blisters     HPI 81 y/o female patient is seen for routine visit. She is sitting on her wheelchair and denies any concerns this visit. Non complaint with her diet as per staff. Refusing ted hose for leg edema. Complaint with her medication. bp reading reviewed with range 130-150/60-70.   Review of Systems Constitutional: Negative for fever, chills, diaphoresis.   Respiratory: Negative for cough, shortness of breath and wheezing.    Cardiovascular: Negative for chest pain, palpitations Gastrointestinal: Negative for heartburn, nausea, vomiting, abdominal pain Genitourinary: Negative for dysuria   Musculoskeletal: Negative for falls Skin: Negative for itching Neurological: Negative for weakness,dizziness,headaches.   Psychiatric/Behavioral: Negative for depression  Medication reviewed. See [facility]   Medication List       This list is accurate as of: 07/08/15 11:59 PM.  Always use your most recent med list.               acetaminophen 500 MG tablet  Commonly known as:  TYLENOL  Take 1,000 mg by mouth 2 (two) times daily. DO NOT EXCEED 4 GM OF TYLENOL IN 24 HOURS     ALPRAZolam 0.25 MG tablet  Commonly known as:  XANAX  Take one tablet by mouth daily at 2pm for anxiety; Take one tablet by mouth every 6 hours as needed for anxiety/agitation     aspirin EC 81 MG tablet  Take 81 mg by mouth daily.     cloNIDine 0.3 MG tablet  Commonly known as:  CATAPRES  Take 0.3 mg by mouth 2 (two) times daily.     diltiazem 240 MG 24 hr capsule  Commonly known as:  DILACOR XR  Take 240 mg by mouth daily.     furosemide 20 MG  tablet  Commonly known as:  LASIX  Take 60 mg by mouth 2 (two) times daily.     hydrALAZINE 10 MG tablet  Commonly known as:  APRESOLINE  Take 10 mg by mouth 3 (three) times daily.     lamoTRIgine 25 MG tablet  Commonly known as:  LAMICTAL  Take 50 mg by mouth daily.     losartan 100 MG tablet  Commonly known as:  COZAAR  Take 100 mg by mouth daily.     metoprolol tartrate 25 MG tablet  Commonly known as:  LOPRESSOR  Take 75 mg by mouth 2 (two) times daily.     NAMZARIC 28-10 MG Cp24  Generic drug:  Memantine HCl-Donepezil HCl  Take by mouth daily.     OYSTER SHELL CALCIUM 500+D PO  Take by mouth daily.     potassium chloride SA 20 MEQ tablet  Commonly known as:  K-DUR,KLOR-CON  Take 20 mEq by mouth 2 (two) times daily.     sennosides-docusate sodium 8.6-50 MG tablet  Commonly known as:  SENOKOT-S  Take 1 tablet by mouth at bedtime.     sertraline 25 MG tablet  Commonly known as:  ZOLOFT  Take 75 mg by mouth daily.     Vitamin D-3 1000 UNITS Caps  Take by mouth  daily.        Past medical history reviewed  Physical exam BP 105/55 mmHg  Pulse 53  Temp(Src) 98.1 F (36.7 C) (Oral)  Resp 22  Ht 5\' 2"  (1.575 m)  Wt 213 lb (96.616 kg)  BMI 38.95 kg/m2  SpO2 95%  BP Readings from Last 3 Encounters:  07/08/15 105/55  06/04/15 138/62  05/01/15 143/72   Wt Readings from Last 3 Encounters:  07/08/15 213 lb (96.616 kg)  06/04/15 213 lb 12.8 oz (96.979 kg)  05/01/15 216 lb 9.6 oz (98.249 kg)   General- elderly female in no acute distress, obese Head- atraumatic, normocephalic Neck- no cervical lymphadenopathy Throat- moist mucus membrane Cardiovascular- normal s1,s2, no murmurs, 1+ edema Respiratory- bilateral clear to auscultation, no wheeze, no rhonchi, no crackles, no use of accessory muscles Abdomen- bowel sounds present, soft, non tender Musculoskeletal- able to move all 4 extremities, self propels her wheelchair, kyphosis present Neurological- no  focal deficit, alert and conversational, has some confusion Skin- warm and dry, chronic venous stasis changes Psychiatry- normal mood and affect  Labs reviewed: 05/04/15 e.coli uti 05/02/15 wbc 9.8, hb 11.4, hct 35.6, plt 284, na 143, k 3.8, bun 20, cr 1.24, lft wnl, a1c 6.7 01/28/15 wbc 9.8, hb 12.1 01/22/15 wbc 11, hb 12.9, plt 314, ca 9.2, ast 17, alp 100, alt 23, a1c 6.4   Assessment/Plan  Hypertension Few elevated bp reading, monitor vitals, continue current bp medication clonidine 0.3 mg bid, lasix 60 mg bid, hydralazine 10 mg tid, losartan 100 mg daily and lopressor 75 mg bid  Chronic diastolic chf Euvolemic. Continue lopressor 75 mg bid, cozaar 100 mg daily, hydralazine 10 mg tid and laisx 60 mg bid, monitor bmp.   PVD Refuses ted hose or leg wraps. Continue baby aspirin and lasix to help with edema  OA Stable, chronic and is on scheduled tylenol, continue this  Blanchie Serve, MD  [facility] Adult Medicine [phone] (Monday-Friday 8 am - 5 pm) [phone] (afterhours)

## 2015-07-25 LAB — HEMOGLOBIN A1C: HEMOGLOBIN A1C: 6.4

## 2015-08-04 DIAGNOSIS — I130 Hypertensive heart and chronic kidney disease with heart failure and stage 1 through stage 4 chronic kidney disease, or unspecified chronic kidney disease: Secondary | ICD-10-CM | POA: Insufficient documentation

## 2015-08-04 DIAGNOSIS — I5032 Chronic diastolic (congestive) heart failure: Secondary | ICD-10-CM | POA: Insufficient documentation

## 2015-08-08 ENCOUNTER — Non-Acute Institutional Stay (SKILLED_NURSING_FACILITY): Payer: Medicare Other | Admitting: Internal Medicine

## 2015-08-08 DIAGNOSIS — I739 Peripheral vascular disease, unspecified: Secondary | ICD-10-CM

## 2015-08-08 DIAGNOSIS — E1122 Type 2 diabetes mellitus with diabetic chronic kidney disease: Secondary | ICD-10-CM | POA: Diagnosis not present

## 2015-08-08 DIAGNOSIS — F339 Major depressive disorder, recurrent, unspecified: Secondary | ICD-10-CM | POA: Diagnosis not present

## 2015-08-08 DIAGNOSIS — F0150 Vascular dementia without behavioral disturbance: Secondary | ICD-10-CM

## 2015-08-08 DIAGNOSIS — R809 Proteinuria, unspecified: Secondary | ICD-10-CM | POA: Diagnosis not present

## 2015-08-08 DIAGNOSIS — N189 Chronic kidney disease, unspecified: Secondary | ICD-10-CM | POA: Diagnosis not present

## 2015-08-08 NOTE — Progress Notes (Signed)
Patient ID: Kaylee Turner, female   DOB: 04/07/1931, 81 y.o.   MRN: [id number]    [facility], [facility] [facility]   Code Status: DNR  Chief Complaint  Patient presents with  . Medical Management of Chronic Issues    Allergies  Allergen Reactions  . Levaquin [Levofloxacin In D5w]   . Penicillins   . Latex Rash    Causes blisters     HPI 81 y/o female patient is seen for routine visit. She denies any concern this visit. No new concern from nursing staff. She is sitting on her wheelchair. She had a blister to her left lower extremity that has resolved. Her dementia is at baseline. Mood has been stable. Leg swelling has remained stable. Anxiety medication is helpful. No falls reported. Her labs reviewed with a1c s/o prediabetes and microlabuminuria present  Review of Systems Constitutional: Negative for fever, chills, diaphoresis.   Respiratory: Negative for cough, shortness of breath and wheezing.    Cardiovascular: Negative for chest pain, palpitations Gastrointestinal: Negative for heartburn, nausea, vomiting, abdominal pain Genitourinary: Negative for dysuria   Musculoskeletal: Negative for falls Skin: Negative for itching Neurological: Negative for weakness,dizziness,headaches.   Psychiatric/Behavioral: Negative for depression  Medication reviewed. See [facility]   Medication List       This list is accurate as of: 08/08/15  2:13 PM.  Always use your most recent med list.               acetaminophen 500 MG tablet  Commonly known as:  TYLENOL  Take 1,000 mg by mouth 2 (two) times daily. DO NOT EXCEED 4 GM OF TYLENOL IN 24 HOURS     ALPRAZolam 0.25 MG tablet  Commonly known as:  XANAX  Take one tablet by mouth daily at 2pm for anxiety; Take one tablet by mouth every 6 hours as needed for anxiety/agitation     aspirin EC 81 MG tablet  Take 81 mg by mouth daily.     cloNIDine 0.3 MG tablet  Commonly known as:  CATAPRES  Take 0.3 mg by mouth 2 (two)  times daily.     diltiazem 240 MG 24 hr capsule  Commonly known as:  DILACOR XR  Take 240 mg by mouth daily.     furosemide 20 MG tablet  Commonly known as:  LASIX  Take 60 mg by mouth 2 (two) times daily.     hydrALAZINE 10 MG tablet  Commonly known as:  APRESOLINE  Take 10 mg by mouth 3 (three) times daily.     lamoTRIgine 25 MG tablet  Commonly known as:  LAMICTAL  Take 50 mg by mouth daily.     losartan 100 MG tablet  Commonly known as:  COZAAR  Take 100 mg by mouth daily.     metoprolol tartrate 25 MG tablet  Commonly known as:  LOPRESSOR  Take 75 mg by mouth 2 (two) times daily.     NAMZARIC 28-10 MG Cp24  Generic drug:  Memantine HCl-Donepezil HCl  Take by mouth daily.     OYSTER SHELL CALCIUM 500+D PO  Take by mouth daily.     potassium chloride SA 20 MEQ tablet  Commonly known as:  K-DUR,KLOR-CON  Take 20 mEq by mouth 2 (two) times daily.     sennosides-docusate sodium 8.6-50 MG tablet  Commonly known as:  SENOKOT-S  Take 1 tablet by mouth at bedtime.     sertraline 25 MG tablet  Commonly known as:  ZOLOFT  Take 75 mg by mouth daily.     Vitamin D-3 1000 UNITS Caps  Take by mouth daily.        Past medical history reviewed  Physical exam BP 140/76 mmHg  Pulse 70  Temp(Src) 98 F (36.7 C)  Resp 18  SpO2 95%  BP Readings from Last 3 Encounters:  08/08/15 140/76  07/08/15 105/55  06/04/15 138/62   Wt Readings from Last 3 Encounters:  07/08/15 213 lb (96.616 kg)  06/04/15 213 lb 12.8 oz (96.979 kg)  05/01/15 216 lb 9.6 oz (98.249 kg)   General- elderly female in no acute distress, obese Head- atraumatic, normocephalic Neck- no cervical lymphadenopathy Throat- moist mucus membrane Cardiovascular- normal s1,s2, no murmurs, 1+ edema Respiratory- bilateral clear to auscultation, no wheeze, no rhonchi, no crackles, no use of accessory muscles Abdomen- bowel sounds present, soft, non tender Musculoskeletal- able to move all 4 extremities,  self propels her wheelchair, kyphosis present Neurological- no focal deficit, alert and conversational, has some confusion Skin- warm and dry, chronic venous stasis changes Psychiatry- normal mood and affect  Labs reviewed: 07/25/15 a1c 6.4 06/07/15 urine microalbuminuria positive 05/04/15 e.coli uti 05/02/15 wbc 9.8, hb 11.4, hct 35.6, plt 284, na 143, k 3.8, bun 20, cr 1.24, lft wnl, a1c 6.7 01/28/15 wbc 9.8, hb 12.1 01/22/15 wbc 11, hb 12.9, plt 314, ca 9.2, ast 17, alp 100, alt 23, a1c 6.4   Assessment/Plan  Diabetes mellitus with ckd a1c 6.4. Monitor clinically for now. Calorie count and encourage healthy food. Monitor weight  Microalbuminuria Noted. Currently on losartan 100 mg daily, continue this for renal protection  Vascular Dementia At baseline and tolerating namzaric well, continue this  Leg edema/PVD Stable, from PVD. continue lasix 60 mg bid and monitor  Depression Stable mood, continue lamictal 50 mg daily with zoloft 75 mg daily   Blanchie Serve, MD  [facility] Adult Medicine [phone] (Monday-Friday 8 am - 5 pm) [phone] (afterhours)

## 2015-09-26 ENCOUNTER — Non-Acute Institutional Stay (SKILLED_NURSING_FACILITY): Payer: Medicare Other | Admitting: Internal Medicine

## 2015-09-26 DIAGNOSIS — F418 Other specified anxiety disorders: Secondary | ICD-10-CM | POA: Diagnosis not present

## 2015-09-26 DIAGNOSIS — I10 Essential (primary) hypertension: Secondary | ICD-10-CM | POA: Diagnosis not present

## 2015-09-26 DIAGNOSIS — N390 Urinary tract infection, site not specified: Secondary | ICD-10-CM

## 2015-09-26 NOTE — Progress Notes (Signed)
Patient ID: Kaylee Turner, female   DOB: 1931-11-16, 81 y.o.   MRN: [id number]    [facility], [employer] [facility]   Code Status: DNR  Chief Complaint  Patient presents with  . Medical Management of Chronic Issues    Allergies  Allergen Reactions  . Levaquin [Levofloxacin In D5w]   . Penicillins   . Latex Rash    Causes blisters     HPI 81 y/o female patient is seen for routine visit. She has been at her baseline. No new concern from nursing staff. she was treated for UTI on October and is currently symptom free. She is tolerating cranberry supplement well. Her dementia is at baseline. Mood has been stable.   Review of Systems Constitutional: Negative for fever, chills, diaphoresis.   Respiratory: Negative for cough, shortness of breath and wheezing.    Cardiovascular: Negative for chest pain, palpitations Gastrointestinal: Negative for heartburn, nausea, vomiting, abdominal pain Genitourinary: Negative for dysuria   Musculoskeletal: Negative for falls Skin: Negative for itching Neurological: Negative for weakness,dizziness,headaches.   Psychiatric/Behavioral: Negative for depression  Medication reviewed. See [facility]   Medication List       This list is accurate as of: 09/26/15  1:58 PM.  Always use your most recent med list.               acetaminophen 500 MG tablet  Commonly known as:  TYLENOL  Take 1,000 mg by mouth 2 (two) times daily. DO NOT EXCEED 4 GM OF TYLENOL IN 24 HOURS     ALPRAZolam 0.25 MG tablet  Commonly known as:  XANAX  Take one tablet by mouth daily at 2pm for anxiety; Take one tablet by mouth every 6 hours as needed for anxiety/agitation     aspirin EC 81 MG tablet  Take 81 mg by mouth daily.     cloNIDine 0.3 MG tablet  Commonly known as:  CATAPRES  Take 0.3 mg by mouth 2 (two) times daily.     diltiazem 240 MG 24 hr capsule  Commonly known as:  DILACOR XR  Take 240 mg by mouth daily.     furosemide 20 MG tablet    Commonly known as:  LASIX  Take 60 mg by mouth 2 (two) times daily.     hydrALAZINE 10 MG tablet  Commonly known as:  APRESOLINE  Take 10 mg by mouth 3 (three) times daily.     lamoTRIgine 25 MG tablet  Commonly known as:  LAMICTAL  Take 50 mg by mouth daily.     losartan 100 MG tablet  Commonly known as:  COZAAR  Take 100 mg by mouth daily.     metoprolol tartrate 25 MG tablet  Commonly known as:  LOPRESSOR  Take 75 mg by mouth 2 (two) times daily.     NAMZARIC 28-10 MG Cp24  Generic drug:  Memantine HCl-Donepezil HCl  Take by mouth daily.     OYSTER SHELL CALCIUM 500+D PO  Take by mouth daily.     potassium chloride SA 20 MEQ tablet  Commonly known as:  K-DUR,KLOR-CON  Take 20 mEq by mouth 2 (two) times daily.     sennosides-docusate sodium 8.6-50 MG tablet  Commonly known as:  SENOKOT-S  Take 1 tablet by mouth at bedtime.     sertraline 25 MG tablet  Commonly known as:  ZOLOFT  Take 75 mg by mouth daily.     Vitamin D-3 1000 UNITS Caps  Take by mouth  daily.        Past medical history reviewed  Physical exam BP 129/62 mmHg  Pulse 76  Temp(Src) 98.2 F (36.8 C)  Resp 18  SpO2 94%  BP Readings from Last 3 Encounters:  09/26/15 129/62  08/08/15 140/76  07/08/15 105/55   General- elderly female in no acute distress, obese Head- atraumatic, normocephalic Neck- no cervical lymphadenopathy Throat- moist mucus membrane Cardiovascular- normal s1,s2, no murmurs, 1+ edema Respiratory- bilateral clear to auscultation, no wheeze, no rhonchi, no crackles, no use of accessory muscles Abdomen- bowel sounds present, soft, non tender Musculoskeletal- able to move all 4 extremities, self propels her wheelchair, kyphosis present Neurological- no focal deficit, alert and conversational, has some confusion Skin- warm and dry, chronic venous stasis changes Psychiatry- normal mood and affect  Labs reviewed: 07/25/15 a1c 6.4 06/07/15 urine microalbuminuria  positive 05/04/15 e.coli uti 05/02/15 wbc 9.8, hb 11.4, hct 35.6, plt 284, na 143, k 3.8, bun 20, cr 1.24, lft wnl, a1c 6.7 01/28/15 wbc 9.8, hb 12.1 01/22/15 wbc 11, hb 12.9, plt 314, ca 9.2, ast 17, alp 100, alt 23, a1c 6.4   Assessment/Plan  Proteus UTI Completed antibiotic treamtnet and is asymptomatic at present, monitor clinically, continue cranberry supplement with recurrent uti  HTN Stable bp, continue lopressor 75 mg bid, losartan 100 mg daily, lasix 60 mg bid and clonidine 0.3 mg bid and monitor bp  Depression and anxiety Stable mood, continue lamictal 50 mg daily with zoloft 75 mg daily for mood. Continue xanax 0.25 mg at noon and then q6h prn for anxiety.   Blanchie Serve, MD  [facility] Adult Medicine [phone] (Monday-Friday 8 am - 5 pm) [phone] (afterhours)

## 2015-10-28 LAB — BASIC METABOLIC PANEL
BUN: 25 mg/dL — AB (ref 4–21)
CREATININE: 1.2 mg/dL — AB (ref <=1.1)
Glucose: 114 mg/dL
Potassium: 3.7 mmol/L (ref 3.4–5.3)
Sodium: 147 mmol/L (ref 137–147)

## 2015-12-02 ENCOUNTER — Other Ambulatory Visit: Payer: Self-pay | Admitting: *Deleted

## 2015-12-02 MED ORDER — ALPRAZOLAM 0.25 MG PO TABS: ORAL_TABLET | ORAL | Status: DC

## 2015-12-02 NOTE — Telephone Encounter (Signed)
[facility] 

## 2015-12-05 ENCOUNTER — Non-Acute Institutional Stay (SKILLED_NURSING_FACILITY): Payer: Medicare Other | Admitting: Internal Medicine

## 2015-12-05 DIAGNOSIS — F329 Major depressive disorder, single episode, unspecified: Secondary | ICD-10-CM | POA: Diagnosis not present

## 2015-12-05 DIAGNOSIS — I10 Essential (primary) hypertension: Secondary | ICD-10-CM

## 2015-12-05 DIAGNOSIS — W19XXXA Unspecified fall, initial encounter: Secondary | ICD-10-CM | POA: Diagnosis not present

## 2015-12-05 DIAGNOSIS — R410 Disorientation, unspecified: Secondary | ICD-10-CM

## 2015-12-05 DIAGNOSIS — I5032 Chronic diastolic (congestive) heart failure: Secondary | ICD-10-CM | POA: Diagnosis not present

## 2015-12-05 LAB — CBC AND DIFFERENTIAL
HEMATOCRIT: 39 % (ref 36–46)
HEMOGLOBIN: 12.2 g/dL (ref 12.0–16.0)
Platelets: 313 10*3/uL (ref 150–399)
WBC: 11.4 10^3/mL

## 2015-12-05 NOTE — Progress Notes (Signed)
Patient ID: Kaylee Turner, female   DOB: February 14, 1931, 82 y.o.   MRN: [id number]      [facility], [employer] [facility]   Code Status: DNR  Chief Complaint  Patient presents with  . Medical Management of Chronic Issues    Medical Management of Chronic Issues. [employer]    Allergies  Allergen Reactions  . Levaquin [Levofloxacin In D5w]   . Penicillins   . Latex Rash    Causes blisters     HPI 82 y/o female patient is seen for routine visit. She had a fall this am. Per staff, she has been more confused and gets agitated easily. She was seen by psychiatry services yesterday for this. She was recently treated for UTI. She was also started on antibiotics this am for possible UTI by on call provider.    Review of Systems Constitutional: Negative for fever, chills   Respiratory: Negative for cough, shortness of breath and wheezing.    Cardiovascular: Negative for chest pain, palpitations Gastrointestinal: Negative for heartburn, nausea, vomiting, abdominal pain Genitourinary: Negative for dysuria   Musculoskeletal: Negative for falls Skin: Negative for itching Neurological: Negative for dizziness   Medication reviewed. See [facility]   Medication List       This list is accurate as of: 12/05/15 12:02 PM.  Always use your most recent med list.               acetaminophen 500 MG tablet  Commonly known as:  TYLENOL  Take 1,000 mg by mouth 2 (two) times daily. DO NOT EXCEED 4 GM OF TYLENOL IN 24 HOURS     ALPRAZolam 0.25 MG tablet  Commonly known as:  XANAX  Take one tablet by mouth daily at 2pm for anxiety. Take one tablet by mouth every 8 hours as needed for anxiety. Do not exceed 4 doses in 24hrs.     aspirin EC 81 MG tablet  Take 81 mg by mouth daily.     cloNIDine 0.3 MG tablet  Commonly known as:  CATAPRES  Take 0.3 mg by mouth 2 (two) times daily.     Cranberry 400 MG Caps  Take one capsule by mouth twice daily for recurrent UTI     diltiazem 240 MG 24  hr capsule  Commonly known as:  DILACOR XR  Take 240 mg by mouth daily.     furosemide 20 MG tablet  Commonly known as:  LASIX  Take 60 mg by mouth 2 (two) times daily.     hydrALAZINE 50 MG tablet  Commonly known as:  APRESOLINE  Take one tablet by mouth three times daily     lamoTRIgine 25 MG tablet  Commonly known as:  LAMICTAL  Take 50 mg by mouth daily.     losartan 100 MG tablet  Commonly known as:  COZAAR  Take 100 mg by mouth daily.     metoprolol tartrate 25 MG tablet  Commonly known as:  LOPRESSOR  Take 75 mg by mouth 2 (two) times daily.     NAMZARIC 28-10 MG Cp24  Generic drug:  Memantine HCl-Donepezil HCl  Take by mouth daily.     nitrofurantoin (macrocrystal-monohydrate) 100 MG capsule  Commonly known as:  MACROBID  Take one capsule by mouth every 12 hours for 7 days (start 12/05/15)     nystatin cream  Commonly known as:  MYCOSTATIN  Apply to rash under right breast twice daily after drying well     OYSTER SHELL CALCIUM  500+D PO  Take by mouth daily.     potassium chloride SA 20 MEQ tablet  Commonly known as:  K-DUR,KLOR-CON  Take 20 mEq by mouth 2 (two) times daily.     saccharomyces boulardii 250 MG capsule  Commonly known as:  FLORASTOR  Take one capsule by mouth every 12 hours for 21 days (start 12/05/15)     sennosides-docusate sodium 8.6-50 MG tablet  Commonly known as:  SENOKOT-S  Take 1 tablet by mouth at bedtime.     sertraline 100 MG tablet  Commonly known as:  ZOLOFT  Take one tablet by mouth once daily for depression     Vitamin D-3 1000 units Caps  Take by mouth daily.        Past medical history reviewed  Physical exam BP 162/69 mmHg  Pulse 55  Temp(Src) 98.3 F (36.8 C) (Oral)  Resp 22  Ht 5\' 2"  (1.575 m)  Wt 213 lb (96.616 kg)  BMI 38.95 kg/m2  SpO2 93%  BP Readings from Last 3 Encounters:  12/05/15 162/69  09/26/15 129/62  08/08/15 140/76   General- elderly female in no acute distress, obese Head-  atraumatic, normocephalic Neck- no cervical lymphadenopathy Throat- moist mucus membrane Cardiovascular- normal s1,s2, no murmurs, 1+ edema Respiratory- bilateral clear to auscultation, no wheeze, no rhonchi, no crackles, no use of accessory muscles Abdomen- bowel sounds present, soft, non tender Musculoskeletal- able to move all 4 extremities, self propels her wheelchair, kyphosis present Neurological- has some confusion Skin- warm and dry, chronic venous stasis changes   Labs reviewed:  CBC Latest Ref Rng 05/02/2015 10/01/2011 09/30/2011  WBC - 9.8 10.6(H) 12.4(H)  Hemoglobin 12.0 - 16.0 g/dL 11.4(A) 12.8 12.5  Hematocrit 36 - 46 % 36 36.8 36.3  Platelets 150 - 399 K/L 284 272 251   07/25/15 a1c 6.4 06/07/15 urine microalbuminuria positive 05/04/15 e.coli uti 05/02/15 wbc 9.8, hb 11.4, hct 35.6, plt 284, na 143, k 3.8, bun 20, cr 1.24, lft wnl, a1c 6.7 01/28/15 wbc 9.8, hb 12.1 01/22/15 wbc 11, hb 12.9, plt 314, ca 9.2, ast 17, alp 100, alt 23, a1c 6.4   Assessment/Plan  Fall initial encounter Has bruise to left forehead. No other apparent injury. Send u/a with c/s with her recent hx of UTI. Start macrobid 100 mg bid x 1 week. Hydration to be maintained. Send cbc with diff, cmp. Fall precautions  Altered mental state With confusion. Send infectious workup as above. Recent increase in zoloft to 100 mg daily for her mood was made yesterday. Monitor clinically  HTN Elevated bp, currently on lopressor 75 mg bid, losartan 100 mg daily, hydralazine 50 mg tid, lasix 60 mg bid and clonidine 0.3 mg bid. Increase hydralazine to 75 mg tid for now and monitor. Check bmp. Monitor bp bid for now  Depression and anxiety continue lamictal 50 mg daily with zoloft 100 mg daily for mood. Continue xanax 0.25 mg at noon and then q6h prn for anxiety.  CHF euvolemic overall, continue lopressor, losartan, lasix and hydralazine as above with kcl supple,ent.   Kaylee Serve, MD  [facility] Adult  Medicine [phone] (Monday-Friday 8 am - 5 pm) [phone] (afterhours)

## 2015-12-17 ENCOUNTER — Ambulatory Visit: Payer: Medicaid Other | Admitting: Cardiovascular Disease

## 2015-12-17 LAB — BASIC METABOLIC PANEL
BUN: 21 mg/dL (ref 4–21)
Creatinine: 1.7 mg/dL — AB (ref 0.5–1.1)
Glucose: 110 mg/dL
Potassium: 3.7 mmol/L (ref 3.4–5.3)
SODIUM: 142 mmol/L (ref 137–147)

## 2015-12-31 ENCOUNTER — Encounter: Payer: Self-pay | Admitting: Internal Medicine

## 2015-12-31 ENCOUNTER — Non-Acute Institutional Stay (SKILLED_NURSING_FACILITY): Payer: Medicare Other | Admitting: Internal Medicine

## 2015-12-31 DIAGNOSIS — E669 Obesity, unspecified: Secondary | ICD-10-CM

## 2015-12-31 DIAGNOSIS — E876 Hypokalemia: Secondary | ICD-10-CM

## 2015-12-31 DIAGNOSIS — R001 Bradycardia, unspecified: Secondary | ICD-10-CM | POA: Diagnosis not present

## 2015-12-31 DIAGNOSIS — E119 Type 2 diabetes mellitus without complications: Secondary | ICD-10-CM | POA: Diagnosis not present

## 2015-12-31 DIAGNOSIS — E1169 Type 2 diabetes mellitus with other specified complication: Secondary | ICD-10-CM

## 2015-12-31 NOTE — Progress Notes (Signed)
Patient ID: Kaylee Turner, female   DOB: Apr 22, 1931, 82 y.o.   MRN: [id number]      [facility], [employer] [facility]   Code Status: DNR  Chief Complaint  Patient presents with  . Medical Management of Chronic Issues    Routine Visit    Allergies  Allergen Reactions  . Levaquin [Levofloxacin In D5w]   . Penicillins   . Latex Rash    Causes blisters    Advanced Directives 12/05/2015  Does patient have an advance directive? Yes  Type of Advance Directive (No Data)  Copy of advanced directive(s) in chart? Yes  Pre-existing out of facility DNR order (yellow form or pink MOST form) -    HPI 82 y/o female patient is seen for routine visit. She has been at her baseline. Her blood pressure medication was adjusted due to bradycardia. She has been at her baseline. No new concerns from patient or nursing staff.  Review of Systems Constitutional: Negative for fever, chills   Respiratory: Negative for cough, shortness of breath and wheezing.    Cardiovascular: Negative for chest pain, palpitations Gastrointestinal: Negative for heartburn, nausea, vomiting, abdominal pain Genitourinary: Negative for dysuria   Musculoskeletal: Negative for falls Skin: Negative for itching Neurological: Negative for dizziness   Medication reviewed. See [facility]  Past medical history reviewed  Physical exam BP 112/77 mmHg  Temp(Src) 97.7 F (36.5 C) (Oral)  Resp 18  Ht 5\' 2"  (1.575 m)  Wt 191 lb 3.2 oz (86.728 kg)  BMI 34.96 kg/m2  SpO2 97%  BP Readings from Last 3 Encounters:  12/31/15 112/77  12/05/15 162/69  09/26/15 129/62   General- elderly female in no acute distress, obese Head- atraumatic, normocephalic Neck- no cervical lymphadenopathy Throat- moist mucus membrane Cardiovascular- normal s1,s2, no murmurs, 1+ edema Respiratory- bilateral clear to auscultation, no wheeze, no rhonchi, no crackles, no use of accessory muscles Abdomen- bowel sounds present, soft, non  tender Musculoskeletal- able to move all 4 extremities, self propels her wheelchair, kyphosis present Neurological- has some confusion Skin- warm and dry, chronic venous stasis changes   Labs reviewed:  CBC Latest Ref Rng 12/05/2015 05/02/2015 10/01/2011  WBC - 11.4 9.8 10.6(H)  Hemoglobin 12.0 - 16.0 g/dL 12.2 11.4(A) 12.8  Hematocrit 36 - 46 % 39 36 36.8  Platelets 150 - 399 K/L 313 284 272   CMP Latest Ref Rng 12/17/2015 10/28/2015 05/02/2015  Glucose 70 - 99 mg/dL - - -  BUN 4 - 21 mg/dL 21 25(A) 20  Creatinine 0.5 - 1.1 mg/dL 1.7(A) 1.2(A) 1.2(A)  Sodium 137 - 147 mmol/L 142 147 143  Potassium 3.4 - 5.3 mmol/L 3.7 3.7 3.8  Chloride 96 - 112 mEq/L - - -  CO2 19 - 32 mEq/L - - -  Calcium 8.4 - 10.5 mg/dL - - -  Total Protein 6.0 - 8.3 g/dL - - -  Total Bilirubin 0.3 - 1.2 mg/dL - - -  Alkaline Phos 25 - 125 U/L - - 101  AST 13 - 35 U/L - - 13  ALT 7 - 35 U/L - - 22   Lab Results  Component Value Date   HGBA1C 6.4 07/25/2015   06/07/15 urine microalbuminuria positive   Assessment/Plan  Bradycardia Heart rate in 60s today. Continue metoprolol 25 mg bid for now and monitor  Hypokalemia Stable, continue kcl supplement with her on lasix. Monitor bmp periodically  DM Lab Results  Component Value Date   HGBA1C 6.4 07/25/2015  diet  controlled, off all medication.     Blanchie Serve, MD  [facility] Adult Medicine [phone] (Monday-Friday 8 am - 5 pm) [phone] (afterhours)

## 2016-08-03 ENCOUNTER — Encounter (HOSPITAL_COMMUNITY): Payer: Self-pay

## 2016-08-03 ENCOUNTER — Emergency Department (HOSPITAL_COMMUNITY): Payer: Medicare Other

## 2016-08-03 ENCOUNTER — Emergency Department (HOSPITAL_COMMUNITY)
Admission: EM | Admit: 2016-08-03 | Discharge: 2016-08-03 | Disposition: A | Discharge status: Home / Self Care | Payer: Medicare Other | Attending: Emergency Medicine | Admitting: Emergency Medicine

## 2016-08-03 DIAGNOSIS — Y92129 Unspecified place in nursing home as the place of occurrence of the external cause: Secondary | ICD-10-CM | POA: Diagnosis not present

## 2016-08-03 DIAGNOSIS — Z79899 Other long term (current) drug therapy: Secondary | ICD-10-CM | POA: Diagnosis not present

## 2016-08-03 DIAGNOSIS — N189 Chronic kidney disease, unspecified: Secondary | ICD-10-CM | POA: Diagnosis not present

## 2016-08-03 DIAGNOSIS — Z87891 Personal history of nicotine dependence: Secondary | ICD-10-CM | POA: Insufficient documentation

## 2016-08-03 DIAGNOSIS — M5030 Other cervical disc degeneration, unspecified cervical region: Secondary | ICD-10-CM | POA: Diagnosis not present

## 2016-08-03 DIAGNOSIS — Z91040 Latex allergy status: Secondary | ICD-10-CM | POA: Insufficient documentation

## 2016-08-03 DIAGNOSIS — W050XXA Fall from non-moving wheelchair, initial encounter: Secondary | ICD-10-CM | POA: Insufficient documentation

## 2016-08-03 DIAGNOSIS — I5032 Chronic diastolic (congestive) heart failure: Secondary | ICD-10-CM | POA: Diagnosis not present

## 2016-08-03 DIAGNOSIS — R001 Bradycardia, unspecified: Secondary | ICD-10-CM | POA: Diagnosis not present

## 2016-08-03 DIAGNOSIS — N390 Urinary tract infection, site not specified: Secondary | ICD-10-CM | POA: Diagnosis not present

## 2016-08-03 DIAGNOSIS — S0083XA Contusion of other part of head, initial encounter: Secondary | ICD-10-CM | POA: Diagnosis not present

## 2016-08-03 DIAGNOSIS — Z853 Personal history of malignant neoplasm of breast: Secondary | ICD-10-CM | POA: Diagnosis not present

## 2016-08-03 DIAGNOSIS — Z7982 Long term (current) use of aspirin: Secondary | ICD-10-CM | POA: Diagnosis not present

## 2016-08-03 DIAGNOSIS — S0990XA Unspecified injury of head, initial encounter: Secondary | ICD-10-CM | POA: Insufficient documentation

## 2016-08-03 DIAGNOSIS — Y999 Unspecified external cause status: Secondary | ICD-10-CM | POA: Insufficient documentation

## 2016-08-03 DIAGNOSIS — I130 Hypertensive heart and chronic kidney disease with heart failure and stage 1 through stage 4 chronic kidney disease, or unspecified chronic kidney disease: Secondary | ICD-10-CM | POA: Insufficient documentation

## 2016-08-03 DIAGNOSIS — Y939 Activity, unspecified: Secondary | ICD-10-CM | POA: Insufficient documentation

## 2016-08-03 DIAGNOSIS — W19XXXA Unspecified fall, initial encounter: Secondary | ICD-10-CM

## 2016-08-03 DIAGNOSIS — E119 Type 2 diabetes mellitus without complications: Secondary | ICD-10-CM | POA: Insufficient documentation

## 2016-08-03 DIAGNOSIS — S0993XA Unspecified injury of face, initial encounter: Secondary | ICD-10-CM | POA: Diagnosis present

## 2016-08-03 LAB — BASIC METABOLIC PANEL
ANION GAP: 9 (ref 5–15)
BUN: 27 mg/dL — ABNORMAL HIGH (ref 6–20)
CHLORIDE: 102 mmol/L (ref 101–111)
CO2: 33 mmol/L — AB (ref 22–32)
Calcium: 9.3 mg/dL (ref 8.9–10.3)
Creatinine, Ser: 1.74 mg/dL — ABNORMAL HIGH (ref 0.44–1.00)
GFR calc non Af Amer: 26 mL/min — ABNORMAL LOW (ref >60)
GFR, EST AFRICAN AMERICAN: 30 mL/min — AB (ref >60)
Glucose, Bld: 106 mg/dL — ABNORMAL HIGH (ref 65–99)
Potassium: 3.4 mmol/L — ABNORMAL LOW (ref 3.5–5.1)
Sodium: 144 mmol/L (ref 135–145)

## 2016-08-03 LAB — URINE MICROSCOPIC-ADD ON: RBC / HPF: NONE SEEN RBC/hpf (ref 0–5)

## 2016-08-03 LAB — URINALYSIS, ROUTINE W REFLEX MICROSCOPIC
Bilirubin Urine: NEGATIVE
Glucose, UA: NEGATIVE mg/dL
Ketones, ur: NEGATIVE mg/dL
Nitrite: POSITIVE — AB
PH: 5.5 (ref 5.0–8.0)
Protein, ur: NEGATIVE mg/dL
SPECIFIC GRAVITY, URINE: 1.01 (ref 1.005–1.030)

## 2016-08-03 LAB — CBC WITH DIFFERENTIAL/PLATELET
BASOS ABS: 0 10*3/uL (ref 0.0–0.1)
BASOS PCT: 0 %
Eosinophils Absolute: 0.6 10*3/uL (ref 0.0–0.7)
Eosinophils Relative: 6 %
HEMATOCRIT: 37.2 % (ref 36.0–46.0)
HEMOGLOBIN: 11.3 g/dL — AB (ref 12.0–15.0)
Lymphocytes Relative: 24 %
Lymphs Abs: 2.5 10*3/uL (ref 0.7–4.0)
MCH: 28.3 pg (ref 26.0–34.0)
MCHC: 30.4 g/dL (ref 30.0–36.0)
MCV: 93 fL (ref 78.0–100.0)
MONOS PCT: 7 %
Monocytes Absolute: 0.7 10*3/uL (ref 0.1–1.0)
NEUTROS ABS: 6.6 10*3/uL (ref 1.7–7.7)
NEUTROS PCT: 63 %
Platelets: 271 10*3/uL (ref 150–400)
RBC: 4 MIL/uL (ref 3.87–5.11)
RDW: 14.8 % (ref 11.5–15.5)
WBC: 10.4 10*3/uL (ref 4.0–10.5)

## 2016-08-03 MED ORDER — CEPHALEXIN 250 MG PO CAPS: 250.0000 mg | ORAL_CAPSULE | Freq: Three times a day (TID) | ORAL | 0 refills | Status: DC

## 2016-08-03 MED ORDER — ACETAMINOPHEN 500 MG PO TABS
500.0000 mg | ORAL_TABLET | Freq: Once | ORAL | Status: AC
  Administered 2016-08-03: 500 mg via ORAL
  Filled 2016-08-03: qty 1

## 2016-08-03 NOTE — ED Notes (Signed)
Attempted report to [facility] facility x2. Unable to speak with RN.

## 2016-08-03 NOTE — ED Notes (Signed)
Pt was strongly about staying in her own clothes. Due to dementia, this NT and Martie Round, RN agreed that it was ok to leave pt in own clothes.

## 2016-08-03 NOTE — ED Triage Notes (Signed)
Pt. BIB EMS from [facility] for evaluation of hematoma to R forehead. Pt. Had mechanical fall out of wheelchair this AM. NO LOC. Pt. Has dementia at baseline. No blood thinners.

## 2016-08-03 NOTE — ED Notes (Signed)
Pt. Sister at bedside and updated on d/c plan and results.

## 2016-08-03 NOTE — Discharge Instructions (Signed)
Stay very well hydrated with plenty of water throughout the day. Take antibiotic until completed. Use tylenol or motrin as needed for pain. Use ice on your head to help with pain and swelling. Follow up with your primary care physician in 3-5 days for recheck of ongoing symptoms but return to ER for emergent changing or worsening of symptoms. Please seek immediate care if you develop the following: You develop back pain.  Your symptoms are no better, or worse in 3 days. There is severe back pain or lower abdominal pain.  You develop chills.  You have a fever.  There is nausea or vomiting.  There is continued burning or discomfort with urination.

## 2016-08-03 NOTE — ED Provider Notes (Signed)
[facility] DEPT Provider Note   CSN: [id number] Arrival date & time: 08/03/16  1050     History   Chief Complaint Chief Complaint  Patient presents with  . Fall    HPI Kaylee Turner is a 82 y.o. female with a PMHx of dementia, DM2 (diet controlled), recurrent UTIs, depression and anxiety, CHF, HTN, bradycardia, peripheral edema, remote breast cancer, CKD, PVD, fibromyalgia, and osteoporosis, who presents to the ED via EMS from [facility], with complaints of mechanical fall just prior to arrival. Patient states that she was getting out of her wheelchair and fell while in the living room, complains of a headache. Level V caveat due to dementia. I spoke with Anderson Malta at [facility] and states that the patient was getting out of her wheelchair and leaned too far forward causing the fall, states that she fell onto a laminate floor. Denies any blood thinner medications. States that she is at her mental baseline, and denies any recent signs or symptoms of illness including cough or urinary symptoms. Patient describes her headache is 5/10 aching constant nonradiating frontal headache with no known aggravating or alleviating factors given that she has not tried anything prior to arrival. She has a large hematoma on her fore head. She recalls the entire event, and denies LOC, which is corroborated by nursing staff at [facility]. Patient denies any neck or back pain, extremity pain, recent fevers or chills, cough, chest pain, shortness breath, abdominal pain, nausea, vomiting, diarrhea, constipation, dysuria, hematuria, numbness, tingling, focal weakness, lightheadedness, LOC, or vision changes. Denies any other complaints at this time.   The history is provided by the patient, the EMS personnel and medical records. The history is limited by the absence of a caregiver. No language interpreter was used.  Fall  This is a new problem. The current episode started less than 1  hour ago. The problem occurs rarely. The problem has not changed since onset.Associated symptoms include headaches. Pertinent negatives include no chest pain, no abdominal pain and no shortness of breath. Nothing aggravates the symptoms. Nothing relieves the symptoms. She has tried nothing for the symptoms. The treatment provided no relief.    Past Medical History:  Diagnosis Date  . Cancer ([place])   . Fibromyalgia   . Hypertension     Patient Active Problem List   Diagnosis Date Noted  . Diabetes mellitus type 2 in obese ([place]) 12/31/2015  . Recurrent UTI 09/26/2015  . Depression with anxiety 09/26/2015  . Chronic diastolic heart failure ([place]) 08/04/2015  . Hypertensive heart and renal disease with congestive heart failure ([employer]) 08/04/2015  . Diabetes mellitus, new onset ([place]) 06/04/2015  . Bilateral edema of lower extremity 06/04/2015  . Depression due to dementia 06/04/2015  . Hypertensive heart disease with heart failure ([place]) 02/11/2015  . Generalized anxiety disorder 02/11/2015  . Malignant neoplasm of female breast ([place]) 02/11/2015  . Dementia without behavioral disturbance 11/12/2014  . CHF, stage C ([place]) 11/12/2014  . Anxiety state 11/12/2014  . Essential hypertension 09/28/2014  . Chronic kidney disease (CKD) 09/28/2014  . PVD (peripheral vascular disease) ([place]) 09/28/2014  . Hypertensive heart failure ([place]) 09/28/2014  . Primary generalized (osteo)arthritis 09/28/2014  . Dementia, senile 09/28/2014  . Neoplasm of breast 09/28/2014  . Age-related nuclear cataract of left eye 09/28/2014  . Hyponatremia 10/01/2011  . Hypokalemia 10/01/2011  . Hypertension 10/01/2011    Past Surgical History:  Procedure Laterality Date  . BREAST SURGERY  RIGHT LUMPECTOMY  OB History    No data available       Home Medications    Prior to Admission medications   Medication Sig Start Date End Date Taking? Authorizing Provider  acetaminophen (TYLENOL) 500 MG tablet Take 1,000  mg by mouth 2 (two) times daily. DO NOT EXCEED 4 GM OF TYLENOL IN 24 HOURS    Historical Provider, MD  ALPRAZolam Duanne Moron) 0.25 MG tablet Take one tablet by mouth daily at 2pm for anxiety. Take one tablet by mouth every 8 hours as needed for anxiety. Do not exceed 4 doses in 24hrs.    Historical Provider, MD  aspirin EC 81 MG tablet Take 81 mg by mouth daily.      Historical Provider, MD  Calcium Carb-Cholecalciferol (OYSTER SHELL CALCIUM 500+D PO) Take by mouth daily.    Historical Provider, MD  Cholecalciferol (VITAMIN D-3) 1000 UNITS CAPS Take by mouth daily.    Historical Provider, MD  cloNIDine (CATAPRES) 0.3 MG tablet Take 0.3 mg by mouth 2 (two) times daily.    Historical Provider, MD  Cranberry 400 MG CAPS Reported on 12/31/2015    Historical Provider, MD  diltiazem (DILACOR XR) 240 MG 24 hr capsule Take 240 mg by mouth daily.    Historical Provider, MD  furosemide (LASIX) 20 MG tablet Take 60 mg by mouth 2 (two) times daily.     Historical Provider, MD  hydrALAZINE (APRESOLINE) 50 MG tablet Take one tablet by mouth three times daily    Historical Provider, MD  lamoTRIgine (LAMICTAL) 25 MG tablet Take 50 mg by mouth daily.    Historical Provider, MD  losartan (COZAAR) 100 MG tablet Take 100 mg by mouth daily.    Historical Provider, MD  Memantine HCl-Donepezil HCl (NAMZARIC) 28-10 MG CP24 Take by mouth daily.    Historical Provider, MD  metoprolol tartrate (LOPRESSOR) 25 MG tablet Take 75 mg by mouth 2 (two) times daily.    Historical Provider, MD  nystatin cream (MYCOSTATIN) Apply to rash under right breast twice daily after drying well    Historical Provider, MD  potassium chloride SA (K-DUR,KLOR-CON) 20 MEQ tablet Take 20 mEq by mouth 2 (two) times daily.    Historical Provider, MD  sennosides-docusate sodium (SENOKOT-S) 8.6-50 MG tablet Take 1 tablet by mouth at bedtime.    Historical Provider, MD  sertraline (ZOLOFT) 100 MG tablet Take one tablet by mouth once daily for depression     Historical Provider, MD  [place] Reported on 12/31/2015    Historical Provider, MD    Family History Family History  Problem Relation Age of Onset  . Crohn's disease Daughter   . COPD Daughter     Social History Social History  Substance Use Topics  . Smoking status: Former Research scientist (life sciences)  . Smokeless tobacco: Not on file  . Alcohol use No     Allergies   Levaquin [levofloxacin in d5w]; Penicillins; and Latex   Review of Systems Review of Systems  Unable to perform ROS: Dementia  Constitutional: Negative for fever.  Eyes: Negative for visual disturbance.  Respiratory: Negative for cough and shortness of breath.   Cardiovascular: Negative for chest pain.  Gastrointestinal: Negative for abdominal pain, constipation, diarrhea, nausea and vomiting.  Genitourinary: Negative for dysuria and hematuria.  Musculoskeletal: Negative for arthralgias, back pain, myalgias and neck pain.  Skin: Positive for color change (bruise R forehead). Negative for wound.  Allergic/Immunologic: Positive for immunocompromised state (diabetic (diet controlled)).  Neurological: Positive for headaches. Negative for  syncope, weakness, light-headedness and numbness.  Hematological: Does not bruise/bleed easily.  Psychiatric/Behavioral: Negative for confusion (at baseline per nursing staff).   Level 5 caveat due to dementia  Physical Exam Updated Vital Signs BP 143/61 (BP Location: Right Arm)   Pulse (!) 48   Temp 98.1 F (36.7 C) (Oral)   Resp 18   SpO2 95%   Physical Exam  Constitutional: Vital signs are normal. She appears well-developed and well-nourished.  Non-toxic appearance. No distress.  Afebrile, nontoxic, NAD  HENT:  Head: Normocephalic. Head is with contusion. Head is without raccoon's eyes, without Battle's sign, without abrasion and without laceration.  Mouth/Throat: Oropharynx is clear and moist and mucous membranes are normal.  Large hematoma contusion to R/central forehead, no  abrasions or lacerations, no scalp crepitus or deformity, no bony stepoffs, no raccoon eyes or battle's sign.   Eyes: Conjunctivae and EOM are normal. Pupils are equal, round, and reactive to light. Right eye exhibits no discharge. Left eye exhibits no discharge.  PERRL, EOMI, no nystagmus  Neck: No spinous process tenderness and no muscular tenderness present.  Towel wrapped around C-spine, no spinous process TTP, no bony stepoffs or deformities, no paraspinous muscle TTP or muscle spasms. No bruising or swelling. Did not initially assess ROM due to potential for injury  Cardiovascular: Regular rhythm, normal heart sounds and intact distal pulses.  Bradycardia present.  Exam reveals no gallop and no friction rub.   No murmur heard. Sinus bradycardia, reg rhythm, nl s1/s2, no m/r/g, distal pulses intact  Pulmonary/Chest: Effort normal and breath sounds normal. No respiratory distress. She has no decreased breath sounds. She has no wheezes. She has no rhonchi. She has no rales.  Abdominal: Soft. Normal appearance and bowel sounds are normal. She exhibits no distension. There is no tenderness. There is no rigidity, no rebound, no guarding, no CVA tenderness, no tenderness at McBurney's point and negative Murphy's sign.  Musculoskeletal: Normal range of motion.  No focal midline spinous process TTP, no stepoffs or deformities, no crepitus. No pelvic instability or tenderness with pelvic squeeze. All extremities without focal TTP, no bruising/abrasions, no swelling or evidence of injury to all extremities Strength and sensation grossly intact, distal pulses intact  Neurological: She is alert. She has normal strength. No cranial nerve deficit or sensory deficit. GCS eye subscore is 4. GCS verbal subscore is 5. GCS motor subscore is 6.  A&O x3 to person/place/situation, not to time (at baseline) GCS 15 with the caveat of disorientation to time which is baseline No focal neuro deficits on exam Strength and  sensation grossly intact No facial asymmetry, no tremors  Skin: Skin is warm, dry and intact. Bruising noted. No rash noted.  Contusion to R/central forehead as mentioned above, otherwise no abrasions/other bruising  Psychiatric: She has a normal mood and affect.  Nursing note and vitals reviewed.    ED Treatments / Results  Labs (all labs ordered are listed, but only abnormal results are displayed) Labs Reviewed  CBC WITH DIFFERENTIAL/PLATELET - Abnormal; Notable for the following:       Result Value   Hemoglobin 11.3 (*)    All other components within normal limits  BASIC METABOLIC PANEL - Abnormal; Notable for the following:    Potassium 3.4 (*)    CO2 33 (*)    Glucose, Bld 106 (*)    BUN 27 (*)    Creatinine, Ser 1.74 (*)    GFR calc non Af Amer 26 (*)    GFR  calc Af Amer 30 (*)    All other components within normal limits  URINALYSIS, ROUTINE W REFLEX MICROSCOPIC (NOT AT [facility]) - Abnormal; Notable for the following:    APPearance CLOUDY (*)    Hgb urine dipstick TRACE (*)    Nitrite POSITIVE (*)    Leukocytes, UA MODERATE (*)    All other components within normal limits  URINE MICROSCOPIC-ADD ON - Abnormal; Notable for the following:    Squamous Epithelial / LPF 6-30 (*)    Bacteria, UA MANY (*)    Casts HYALINE CASTS (*)    All other components within normal limits  URINE CULTURE    EKG  EKG Interpretation  Date/Time:  Monday August 03 2016 11:29:50 EDT Ventricular Rate:  49 PR Interval:    QRS Duration: 100 QT Interval:  484 QTC Calculation: 437 R Axis:   71 Text Interpretation:  Sinus bradycardia Minimal ST elevation, inferior leads No significant change since last tracing Confirmed by LITTLE MD, RACHEL [id number]) on 08/03/2016 11:42:43 AM Also confirmed by LITTLE MD, RACHEL [phone]), editor [place]  CCT, BEVERLY ([id number])  on 08/03/2016 12:04:52 PM       Radiology Ct Head Wo Contrast  Result Date: 08/03/2016 CLINICAL DATA:  [place] out of wheelchair this  morning, striking the head in the right side of the face with hematoma. Dementia up. EXAM: CT HEAD WITHOUT CONTRAST CT MAXILLOFACIAL WITHOUT CONTRAST CT CERVICAL SPINE WITHOUT CONTRAST TECHNIQUE: Multidetector CT imaging of the head, cervical spine, and maxillofacial structures were performed using the standard protocol without intravenous contrast. Multiplanar CT image reconstructions of the cervical spine and maxillofacial structures were also generated. COMPARISON:  MRI 10/03/2011.  CT 10/01/2011. FINDINGS: CT HEAD FINDINGS Brain: Generalized atrophy. Chronic small-vessel ischemic changes affecting the cerebral deep and subcortical white matter. No sign of acute infarction, mass lesion, hemorrhage, hydrocephalus or extra-axial collection. Vascular: There is atherosclerotic calcification of the major vessels at the base of the brain. Skull: No skull fracture. Sinuses/Orbits: Mucosal thickening affecting the right division of the sphenoid sinus. Other: Frontal scalp hematoma. CT MAXILLOFACIAL FINDINGS Osseous: No facial fracture. Orbits: No orbital fracture. No evidence of orbital soft tissue injury. Sinuses: Mucosal thickening affecting the right division of the sphenoid sinus. Other sinuses clear. Soft tissues: Right frontal region scalp hematoma. Limited intracranial: Negative CT CERVICAL SPINE FINDINGS Alignment: Normal Skull base and vertebrae: Negative. No vertebral body or posterior element fracture. Soft tissues and spinal canal: No soft tissue swelling. No evidence of significant canal compromise. Disc levels: Facet arthropathy most notable on the right at C7-T1 and on the left at C4-5. Degenerative spondylosis with endplate osteophytes. Foraminal encroachment on the right at C3-4, C4-5 and C5-6 and on the left at C4-5, C5-6 and C6-7. Upper chest: Emphysema.  No acute finding. Other: Benign goiter.  Carotid artery calcification. IMPRESSION: Head CT: No acute or traumatic finding. Chronic small-vessel  ischemic changes throughout the white matter. Facial CT: No facial fracture. Right forehead scalp hematoma. Inflammatory change with mucosal thickening affecting the right division of the sphenoid sinus. Cervical spine CT: No acute or traumatic finding. Ordinary degenerative changes as outlined above. Electronically Signed   By: Nelson Chimes M.D.   On: 08/03/2016 13:39   Ct Cervical Spine Wo Contrast  Result Date: 08/03/2016 CLINICAL DATA:  [place] out of wheelchair this morning, striking the head in the right side of the face with hematoma. Dementia up. EXAM: CT HEAD WITHOUT CONTRAST CT MAXILLOFACIAL WITHOUT CONTRAST CT CERVICAL SPINE WITHOUT CONTRAST TECHNIQUE:  Multidetector CT imaging of the head, cervical spine, and maxillofacial structures were performed using the standard protocol without intravenous contrast. Multiplanar CT image reconstructions of the cervical spine and maxillofacial structures were also generated. COMPARISON:  MRI 10/03/2011.  CT 10/01/2011. FINDINGS: CT HEAD FINDINGS Brain: Generalized atrophy. Chronic small-vessel ischemic changes affecting the cerebral deep and subcortical white matter. No sign of acute infarction, mass lesion, hemorrhage, hydrocephalus or extra-axial collection. Vascular: There is atherosclerotic calcification of the major vessels at the base of the brain. Skull: No skull fracture. Sinuses/Orbits: Mucosal thickening affecting the right division of the sphenoid sinus. Other: Frontal scalp hematoma. CT MAXILLOFACIAL FINDINGS Osseous: No facial fracture. Orbits: No orbital fracture. No evidence of orbital soft tissue injury. Sinuses: Mucosal thickening affecting the right division of the sphenoid sinus. Other sinuses clear. Soft tissues: Right frontal region scalp hematoma. Limited intracranial: Negative CT CERVICAL SPINE FINDINGS Alignment: Normal Skull base and vertebrae: Negative. No vertebral body or posterior element fracture. Soft tissues and spinal canal: No soft  tissue swelling. No evidence of significant canal compromise. Disc levels: Facet arthropathy most notable on the right at C7-T1 and on the left at C4-5. Degenerative spondylosis with endplate osteophytes. Foraminal encroachment on the right at C3-4, C4-5 and C5-6 and on the left at C4-5, C5-6 and C6-7. Upper chest: Emphysema.  No acute finding. Other: Benign goiter.  Carotid artery calcification. IMPRESSION: Head CT: No acute or traumatic finding. Chronic small-vessel ischemic changes throughout the white matter. Facial CT: No facial fracture. Right forehead scalp hematoma. Inflammatory change with mucosal thickening affecting the right division of the sphenoid sinus. Cervical spine CT: No acute or traumatic finding. Ordinary degenerative changes as outlined above. Electronically Signed   By: Nelson Chimes M.D.   On: 08/03/2016 13:39   Ct Maxillofacial Wo Contrast  Result Date: 08/03/2016 CLINICAL DATA:  [place] out of wheelchair this morning, striking the head in the right side of the face with hematoma. Dementia up. EXAM: CT HEAD WITHOUT CONTRAST CT MAXILLOFACIAL WITHOUT CONTRAST CT CERVICAL SPINE WITHOUT CONTRAST TECHNIQUE: Multidetector CT imaging of the head, cervical spine, and maxillofacial structures were performed using the standard protocol without intravenous contrast. Multiplanar CT image reconstructions of the cervical spine and maxillofacial structures were also generated. COMPARISON:  MRI 10/03/2011.  CT 10/01/2011. FINDINGS: CT HEAD FINDINGS Brain: Generalized atrophy. Chronic small-vessel ischemic changes affecting the cerebral deep and subcortical white matter. No sign of acute infarction, mass lesion, hemorrhage, hydrocephalus or extra-axial collection. Vascular: There is atherosclerotic calcification of the major vessels at the base of the brain. Skull: No skull fracture. Sinuses/Orbits: Mucosal thickening affecting the right division of the sphenoid sinus. Other: Frontal scalp hematoma. CT  MAXILLOFACIAL FINDINGS Osseous: No facial fracture. Orbits: No orbital fracture. No evidence of orbital soft tissue injury. Sinuses: Mucosal thickening affecting the right division of the sphenoid sinus. Other sinuses clear. Soft tissues: Right frontal region scalp hematoma. Limited intracranial: Negative CT CERVICAL SPINE FINDINGS Alignment: Normal Skull base and vertebrae: Negative. No vertebral body or posterior element fracture. Soft tissues and spinal canal: No soft tissue swelling. No evidence of significant canal compromise. Disc levels: Facet arthropathy most notable on the right at C7-T1 and on the left at C4-5. Degenerative spondylosis with endplate osteophytes. Foraminal encroachment on the right at C3-4, C4-5 and C5-6 and on the left at C4-5, C5-6 and C6-7. Upper chest: Emphysema.  No acute finding. Other: Benign goiter.  Carotid artery calcification. IMPRESSION: Head CT: No acute or traumatic finding. Chronic small-vessel ischemic changes  throughout the white matter. Facial CT: No facial fracture. Right forehead scalp hematoma. Inflammatory change with mucosal thickening affecting the right division of the sphenoid sinus. Cervical spine CT: No acute or traumatic finding. Ordinary degenerative changes as outlined above. Electronically Signed   By: Nelson Chimes M.D.   On: 08/03/2016 13:39    Procedures Procedures (including critical care time)  Medications Ordered in ED Medications  acetaminophen (TYLENOL) tablet 500 mg (500 mg Oral Given 08/03/16 1129)     Initial Impression / Assessment and Plan / ED Course  I have reviewed the triage vital signs and the nursing notes.  Pertinent labs & imaging results that were available during my care of the patient were reviewed by me and considered in my medical decision making (see chart for details).  Clinical Course    82 y.o. female here after mechanical fall from wheelchair onto laminate flooring at nursing home, no LOC, no blood thinners, at  baseline per nursing home staff. C/o HA. Large hematoma over R/central forehead. PERRL, EOMI, A&O x3 (situation, person, and place but not time, which is normal for her per nursing staff). Recalls the fall. Nursing home denies any recent symptoms of infection or illness, but given fall, will get basic labs, EKG, and U/A in addition to CT head/neck/maxillofacial. Will give tylenol for headache. No extremity pain/tenderness/deformities, no pelvic instability, doubt need for other imaging elsewhere. Will reassess after labs/imaging. Discussed case with my attending Dr. Rex Kras who agrees with plan.   2:21 PM CBC w/diff unremarkable aside from Hgb 11.3 which is similar to prior. BMP with minimally elevated BUN/Cr similar to baseline. U/A with +nitrite, +leuks, 6-30 squamous but TNTC WBC and many bacteria, could be just a contaminant, but will treat for UTI given her fall today-- CrCl borderline so will use keflex 250mg  TID x5 days for UTI treatment. Will also send for culture. EKG with sinus bradycardia but no acute changes/ischemic findings. CT head/neck/maxillofacial unremarkable for acute findings aside from hematoma of forehead and some chronic degenerative changes of cervical spine.  Pt feeling well, ready for d/c back to [facility], no changes in mentation since previous exam, stable and ready for d/c. Discussed f/up with PCP in 3-5 days for recheck, and staying hydrated-- also placed this information in the discharge summary so nursing staff at nursing home will also be aware. Tylenol/motrin for pain, ice to head for contusion. I explained the diagnosis and have given explicit precautions to return to the ER including for any other new or worsening symptoms. The patient reports that she understands and accepts the medical plan as it's been dictated and I have answered their questions. Discharge instructions concerning home care and prescriptions have been given. The patient is STABLE and is  discharged to nursing home in good condition.   Final Clinical Impressions(s) / ED Diagnoses   Final diagnoses:  Fall, initial encounter  Head injury, initial encounter  Facial contusion, initial encounter  Bradycardia  UTI (lower urinary tract infection)  DDD (degenerative disc disease), cervical  CKD (chronic kidney disease), unspecified stage    New Prescriptions New Prescriptions   CEPHALEXIN (KEFLEX) 250 MG CAPSULE    Take 1 capsule (250 mg total) by mouth 3 (three) times daily. x5  days     Zacarias Pontes, PA-C 08/03/16 [place], MD 08/05/16 [phone]

## 2016-08-05 LAB — URINE CULTURE: Culture: >=100000 — AB

## 2016-08-06 ENCOUNTER — Telehealth (HOSPITAL_COMMUNITY): Payer: Self-pay

## 2016-08-06 NOTE — Telephone Encounter (Signed)
Post ED Visit - Positive Culture Follow-up  Culture report reviewed by antimicrobial stewardship pharmacist:  []  Elenor Quinones, Pharm.D. []  Heide Guile, Pharm.D., BCPS []  Parks Neptune, Pharm.D. []  Alycia Rossetti, Pharm.D., BCPS []  [place], Pharm.D., BCPS, AAHIVP []  Legrand Como, Pharm.D., BCPS, AAHIVP []  Milus Glazier, Pharm.D. []  Stephens November, Pharm.D. Shanon Rosser, Pharm.D.  Positive urine culture, >/= 100,000 colonies -> E Coli Treated with Cephalexin, organism sensitive to the same and no further patient follow-up is required at this time.   Kaylee Turner 08/06/2016, 4:45 PM

## 2016-08-17 ENCOUNTER — Emergency Department (HOSPITAL_COMMUNITY)
Admission: EM | Admit: 2016-08-17 | Discharge: 2016-08-17 | Disposition: A | Discharge status: Home / Self Care | Payer: Medicare Other | Attending: Emergency Medicine | Admitting: Emergency Medicine

## 2016-08-17 ENCOUNTER — Encounter (HOSPITAL_COMMUNITY): Payer: Self-pay | Admitting: Emergency Medicine

## 2016-08-17 DIAGNOSIS — Z79899 Other long term (current) drug therapy: Secondary | ICD-10-CM | POA: Insufficient documentation

## 2016-08-17 DIAGNOSIS — I5032 Chronic diastolic (congestive) heart failure: Secondary | ICD-10-CM | POA: Insufficient documentation

## 2016-08-17 DIAGNOSIS — Z859 Personal history of malignant neoplasm, unspecified: Secondary | ICD-10-CM | POA: Diagnosis not present

## 2016-08-17 DIAGNOSIS — I130 Hypertensive heart and chronic kidney disease with heart failure and stage 1 through stage 4 chronic kidney disease, or unspecified chronic kidney disease: Secondary | ICD-10-CM | POA: Diagnosis not present

## 2016-08-17 DIAGNOSIS — Z043 Encounter for examination and observation following other accident: Secondary | ICD-10-CM | POA: Diagnosis present

## 2016-08-17 DIAGNOSIS — Z87891 Personal history of nicotine dependence: Secondary | ICD-10-CM | POA: Diagnosis not present

## 2016-08-17 DIAGNOSIS — W19XXXA Unspecified fall, initial encounter: Secondary | ICD-10-CM

## 2016-08-17 DIAGNOSIS — N189 Chronic kidney disease, unspecified: Secondary | ICD-10-CM | POA: Diagnosis not present

## 2016-08-17 DIAGNOSIS — Z853 Personal history of malignant neoplasm of breast: Secondary | ICD-10-CM | POA: Diagnosis not present

## 2016-08-17 HISTORY — DX: Hyperaldosteronism, unspecified: E26.9

## 2016-08-17 HISTORY — DX: Hypokalemia: E87.6

## 2016-08-17 HISTORY — DX: Disorder of kidney and ureter, unspecified: N28.9

## 2016-08-17 HISTORY — DX: Unspecified dementia, unspecified severity, without behavioral disturbance, psychotic disturbance, mood disturbance, and anxiety: F03.90

## 2016-08-17 HISTORY — DX: Vitamin D deficiency, unspecified: E55.9

## 2016-08-17 NOTE — ED Triage Notes (Signed)
Pt comes from [facility] via EMS with complaints of an unwitnessed fall.  Found on the floor in a sitting position with no obvious new injuries.  Pt has no complaints of pain.  Hx of severe dementia. DNR at bedside.  Hypertensive in route 202/82. EMS had to put 2L on in route due to dropping to 89% but is now weaned off onto room air.

## 2016-08-17 NOTE — ED Notes (Signed)
Bed: WA15 Expected date:  Expected time:  Means of arrival:  Comments: EMS  

## 2016-08-17 NOTE — ED Provider Notes (Signed)
[place] DEPT Provider Note   CSN: [id number] Arrival date & time: 08/17/16  0032  By signing my name below, I, Kaylee Turner, attest that this documentation has been prepared under the direction and in the presence of [name], MD. Electronically Signed: [name], Education administrator. 08/17/16. 12:49 AM.  History   Chief Complaint Chief Complaint  Patient presents with  . Fall    The history is provided by the patient. The history is limited by the condition of the patient. No language interpreter was used.      LEVEL 5 CAVEAT: Dementia HPI Comments: Kaylee Turner is a 82 y.o. female brought in by ambulance from [facility], who presents to the Emergency Department tonight status post unwitnessed fall.  Per nursing note, patient was found on the floor in a sitting position. She denies any complaints at this time.   Past Medical History:  Diagnosis Date  . Cancer ([place])   . Dementia   . Fibromyalgia   . Hyperaldosteronism ([place])   . Hypertension   . Hypokalemia   . Renal disorder    CKD  . Vitamin D deficiency     Patient Active Problem List   Diagnosis Date Noted  . Diabetes mellitus type 2 in obese ([place]) 12/31/2015  . Recurrent UTI 09/26/2015  . Depression with anxiety 09/26/2015  . Chronic diastolic heart failure ([place]) 08/04/2015  . Hypertensive heart and renal disease with congestive heart failure ([place]) 08/04/2015  . Diabetes mellitus, new onset ([place]) 06/04/2015  . Bilateral edema of lower extremity 06/04/2015  . Depression due to dementia 06/04/2015  . Hypertensive heart disease with heart failure ([place]) 02/11/2015  . Generalized anxiety disorder 02/11/2015  . Malignant neoplasm of female breast ([place]) 02/11/2015  . Dementia without behavioral disturbance 11/12/2014  . CHF, stage C ([place]) 11/12/2014  . Anxiety state 11/12/2014  . Essential hypertension 09/28/2014  . Chronic kidney disease (CKD) 09/28/2014  . PVD (peripheral vascular disease) ([place])  09/28/2014  . Hypertensive heart failure ([place]) 09/28/2014  . Primary generalized (osteo)arthritis 09/28/2014  . Dementia, senile 09/28/2014  . Neoplasm of breast 09/28/2014  . Age-related nuclear cataract of left eye 09/28/2014  . Hyponatremia 10/01/2011  . Hypokalemia 10/01/2011  . Hypertension 10/01/2011    Past Surgical History:  Procedure Laterality Date  . BREAST SURGERY  RIGHT LUMPECTOMY    OB History    No data available       Home Medications    Prior to Admission medications   Medication Sig Start Date End Date Taking? Authorizing Provider  acetaminophen (TYLENOL) 500 MG tablet Take 1,000 mg by mouth 2 (two) times daily. DO NOT EXCEED 4 GM OF TYLENOL IN 24 HOURS    Historical Provider, MD  ALPRAZolam Duanne Moron) 0.25 MG tablet Take one tablet by mouth daily at 2pm for anxiety. Take one tablet by mouth every 8 hours as needed for anxiety. Do not exceed 4 doses in 24hrs.    Historical Provider, MD  aspirin EC 81 MG tablet Take 81 mg by mouth daily.      Historical Provider, MD  Calcium Carb-Cholecalciferol (OYSTER SHELL CALCIUM 500+D PO) Take by mouth daily.    Historical Provider, MD  cephALEXin (KEFLEX) 250 MG capsule Take 1 capsule (250 mg total) by mouth 3 (three) times daily. x5  days 08/03/16   Mercedes Camprubi-Soms, PA-C  Cholecalciferol (VITAMIN D-3) 1000 UNITS CAPS Take by mouth daily.    Historical Provider, MD  cloNIDine (CATAPRES) 0.3 MG tablet Take 0.3 mg  by mouth 2 (two) times daily.    Historical Provider, MD  Cranberry 400 MG CAPS Reported on 12/31/2015    Historical Provider, MD  diltiazem (DILACOR XR) 240 MG 24 hr capsule Take 240 mg by mouth daily.    Historical Provider, MD  furosemide (LASIX) 20 MG tablet Take 60 mg by mouth 2 (two) times daily.     Historical Provider, MD  hydrALAZINE (APRESOLINE) 50 MG tablet Take one tablet by mouth three times daily    Historical Provider, MD  lamoTRIgine (LAMICTAL) 25 MG tablet Take 50 mg by mouth daily.    Historical  Provider, MD  losartan (COZAAR) 100 MG tablet Take 100 mg by mouth daily.    Historical Provider, MD  Memantine HCl-Donepezil HCl (NAMZARIC) 28-10 MG CP24 Take by mouth daily.    Historical Provider, MD  metoprolol tartrate (LOPRESSOR) 25 MG tablet Take 75 mg by mouth 2 (two) times daily.    Historical Provider, MD  nystatin cream (MYCOSTATIN) Apply to rash under right breast twice daily after drying well    Historical Provider, MD  potassium chloride SA (K-DUR,KLOR-CON) 20 MEQ tablet Take 20 mEq by mouth 2 (two) times daily.    Historical Provider, MD  sennosides-docusate sodium (SENOKOT-S) 8.6-50 MG tablet Take 1 tablet by mouth at bedtime.    Historical Provider, MD  sertraline (ZOLOFT) 100 MG tablet Take one tablet by mouth once daily for depression    Historical Provider, MD  [place] Reported on 12/31/2015    Historical Provider, MD    Family History Family History  Problem Relation Age of Onset  . Crohn's disease Daughter   . COPD Daughter     Social History Social History  Substance Use Topics  . Smoking status: Former Research scientist (life sciences)  . Smokeless tobacco: Not on file  . Alcohol use No     Allergies   Levaquin [levofloxacin in d5w]; Penicillins; and Latex   Review of Systems Review of Systems  Unable to perform ROS: Dementia     Physical Exam Updated Vital Signs BP 188/69   Pulse (!) 57   Temp 98.3 F (36.8 C) (Oral)   Resp 20   SpO2 (!) 88%   Physical Exam  CONSTITUTIONAL: elderly, awake/alert, interactive, no distress HEAD: Normocephalic, healing contusion to right forehead (old per charting) EYES: EOMI ENMT: Mucous membranes moist NECK: supple no meningeal signs SPINE/BACK:entire spine nontender CV: S1/S2 noted, no murmurs/rubs/gallops noted LUNGS: Lungs are clear to auscultation bilaterally, no apparent distress ABDOMEN: soft, nontender, no rebound or guarding, bowel sounds noted throughout abdomen GU:no cva tenderness NEURO: Pt is awake/alert. Patient  pleasantly confused, moves all extremitiesx4.  No facial droop.  Pt is able to ambulate with assistance EXTREMITIES: pulses normal/equal, full ROM. All extremities/joints palpated/ranged and nontender SKIN: warm, color normal  ED Treatments / Results  DIAGNOSTIC STUDIES: Oxygen Saturation is 88% on RA, low by my interpretation.    COORDINATION OF CARE:    Labs (all labs ordered are listed, but only abnormal results are displayed) Labs Reviewed - No data to display  EKG  EKG Interpretation None       Radiology No results found.  Procedures Procedures (including critical care time)  Medications Ordered in ED Medications - No data to display   Initial Impression / Assessment and Plan / ED Course  I have reviewed the triage vital signs and the nursing notes.   Clinical Course    D/w Harrold Donath from [facility] She reports patient had  unwitnessed fall No change in mental status No obvious signs of trauma (bruise to forehead is not new) Pt at baseline Will d/c She did have mild hypoxia that corrected Lungs clear, no chest wall injury noted   Final Clinical Impressions(s) / ED Diagnoses   Final diagnoses:  Fall, initial encounter    New Prescriptions New Prescriptions   No medications on file   I personally performed the services described in this documentation, which was scribed in my presence. The recorded information has been reviewed and is accurate.        [name], MD 08/17/16 [phone]

## 2016-08-17 NOTE — Discharge Instructions (Signed)

## 2016-08-17 NOTE — ED Notes (Signed)
PTAR requested for transport 

## 2016-08-19 ENCOUNTER — Encounter (HOSPITAL_COMMUNITY): Payer: Self-pay | Admitting: Emergency Medicine

## 2016-08-19 ENCOUNTER — Inpatient Hospital Stay (HOSPITAL_COMMUNITY)
Admission: EM | Admit: 2016-08-19 | Discharge: 2016-08-26 | DRG: 206 | Disposition: A | Discharge status: Nursing Home (Includes ICF) | Payer: Medicare Other | Attending: Internal Medicine | Admitting: Internal Medicine

## 2016-08-19 DIAGNOSIS — F0280 Dementia in other diseases classified elsewhere without behavioral disturbance: Secondary | ICD-10-CM | POA: Diagnosis present

## 2016-08-19 DIAGNOSIS — R0902 Hypoxemia: Principal | ICD-10-CM | POA: Diagnosis present

## 2016-08-19 DIAGNOSIS — R9431 Abnormal electrocardiogram [ECG] [EKG]: Secondary | ICD-10-CM | POA: Diagnosis present

## 2016-08-19 DIAGNOSIS — E860 Dehydration: Secondary | ICD-10-CM | POA: Diagnosis present

## 2016-08-19 DIAGNOSIS — Z79899 Other long term (current) drug therapy: Secondary | ICD-10-CM

## 2016-08-19 DIAGNOSIS — R112 Nausea with vomiting, unspecified: Secondary | ICD-10-CM | POA: Diagnosis not present

## 2016-08-19 DIAGNOSIS — M150 Primary generalized (osteo)arthritis: Secondary | ICD-10-CM | POA: Diagnosis present

## 2016-08-19 DIAGNOSIS — L8990 Pressure ulcer of unspecified site, unspecified stage: Secondary | ICD-10-CM | POA: Insufficient documentation

## 2016-08-19 DIAGNOSIS — Z880 Allergy status to penicillin: Secondary | ICD-10-CM

## 2016-08-19 DIAGNOSIS — E669 Obesity, unspecified: Secondary | ICD-10-CM | POA: Diagnosis present

## 2016-08-19 DIAGNOSIS — I5032 Chronic diastolic (congestive) heart failure: Secondary | ICD-10-CM | POA: Diagnosis present

## 2016-08-19 DIAGNOSIS — E269 Hyperaldosteronism, unspecified: Secondary | ICD-10-CM | POA: Diagnosis present

## 2016-08-19 DIAGNOSIS — I739 Peripheral vascular disease, unspecified: Secondary | ICD-10-CM | POA: Diagnosis present

## 2016-08-19 DIAGNOSIS — D72829 Elevated white blood cell count, unspecified: Secondary | ICD-10-CM | POA: Diagnosis present

## 2016-08-19 DIAGNOSIS — W19XXXA Unspecified fall, initial encounter: Secondary | ICD-10-CM | POA: Diagnosis present

## 2016-08-19 DIAGNOSIS — I130 Hypertensive heart and chronic kidney disease with heart failure and stage 1 through stage 4 chronic kidney disease, or unspecified chronic kidney disease: Secondary | ICD-10-CM | POA: Diagnosis present

## 2016-08-19 DIAGNOSIS — M797 Fibromyalgia: Secondary | ICD-10-CM | POA: Diagnosis present

## 2016-08-19 DIAGNOSIS — Z91040 Latex allergy status: Secondary | ICD-10-CM

## 2016-08-19 DIAGNOSIS — J439 Emphysema, unspecified: Secondary | ICD-10-CM | POA: Diagnosis present

## 2016-08-19 DIAGNOSIS — Y92099 Unspecified place in other non-institutional residence as the place of occurrence of the external cause: Secondary | ICD-10-CM

## 2016-08-19 DIAGNOSIS — R509 Fever, unspecified: Secondary | ICD-10-CM

## 2016-08-19 DIAGNOSIS — G309 Alzheimer's disease, unspecified: Secondary | ICD-10-CM | POA: Diagnosis present

## 2016-08-19 DIAGNOSIS — I160 Hypertensive urgency: Secondary | ICD-10-CM | POA: Diagnosis present

## 2016-08-19 DIAGNOSIS — E1122 Type 2 diabetes mellitus with diabetic chronic kidney disease: Secondary | ICD-10-CM | POA: Diagnosis present

## 2016-08-19 DIAGNOSIS — E876 Hypokalemia: Secondary | ICD-10-CM | POA: Diagnosis present

## 2016-08-19 DIAGNOSIS — J449 Chronic obstructive pulmonary disease, unspecified: Secondary | ICD-10-CM | POA: Diagnosis present

## 2016-08-19 DIAGNOSIS — I10 Essential (primary) hypertension: Secondary | ICD-10-CM | POA: Diagnosis present

## 2016-08-19 DIAGNOSIS — F419 Anxiety disorder, unspecified: Secondary | ICD-10-CM | POA: Diagnosis present

## 2016-08-19 DIAGNOSIS — Z87891 Personal history of nicotine dependence: Secondary | ICD-10-CM

## 2016-08-19 DIAGNOSIS — R296 Repeated falls: Secondary | ICD-10-CM

## 2016-08-19 DIAGNOSIS — Z881 Allergy status to other antibiotic agents status: Secondary | ICD-10-CM

## 2016-08-19 DIAGNOSIS — I4581 Long QT syndrome: Secondary | ICD-10-CM | POA: Diagnosis present

## 2016-08-19 DIAGNOSIS — I110 Hypertensive heart disease with heart failure: Secondary | ICD-10-CM | POA: Diagnosis present

## 2016-08-19 DIAGNOSIS — N183 Chronic kidney disease, stage 3 (moderate): Secondary | ICD-10-CM | POA: Diagnosis present

## 2016-08-19 DIAGNOSIS — L309 Dermatitis, unspecified: Secondary | ICD-10-CM | POA: Diagnosis present

## 2016-08-19 DIAGNOSIS — R262 Difficulty in walking, not elsewhere classified: Secondary | ICD-10-CM

## 2016-08-19 DIAGNOSIS — S61210A Laceration without foreign body of right index finger without damage to nail, initial encounter: Secondary | ICD-10-CM | POA: Diagnosis present

## 2016-08-19 DIAGNOSIS — E119 Type 2 diabetes mellitus without complications: Secondary | ICD-10-CM

## 2016-08-19 DIAGNOSIS — R600 Localized edema: Secondary | ICD-10-CM | POA: Diagnosis present

## 2016-08-19 DIAGNOSIS — N184 Chronic kidney disease, stage 4 (severe): Secondary | ICD-10-CM | POA: Diagnosis present

## 2016-08-19 DIAGNOSIS — E559 Vitamin D deficiency, unspecified: Secondary | ICD-10-CM | POA: Diagnosis present

## 2016-08-19 DIAGNOSIS — Z6831 Body mass index (BMI) 31.0-31.9, adult: Secondary | ICD-10-CM

## 2016-08-19 DIAGNOSIS — I959 Hypotension, unspecified: Secondary | ICD-10-CM | POA: Diagnosis present

## 2016-08-19 DIAGNOSIS — F0390 Unspecified dementia without behavioral disturbance: Secondary | ICD-10-CM | POA: Diagnosis present

## 2016-08-19 DIAGNOSIS — Z825 Family history of asthma and other chronic lower respiratory diseases: Secondary | ICD-10-CM

## 2016-08-19 NOTE — ED Provider Notes (Signed)
[place] [facility] DEPT Provider Note   CSN: [id number] Arrival date & time: 08/19/16  2313  By signing my name below, I, Higinio Plan, attest that this documentation has been prepared under the direction and in the presence of Blanchie Dessert, MD . Electronically Signed: Higinio Plan, Scribe. 08/19/2016. 11:37 PM.  History   Chief Complaint Chief Complaint  Patient presents with  . Fall    unwitnessed    The history is provided by the patient. No language interpreter was used.   LEVEL 5 CAVEAT: HPI and ROS limited due to Dementia  HPI Comments: Kaylee Turner is a 82 y.o. female with PMHx of dementia, fibromyalgia and HTN, who presents to the Emergency Department from [facility], for an evaluation s/p an unwitnessed fall that occurred this evening. Pt has a bump to her central forehead and laceration on her right index finger. She denies pain to any area of her body. She denies shortness of breath and any other complaints.   Past Medical History:  Diagnosis Date  . Cancer ([place])   . Dementia   . Fibromyalgia   . Hyperaldosteronism ([place])   . Hypertension   . Hypokalemia   . Renal disorder    CKD  . Vitamin D deficiency     Patient Active Problem List   Diagnosis Date Noted  . Diabetes mellitus type 2 in obese ([place]) 12/31/2015  . Recurrent UTI 09/26/2015  . Depression with anxiety 09/26/2015  . Chronic diastolic heart failure ([place]) 08/04/2015  . 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 Diabetes mellitus, new onset ([place]) 06/04/2015  . Bilateral edema of lower extremity 06/04/2015  . Depression due to dementia 06/04/2015  . Hypertensive heart disease with heart failure ([place]) 02/11/2015  . Generalized anxiety disorder 02/11/2015  . Malignant neoplasm of female breast ([place]) 02/11/2015  . Dementia without behavioral disturbance 11/12/2014  . CHF, stage C ([place]) 11/12/2014  . Anxiety state 11/12/2014  . Essential  hypertension 09/28/2014  . Chronic kidney disease (CKD) 09/28/2014  . PVD (peripheral vascular disease) ([place]) 09/28/2014  . Hypertensive heart failure ([place]) 09/28/2014  . Primary generalized (osteo)arthritis 09/28/2014  . Dementia, senile 09/28/2014  . Neoplasm of breast 09/28/2014  . Age-related nuclear cataract of left eye 09/28/2014  . Hyponatremia 10/01/2011  . Hypokalemia 10/01/2011  . Hypertension 10/01/2011    Past Surgical History:  Procedure Laterality Date  . BREAST SURGERY  RIGHT LUMPECTOMY    OB History    No data available     Home Medications    Prior to Admission medications   Medication Sig Start Date End Date Taking? Authorizing Provider  acetaminophen (TYLENOL) 500 MG tablet Take 1,000 mg by mouth 2 (two) times daily. DO NOT EXCEED 4 GM OF TYLENOL IN 24 HOURS    Historical Provider, MD  ALPRAZolam Duanne Moron) 0.25 MG tablet Take one tablet by mouth daily at 2pm for anxiety. Take one tablet by mouth every 8 hours as needed for anxiety. Do not exceed 4 doses in 24hrs.    Historical Provider, MD  aspirin EC 81 MG tablet Take 81 mg by mouth daily.      Historical Provider, MD  Calcium Carb-Cholecalciferol (OYSTER SHELL CALCIUM 500+D PO) Take by mouth daily.    Historical Provider, MD  cephALEXin (KEFLEX) 250 MG capsule Take 1 capsule (250 mg total) by mouth 3 (three) times daily. x5  days 08/03/16   Mercedes Camprubi-Soms, PA-C  Cholecalciferol (VITAMIN D-3) 1000 UNITS CAPS  Take by mouth daily.    Historical Provider, MD  cloNIDine (CATAPRES) 0.3 MG tablet Take 0.3 mg by mouth 2 (two) times daily.    Historical Provider, MD  Cranberry 400 MG CAPS Reported on 12/31/2015    Historical Provider, MD  diltiazem (DILACOR XR) 240 MG 24 hr capsule Take 240 mg by mouth daily.    Historical Provider, MD  furosemide (LASIX) 20 MG tablet Take 60 mg by mouth 2 (two) times daily.     Historical Provider, MD  hydrALAZINE (APRESOLINE) 50 MG tablet Take one tablet by mouth three times daily     Historical Provider, MD  lamoTRIgine (LAMICTAL) 25 MG tablet Take 50 mg by mouth daily.    Historical Provider, MD  losartan (COZAAR) 100 MG tablet Take 100 mg by mouth daily.    Historical Provider, MD  Memantine HCl-Donepezil HCl (NAMZARIC) 28-10 MG CP24 Take by mouth daily.    Historical Provider, MD  metoprolol tartrate (LOPRESSOR) 25 MG tablet Take 75 mg by mouth 2 (two) times daily.    Historical Provider, MD  nystatin cream (MYCOSTATIN) Apply to rash under right breast twice daily after drying well    Historical Provider, MD  potassium chloride SA (K-DUR,KLOR-CON) 20 MEQ tablet Take 20 mEq by mouth 2 (two) times daily.    Historical Provider, MD  sennosides-docusate sodium (SENOKOT-S) 8.6-50 MG tablet Take 1 tablet by mouth at bedtime.    Historical Provider, MD  sertraline (ZOLOFT) 100 MG tablet Take one tablet by mouth once daily for depression    Historical Provider, MD  [place] Reported on 12/31/2015    Historical Provider, MD    Family History Family History  Problem Relation Age of Onset  . Crohn's disease Daughter   . COPD Daughter     Social History Social History  Substance Use Topics  . Smoking status: Former Research scientist (life sciences)  . Smokeless tobacco: Not on file  . Alcohol use No     Allergies   Levaquin [levofloxacin in d5w]; Penicillins; and Latex   Review of Systems Review of Systems  Unable to perform ROS: Dementia   Physical Exam Updated Vital Signs There were no vitals taken for this visit.  Physical Exam  Constitutional: She appears well-developed and well-nourished. No distress.  HENT:  Head: Normocephalic.  Contusion over forehead with healing ecchymosis.  Eyes: EOM are normal.  Neck: Normal range of motion.  C-soine is normal  Cardiovascular: Normal rate, regular rhythm and normal heart sounds.   Pulmonary/Chest: Effort normal and breath sounds normal.  Abdominal: Soft. She exhibits no distension. There is no tenderness.  Musculoskeletal:  Normal range of motion. She exhibits no edema or tenderness.  Pt is able to move upper and lower extremities without any signs of pain or deformity   Neurological: She is alert.  Awake and alert but not oriented to place or time  Skin: Skin is warm and dry.  Excoriated rash over the trunk, chest, abdomen and back. 1 cm superficial laceration to right second MCP joint.   Psychiatric: She has a normal mood and affect. Judgment normal.  Nursing note and vitals reviewed.  ED Treatments / Results  Labs (all labs ordered are listed, but only abnormal results are displayed) Labs Reviewed  CBC WITH DIFFERENTIAL/PLATELET - Abnormal; Notable for the following:       Result Value   WBC 13.6 (*)    Neutro Abs 7.9 (*)    Eosinophils Absolute 1.0 (*)    All  other components within normal limits  D-DIMER, QUANTITATIVE (NOT AT [facility]) - Abnormal; Notable for the following:    D-Dimer, Quant 1.35 (*)    All other components within normal limits  I-STAT CHEM 8, ED - Abnormal; Notable for the following:    Potassium 3.1 (*)    Chloride 97 (*)    BUN 28 (*)    Creatinine, Ser 1.50 (*)    Glucose, Bld 109 (*)    All other components within normal limits  URINALYSIS, ROUTINE W REFLEX MICROSCOPIC (NOT AT [facility])    EKG  EKG Interpretation None       Radiology Dg Chest 2 View  Result Date: 08/20/2016 CLINICAL DATA:  82 year old female with hypoxia and shortness of breath. EXAM: CHEST  2 VIEW COMPARISON:  Chest radiograph dated 10/01/2011 FINDINGS: Two views of the chest demonstrates emphysematous changes of the lungs. There bibasilar linear atelectasis/ scarring. Infiltrate is less likely but not excluded. Clinical correlation is recommended. No pleural effusion or pneumothorax. There is stable mild cardiomegaly. There is osteopenia with degenerative changes of the spine. Right shoulder hemiarthroplasty. No acute fracture. IMPRESSION: Bibasilar linear atelectasis/ scarring versus less likely  infiltrate. Clinical correlation is recommended. Electronically Signed   By: Anner Crete M.D.   On: 08/20/2016 02:15   Ct Angio Chest Pe W Or Wo Contrast  Result Date: 08/20/2016 CLINICAL DATA:  82 year old female with hypoxia and elevated D-dimer. EXAM: CT ANGIOGRAPHY CHEST WITH CONTRAST TECHNIQUE: Multidetector CT imaging of the chest was performed using the standard protocol during bolus administration of intravenous contrast. Multiplanar CT image reconstructions and MIPs were obtained to evaluate the vascular anatomy. CONTRAST:  80 cc Isovue 370 COMPARISON:  Chest radiograph dated 08/20/2016 FINDINGS: There is emphysematous changes of the lungs. Right lung base linear atelectasis/scarring. No focal consolidation, pleural effusion, or pneumothorax. The central airways are patent. Advanced atherosclerotic calcification of the thoracic aorta. No aneurysmal dilatation or evidence of dissection. Evaluation of the pulmonary arteries is somewhat limited due to respiratory motion artifact and suboptimal opacification and visualization of the distal branches. No central pulmonary artery embolus identified. There is no cardiomegaly or pericardial effusion. Advanced coronary vascular calcification. No hilar or mediastinal adenopathy identified. Esophagus is grossly unremarkable. Bilateral calcified thyroid nodules noted. There is no axillary adenopathy. Left breast implant. There is osteopenia with degenerative changes of the spine. No acute fracture. The partially visualized 2 cm hypodense lesion in the region of the left kidney, incompletely characterized but possibly a renal cyst. Further evaluation with nonemergent renal ultrasound recommended. There is an 8 mm left adrenal adenoma. The visualized upper abdomen is otherwise unremarkable. IMPRESSION: No acute intrathoracic pathology. No CT evidence of central pulmonary artery embolus. Advanced atherosclerotic calcification of the thoracic aorta. No dissection.  Emphysema.  No focal consolidation. Small left adrenal adenoma and a partially visualized probable left renal upper pole cyst. Further evaluation with nonemergent renal ultrasound recommended. Electronically Signed   By: Anner Crete M.D.   On: 08/20/2016 06:40    Procedures Procedures (including critical care time)  Medications Ordered in ED Medications  0.9 %  sodium chloride infusion (not administered)  iopamidol (ISOVUE-370) 76 % injection 100 mL (80 mLs Intravenous Contrast Given 08/20/16 0609)    DIAGNOSTIC STUDIES:  Oxygen Saturation is 67% on room air, low by my interpretation.    COORDINATION OF CARE:  11:37 PM Discussed treatment plan with pt at bedside and pt agreed to plan.  Initial Impression / Assessment and Plan / ED Course  I have reviewed the triage vital signs and the nursing notes.  Pertinent labs & imaging results that were available during my care of the patient were reviewed by me and considered in my medical decision making (see chart for details).  Clinical Course    Patient is an 82 year old female sent in by nursing facility for a recurrent fall. Patient has been seen 3 times in the last several weeks for unwitnessed falls. She is denying any complaints at this time however patient is noted to be hypoxic around 83-84% on room air. When patient goes to sleep she desats to 67%. At the facility patient has never been on oxygen therapy. She was hypoxic at her visit 2 days ago with O2 sats in the high 80s. Prior to that there was no documented hypoxia. Patient denies shortness of breath and is currently not coughing however white count today is 13,000 and chest x-ray with atelectasis versus infiltrate. D-dimer is elevated. Urine has cleared and no longer appears to be infected with Escherichia coli from 9/11.  Will do a CT to further evaluate and ensure that patient does not have clot causing her hypoxia.  7:34 AM CT neg for PE or other acute process at this  time unclear what is casuing pt's hypoxia.  Pt is unable to get O2 back at her facility.  Trop pending and pt admitted. I personally performed the services described in this documentation, which was scribed in my presence. The recorded information has been reviewed and is accurate.   Final Clinical Impressions(s) / ED Diagnoses   Final diagnoses:  Hypoxia    New Prescriptions New Prescriptions   No medications on file     Blanchie Dessert, MD 08/20/16 [phone]

## 2016-08-19 NOTE — ED Triage Notes (Signed)
Pt comes to ed, [facility], unwitnessed fall, small bump on forehead, and lac to right index finger. No loc, no pain,172/80, pulse 68, rr 20   dementia normal a baseline, DNR, no LOC, no blood thinners.

## 2016-08-19 NOTE — ED Notes (Signed)
Bed: WA04 Expected date:  Expected time:  Means of arrival:  Comments: 82 yo F  Fall  Bruise to forehead, lac to finger

## 2016-08-20 ENCOUNTER — Emergency Department (HOSPITAL_COMMUNITY): Payer: Medicare Other

## 2016-08-20 ENCOUNTER — Encounter (HOSPITAL_COMMUNITY): Payer: Self-pay

## 2016-08-20 DIAGNOSIS — R0902 Hypoxemia: Secondary | ICD-10-CM | POA: Diagnosis not present

## 2016-08-20 DIAGNOSIS — J439 Emphysema, unspecified: Secondary | ICD-10-CM | POA: Diagnosis not present

## 2016-08-20 DIAGNOSIS — R296 Repeated falls: Secondary | ICD-10-CM

## 2016-08-20 DIAGNOSIS — R9431 Abnormal electrocardiogram [ECG] [EKG]: Secondary | ICD-10-CM | POA: Diagnosis present

## 2016-08-20 DIAGNOSIS — I4581 Long QT syndrome: Secondary | ICD-10-CM

## 2016-08-20 DIAGNOSIS — L8990 Pressure ulcer of unspecified site, unspecified stage: Secondary | ICD-10-CM | POA: Insufficient documentation

## 2016-08-20 DIAGNOSIS — N183 Chronic kidney disease, stage 3 (moderate): Secondary | ICD-10-CM | POA: Diagnosis not present

## 2016-08-20 DIAGNOSIS — I5032 Chronic diastolic (congestive) heart failure: Secondary | ICD-10-CM | POA: Diagnosis not present

## 2016-08-20 DIAGNOSIS — D72829 Elevated white blood cell count, unspecified: Secondary | ICD-10-CM | POA: Diagnosis present

## 2016-08-20 DIAGNOSIS — F0390 Unspecified dementia without behavioral disturbance: Secondary | ICD-10-CM

## 2016-08-20 LAB — I-STAT CHEM 8, ED
BUN: 28 mg/dL — AB (ref 6–20)
CHLORIDE: 97 mmol/L — AB (ref 101–111)
CREATININE: 1.5 mg/dL — AB (ref 0.44–1.00)
Calcium, Ion: 1.18 mmol/L (ref 1.15–1.40)
Glucose, Bld: 109 mg/dL — ABNORMAL HIGH (ref 65–99)
HEMATOCRIT: 37 % (ref 36.0–46.0)
HEMOGLOBIN: 12.6 g/dL (ref 12.0–15.0)
POTASSIUM: 3.1 mmol/L — AB (ref 3.5–5.1)
Sodium: 142 mmol/L (ref 135–145)
TCO2: 31 mmol/L (ref 0–100)

## 2016-08-20 LAB — CBC WITH DIFFERENTIAL/PLATELET
BASOS ABS: 0.1 10*3/uL (ref 0.0–0.1)
BASOS PCT: 0 %
Eosinophils Absolute: 1 10*3/uL — ABNORMAL HIGH (ref 0.0–0.7)
Eosinophils Relative: 8 %
HEMATOCRIT: 37.9 % (ref 36.0–46.0)
HEMOGLOBIN: 12 g/dL (ref 12.0–15.0)
LYMPHS PCT: 28 %
Lymphs Abs: 3.8 10*3/uL (ref 0.7–4.0)
MCH: 29.2 pg (ref 26.0–34.0)
MCHC: 31.7 g/dL (ref 30.0–36.0)
MCV: 92.2 fL (ref 78.0–100.0)
Monocytes Absolute: 0.9 10*3/uL (ref 0.1–1.0)
Monocytes Relative: 7 %
NEUTROS ABS: 7.9 10*3/uL — AB (ref 1.7–7.7)
NEUTROS PCT: 57 %
Platelets: 315 10*3/uL (ref 150–400)
RBC: 4.11 MIL/uL (ref 3.87–5.11)
RDW: 14.9 % (ref 11.5–15.5)
WBC: 13.6 10*3/uL — ABNORMAL HIGH (ref 4.0–10.5)

## 2016-08-20 LAB — URINALYSIS, ROUTINE W REFLEX MICROSCOPIC
Bilirubin Urine: NEGATIVE
Glucose, UA: NEGATIVE mg/dL
Hgb urine dipstick: NEGATIVE
KETONES UR: NEGATIVE mg/dL
LEUKOCYTES UA: NEGATIVE
NITRITE: NEGATIVE
PROTEIN: NEGATIVE mg/dL
SPECIFIC GRAVITY, URINE: 1.009 (ref 1.005–1.030)
pH: 5.5 (ref 5.0–8.0)

## 2016-08-20 LAB — MRSA PCR SCREENING: MRSA by PCR: NEGATIVE

## 2016-08-20 LAB — I-STAT TROPONIN, ED: Troponin i, poc: 0.06 ng/mL (ref 0.00–0.08)

## 2016-08-20 LAB — D-DIMER, QUANTITATIVE: D-Dimer, Quant: 1.35 ug/mL-FEU — ABNORMAL HIGH (ref 0.00–0.50)

## 2016-08-20 LAB — MAGNESIUM: MAGNESIUM: 2.2 mg/dL (ref 1.7–2.4)

## 2016-08-20 MED ORDER — FUROSEMIDE 40 MG PO TABS
40.0000 mg | ORAL_TABLET | Freq: Two times a day (BID) | ORAL | Status: DC
  Administered 2016-08-21 – 2016-08-22 (×3): 40 mg via ORAL
  Filled 2016-08-20 (×3): qty 1

## 2016-08-20 MED ORDER — SODIUM CHLORIDE 0.9 % IV BOLUS (SEPSIS)
500.0000 mL | Freq: Once | INTRAVENOUS | Status: AC
  Administered 2016-08-20: 500 mL via INTRAVENOUS

## 2016-08-20 MED ORDER — GUAIFENESIN-DM 100-10 MG/5ML PO SYRP: 5.0000 mL | ORAL_SOLUTION | Freq: Four times a day (QID) | ORAL | Status: DC | PRN

## 2016-08-20 MED ORDER — SENNOSIDES-DOCUSATE SODIUM 8.6-50 MG PO TABS
1.0000 | ORAL_TABLET | Freq: Every day | ORAL | Status: DC
  Administered 2016-08-22 – 2016-08-25 (×3): 1 via ORAL
  Filled 2016-08-20 (×5): qty 1

## 2016-08-20 MED ORDER — LAMOTRIGINE 25 MG PO TABS
50.0000 mg | ORAL_TABLET | Freq: Every day | ORAL | Status: DC
  Administered 2016-08-20 – 2016-08-26 (×7): 50 mg via ORAL
  Filled 2016-08-20 (×7): qty 2

## 2016-08-20 MED ORDER — METOPROLOL TARTRATE 25 MG PO TABS
25.0000 mg | ORAL_TABLET | Freq: Two times a day (BID) | ORAL | Status: DC
  Administered 2016-08-20 – 2016-08-22 (×3): 25 mg via ORAL
  Filled 2016-08-20 (×3): qty 1

## 2016-08-20 MED ORDER — HYDRALAZINE HCL 50 MG PO TABS
75.0000 mg | ORAL_TABLET | Freq: Three times a day (TID) | ORAL | Status: DC
  Administered 2016-08-20 – 2016-08-22 (×4): 75 mg via ORAL
  Filled 2016-08-20 (×5): qty 1

## 2016-08-20 MED ORDER — IOPAMIDOL (ISOVUE-370) INJECTION 76%
100.0000 mL | Freq: Once | INTRAVENOUS | Status: AC | PRN
  Administered 2016-08-20: 80 mL via INTRAVENOUS

## 2016-08-20 MED ORDER — VITAMIN D3 25 MCG (1000 UNIT) PO TABS
1000.0000 [IU] | ORAL_TABLET | Freq: Every day | ORAL | Status: DC
  Administered 2016-08-20 – 2016-08-22 (×3): 1000 [IU] via ORAL
  Filled 2016-08-20 (×3): qty 1

## 2016-08-20 MED ORDER — ACETAMINOPHEN 500 MG PO TABS: 500.0000 mg | ORAL_TABLET | ORAL | Status: DC | PRN

## 2016-08-20 MED ORDER — MEMANTINE HCL-DONEPEZIL HCL ER 28-10 MG PO CP24: 1.0000 | ORAL_CAPSULE | Freq: Every day | ORAL | Status: DC

## 2016-08-20 MED ORDER — POTASSIUM CHLORIDE IN NACL 40-0.9 MEQ/L-% IV SOLN
INTRAVENOUS | Status: AC
  Administered 2016-08-20: 75 mL/h via INTRAVENOUS
  Filled 2016-08-20: qty 1000

## 2016-08-20 MED ORDER — POTASSIUM CHLORIDE CRYS ER 20 MEQ PO TBCR
20.0000 meq | EXTENDED_RELEASE_TABLET | Freq: Two times a day (BID) | ORAL | Status: DC
  Administered 2016-08-20 – 2016-08-26 (×12): 20 meq via ORAL
  Filled 2016-08-20 (×8): qty 1
  Filled 2016-08-20: qty 2
  Filled 2016-08-20 (×4): qty 1

## 2016-08-20 MED ORDER — ENOXAPARIN SODIUM 30 MG/0.3ML ~~LOC~~ SOLN
30.0000 mg | SUBCUTANEOUS | Status: DC
  Administered 2016-08-20 – 2016-08-23 (×4): 30 mg via SUBCUTANEOUS
  Filled 2016-08-20 (×4): qty 0.3

## 2016-08-20 MED ORDER — SERTRALINE HCL 50 MG PO TABS
100.0000 mg | ORAL_TABLET | Freq: Every day | ORAL | Status: DC
  Administered 2016-08-20 – 2016-08-24 (×5): 100 mg via ORAL
  Administered 2016-08-25: 50 mg via ORAL
  Administered 2016-08-26: 100 mg via ORAL
  Filled 2016-08-20 (×7): qty 2

## 2016-08-20 MED ORDER — ALPRAZOLAM 0.25 MG PO TABS
0.2500 mg | ORAL_TABLET | Freq: Three times a day (TID) | ORAL | Status: DC | PRN
  Administered 2016-08-21 (×2): 0.25 mg via ORAL
  Filled 2016-08-20 (×2): qty 1

## 2016-08-20 MED ORDER — IPRATROPIUM-ALBUTEROL 0.5-2.5 (3) MG/3ML IN SOLN: 3.0000 mL | Freq: Four times a day (QID) | RESPIRATORY_TRACT | Status: DC | PRN

## 2016-08-20 MED ORDER — CLONIDINE HCL 0.2 MG PO TABS
0.3000 mg | ORAL_TABLET | Freq: Two times a day (BID) | ORAL | Status: DC
  Administered 2016-08-20 – 2016-08-22 (×3): 0.3 mg via ORAL
  Filled 2016-08-20: qty 1
  Filled 2016-08-20: qty 3
  Filled 2016-08-20 (×2): qty 1

## 2016-08-20 MED ORDER — SODIUM CHLORIDE 0.9 % IV SOLN
INTRAVENOUS | Status: DC
  Administered 2016-08-20: 08:00:00 via INTRAVENOUS

## 2016-08-20 MED ORDER — HYDROCODONE-ACETAMINOPHEN 5-325 MG PO TABS: 1.0000 | ORAL_TABLET | Freq: Four times a day (QID) | ORAL | 0 refills | Status: DC | PRN

## 2016-08-20 MED ORDER — LOSARTAN POTASSIUM 50 MG PO TABS
100.0000 mg | ORAL_TABLET | Freq: Every day | ORAL | Status: DC
  Administered 2016-08-20 – 2016-08-22 (×3): 100 mg via ORAL
  Filled 2016-08-20 (×3): qty 2

## 2016-08-20 MED ORDER — DILTIAZEM HCL ER 240 MG PO CP24
240.0000 mg | ORAL_CAPSULE | Freq: Every day | ORAL | Status: DC
  Administered 2016-08-20: 240 mg via ORAL
  Filled 2016-08-20 (×3): qty 1

## 2016-08-20 MED ORDER — HYDROXYZINE HCL 10 MG PO TABS
10.0000 mg | ORAL_TABLET | Freq: Three times a day (TID) | ORAL | Status: AC | PRN
  Administered 2016-08-20 – 2016-08-21 (×3): 10 mg via ORAL
  Filled 2016-08-20 (×6): qty 1

## 2016-08-20 MED ORDER — MAGNESIUM HYDROXIDE 400 MG/5ML PO SUSP: 30.0000 mL | Freq: Every evening | ORAL | Status: DC | PRN

## 2016-08-20 MED ORDER — MEMANTINE HCL ER 28 MG PO CP24
28.0000 mg | ORAL_CAPSULE | Freq: Every day | ORAL | Status: DC
  Administered 2016-08-20 – 2016-08-26 (×7): 28 mg via ORAL
  Filled 2016-08-20 (×7): qty 1

## 2016-08-20 NOTE — Care Management Obs Status (Signed)
[place] NOTIFICATION   Patient Details  Name: Kaylee Turner MRN: [id number] Date of Birth: 1931/08/07   Medicare Observation Status Notification Given:  Yes    MahabirJuliann Pulse, RN 08/20/2016, 12:33 PM

## 2016-08-20 NOTE — ED Notes (Signed)
Patient transported to CT 

## 2016-08-20 NOTE — ED Notes (Signed)
Pt was on oxygen at 03:29 the doctor Maryan Rued turned off the pts oxygen to see what her o2 stats were. Pts o2 then dropped to 67% then me and the nurse travia put the patient back on 3 liters of oxygen and informed the doctor.

## 2016-08-20 NOTE — H&P (Addendum)
History and Physical  Kaylee Turner [id number] DOB: 25-May-1931 DOA: 08/19/2016  Referring physician: Maryan Rued, MD PCP: Blanchie Serve, MD   Chief Complaint: multiple unwitnessed falls  Historian: Pt has dementia which limits history, information taken from ED provider and records  HPI: Kaylee Turner is a 82 y.o. female with PMHx of dementia, fibromyalgia and HTN, who presents to the Emergency Department from [facility] for an evaluation s/p an unwitnessed fall that occurred this evening.  She had recently been seen in ED several days ago for another unwitnessed fall and was sent back to facility after being worked up.   Pt has a bump to her central forehead and laceration on her right index finger. She denies pain to any area of her body. She denies shortness of breath and any other complaints.  She was noted to be severely hypoxic on exam and her pulse ox dropped into the 60s without supplemental oxygen. She has emphysema but had not been on oxygen previously.  The patient was fully evaluated in ED but no pulmonary embolus or pneumonia was found to explain her hypoxia.  She is being admitted for further observation and management.    Review of Systems: Pt has dementia unable to fully obtain  Past Medical History:  Diagnosis Date  . Cancer ([place])   . Dementia   . Fibromyalgia   . Hyperaldosteronism ([place])   . Hypertension   . Hypokalemia   . Renal disorder    CKD  . Vitamin D deficiency    Past Surgical History:  Procedure Laterality Date  . BREAST SURGERY  RIGHT LUMPECTOMY   Social History:  reports that she has quit smoking. She does not have any smokeless tobacco history on file. She reports that she does not drink alcohol or use drugs.   Allergies  Allergen Reactions  . Levaquin [Levofloxacin In D5w]     Per MAR  . Penicillins     Per Mar  . Latex Rash    Causes blisters     Family History  Problem Relation Age of Onset  . Crohn's disease  Daughter   . COPD Daughter     Prior to Admission medications   Medication Sig Start Date End Date Taking? Authorizing Provider  acetaminophen (TYLENOL) 500 MG tablet Take 500 mg by mouth every 4 (four) hours as needed for mild pain, fever or headache. DO NOT EXCEED 4 GM OF TYLENOL IN 24 HOURS   Yes Historical Provider, MD  ALPRAZolam (XANAX) 0.25 MG tablet Take 0.25 mg by mouth every 8 (eight) hours as needed for anxiety.    Yes Historical Provider, MD  alum & mag hydroxide-simeth (MINTOX) 200-200-20 MG/5ML suspension Take 30 mLs by mouth as needed for indigestion or heartburn. No more than 4 doses per 24 hours   Yes Historical Provider, MD  Calcium Carb-Cholecalciferol (OYSTER SHELL CALCIUM 500+D PO) Take 1 tablet by mouth daily.    Yes Historical Provider, MD  Cholecalciferol (VITAMIN D-3) 1000 UNITS CAPS Take 1,000 Units by mouth daily.    Yes Historical Provider, MD  cloNIDine (CATAPRES) 0.3 MG tablet Take 0.3 mg by mouth 2 (two) times daily.   Yes Historical Provider, MD  diltiazem (DILACOR XR) 240 MG 24 hr capsule Take 240 mg by mouth daily.   Yes Historical Provider, MD  furosemide (LASIX) 20 MG tablet Take 60 mg by mouth 2 (two) times daily.    Yes Historical Provider, MD  guaifenesin (ROBITUSSIN) 100 MG/5ML syrup  Take 200 mg by mouth every 6 (six) hours as needed for cough.   Yes Historical Provider, MD  hydrALAZINE (APRESOLINE) 25 MG tablet Take 75 mg by mouth 3 (three) times daily.   Yes Historical Provider, MD  lamoTRIgine (LAMICTAL) 25 MG tablet Take 50 mg by mouth daily.   Yes Historical Provider, MD  loperamide (IMODIUM) 2 MG capsule Take 2 mg by mouth as needed for diarrhea or loose stools. No more than 8 doses in 24 hrs   Yes Historical Provider, MD  losartan (COZAAR) 100 MG tablet Take 100 mg by mouth daily.   Yes Historical Provider, MD  magnesium hydroxide (MILK OF MAGNESIA) 400 MG/5ML suspension Take 30 mLs by mouth at bedtime as needed for mild constipation.   Yes Historical  Provider, MD  Memantine HCl-Donepezil HCl (NAMZARIC) 28-10 MG CP24 Take by mouth daily.   Yes Historical Provider, MD  metoprolol tartrate (LOPRESSOR) 25 MG tablet Take 25 mg by mouth 2 (two) times daily.    Yes Historical Provider, MD  Neomycin-Bacitracin-Polymyxin (TRIPLE ANTIBIOTIC) [id number] OINT Apply 1 application topically as needed (wound care).   Yes Historical Provider, MD  potassium chloride SA (K-DUR,KLOR-CON) 20 MEQ tablet Take 20 mEq by mouth 2 (two) times daily.   Yes Historical Provider, MD  sennosides-docusate sodium (SENOKOT-S) 8.6-50 MG tablet Take 1 tablet by mouth at bedtime.   Yes Historical Provider, MD  sertraline (ZOLOFT) 100 MG tablet Take 100 mg by mouth daily.    Yes Historical Provider, MD  HYDROcodone-acetaminophen (NORCO/VICODIN) 5-325 MG tablet Take 1 tablet by mouth every 6 (six) hours as needed. 08/20/16   Blanchie Dessert, MD   Physical Exam: Vitals:   08/20/16 0329 08/20/16 0345 08/20/16 0500 08/20/16 0733  BP: 152/60  155/81 153/72  Pulse: (!) 57  72 78  Resp: 18  20 16   Temp: 98.3 F (36.8 C)     TempSrc: Oral     SpO2: (!) 89% (!) 67% 96% 98%   Constitutional: She appears well-developed and well-nourished. No distress.  HENT: Head: Normocephalic.  Contusion over forehead with healing ecchymosis.  Eyes: EOM are normal.  Neck: Normal range of motion. C-spine is normal  Cardiovascular: Normal rate, regular rhythm and normal heart sounds.   Pulmonary/Chest: Effort normal and breath sounds normal.  Abdominal: Soft. She exhibits no distension. There is no tenderness.  Musculoskeletal: Normal range of motion. She exhibits no edema or tenderness.  Pt is able to move upper and lower extremities without any signs of pain or deformity   Neurological: She is alert. Awake and alert but not oriented to place or time  Skin: Skin is warm and dry. Excoriated rash over the trunk, chest, abdomen and back. 1 cm superficial laceration to right second MCP joint.     Psychiatric: She has a normal mood and affect. Judgment normal.   Labs on Admission:  Basic Metabolic Panel:  Recent Labs Lab 08/20/16 0258 08/20/16 0303  NA  --  142  K  --  3.1*  CL  --  97*  GLUCOSE  --  109*  BUN  --  28*  CREATININE  --  1.50*  MG 2.2  --    Liver Function Tests: No results for input(s): AST, ALT, ALKPHOS, BILITOT, PROT, ALBUMIN in the last 168 hours. No results for input(s): LIPASE, AMYLASE in the last 168 hours. No results for input(s): AMMONIA in the last 168 hours. CBC:  Recent Labs Lab 08/20/16 0258 08/20/16 0303  WBC 13.6*  --  NEUTROABS 7.9*  --   HGB 12.0 12.6  HCT 37.9 37.0  MCV 92.2  --   PLT 315  --    Cardiac Enzymes: No results for input(s): CKTOTAL, CKMB, CKMBINDEX, TROPONINI in the last 168 hours.  BNP (last 3 results) No results for input(s): PROBNP in the last 8760 hours. CBG: No results for input(s): GLUCAP in the last 168 hours.  Radiological Exams on Admission: Dg Chest 2 View  Result Date: 08/20/2016 CLINICAL DATA:  82 year old female with hypoxia and shortness of breath. EXAM: CHEST  2 VIEW COMPARISON:  Chest radiograph dated 10/01/2011 FINDINGS: Two views of the chest demonstrates emphysematous changes of the lungs. There bibasilar linear atelectasis/ scarring. Infiltrate is less likely but not excluded. Clinical correlation is recommended. No pleural effusion or pneumothorax. There is stable mild cardiomegaly. There is osteopenia with degenerative changes of the spine. Right shoulder hemiarthroplasty. No acute fracture. IMPRESSION: Bibasilar linear atelectasis/ scarring versus less likely infiltrate. Clinical correlation is recommended. Electronically Signed   By: Anner Crete M.D.   On: 08/20/2016 02:15   Ct Angio Chest Pe W Or Wo Contrast  Result Date: 08/20/2016 CLINICAL DATA:  82 year old female with hypoxia and elevated D-dimer. EXAM: CT ANGIOGRAPHY CHEST WITH CONTRAST TECHNIQUE: Multidetector CT imaging of the  chest was performed using the standard protocol during bolus administration of intravenous contrast. Multiplanar CT image reconstructions and MIPs were obtained to evaluate the vascular anatomy. CONTRAST:  80 cc Isovue 370 COMPARISON:  Chest radiograph dated 08/20/2016 FINDINGS: There is emphysematous changes of the lungs. Right lung base linear atelectasis/scarring. No focal consolidation, pleural effusion, or pneumothorax. The central airways are patent. Advanced atherosclerotic calcification of the thoracic aorta. No aneurysmal dilatation or evidence of dissection. Evaluation of the pulmonary arteries is somewhat limited due to respiratory motion artifact and suboptimal opacification and visualization of the distal branches. No central pulmonary artery embolus identified. There is no cardiomegaly or pericardial effusion. Advanced coronary vascular calcification. No hilar or mediastinal adenopathy identified. Esophagus is grossly unremarkable. Bilateral calcified thyroid nodules noted. There is no axillary adenopathy. Left breast implant. There is osteopenia with degenerative changes of the spine. No acute fracture. The partially visualized 2 cm hypodense lesion in the region of the left kidney, incompletely characterized but possibly a renal cyst. Further evaluation with nonemergent renal ultrasound recommended. There is an 8 mm left adrenal adenoma. The visualized upper abdomen is otherwise unremarkable. IMPRESSION: No acute intrathoracic pathology. No CT evidence of central pulmonary artery embolus. Advanced atherosclerotic calcification of the thoracic aorta. No dissection. Emphysema.  No focal consolidation. Small left adrenal adenoma and a partially visualized probable left renal upper pole cyst. Further evaluation with nonemergent renal ultrasound recommended. Electronically Signed   By: Anner Crete M.D.   On: 08/20/2016 06:40   EKG: Independently reviewed.  Assessment/Plan Principal Problem:    Hypoxia Active Problems:   Hypokalemia   Chronic kidney disease (CKD)   COPD (chronic obstructive pulmonary disease) with emphysema (HCC)   Prolonged Q-T interval on ECG   Essential hypertension   PVD (peripheral vascular disease) (HCC)   Hypertensive heart failure (HCC)   Primary generalized (osteo)arthritis   Dementia, senile   Diabetes mellitus, new onset (HCC)   Bilateral edema of lower extremity   Chronic diastolic heart failure (HCC)   Frequent falls   Leukocytosis   1. Frequent unwitnessed falls at nursing facility - The patient has dementia and unable to explain the circumstances of these fall.   She has been placed  on Fall precautions.  I requested a PT evaluation to help determine and assess her ambulation condition. These falls can also be related to her severe hypoxia.  She appears clinically dehydrated and had been on diuretics. 2. Hypoxia - I suspect that this is secondary to her emphysema.  She does not appear to have a PE or pneumonia by CT chest findings.  Her hypoxia is rapidly improved and easily managed with supplemental oxygen by [place].  She likely is going to need to be on this continuously as her pulse ox drops to low 60s when oxygen is discontinued.  3. Hypokalemia - I suspect this is secondary to the diuretics and probably poor oral intake and subsequent missed doses of potassium supplement. Potassium added to IV fluid, magnesium within normal limits, continue oral potassium replacement as well. Holding diuretic today. 4. Mild dehydration-gentle IV fluid hydration ordered over the next 12 hours. 5. COPD/emphysema-dual nebs ordered for wheezing, oxygen by nasal cannula 2 L/m ordered. 6. CKD stage 3-creatinine appears to be stable from previous testing. Avoid nephrotoxic agents and renally dose medications. Recheck in the morning. 7. Prolonged QTc on ECG-this seems to be a newer finding when looking at a older ECG tests. She is on some medications that can prolong QTC  which may need to be addressed. Plan to repeat ECG in the morning after correcting electrolytes. 8. Hypertensive chronic diastolic heart failure - most recent Echo found was from 2011 where she was noted to have reasonably good systolic function but grade 1 diastolic heart dysfunction. 9. Diet controlled diabetes - carb modified heart healthy diet ordered. 10. Dementia-holding donepezil secondary to prolonged QTc. 11. Leukocytosis-no source of infection found on exam, likely reactive to recent fall, repeat CBC in the morning. 12. Essential Hypertension-resume her home blood pressure medications as her pressure has been difficult to control. Adjust medication treatment regimen as needed.   DVT Prophylaxis: lovenox Code Status: full  Family Communication:  Disposition Plan: SNF   Time spent: 20 mins  Irwin Brakeman, MD [facility] Pager [phone]  If 7PM-7AM, please contact night-coverage [email or web address] Password [email or web address] 08/20/2016, 9:20 AM

## 2016-08-20 NOTE — Care Management Note (Signed)
Case Management Note  Patient Details  Name: Kaylee Turner MRN: [id number] Date of Birth: 1931-01-09  Subjective/Objective:   82 y/o f admitted w/Fall, hypoxia. From ALF-[facility]. PT cons-await recc.CSW following. On 02-will monitor if needed for returning back to ALF.                Action/Plan:d/c plan SNF   Expected Discharge Date:                 Expected Discharge Plan:  [place] (ALF vs SNF)  In-House Referral:  Clinical Social Work  Discharge planning Services  CM Consult  Post Acute Care Choice:    Choice offered to:     DME Arranged:    DME Agency:     HH Arranged:    [place] Agency:     Status of Service:  In process, will continue to follow  If discussed at Long Length of Stay Meetings, dates discussed:    Additional Comments:  Dessa Phi, RN 08/20/2016, 11:11 AM

## 2016-08-20 NOTE — Evaluation (Signed)
Physical Therapy Evaluation Patient Details Name: Kaylee Turner MRN: [id number] DOB: [birth year] Today's Date: 08/20/2016   History of Present Illness  Kaylee Turner is a 82 y.o. female with PMHx of dementia, fibromyalgia and HTN, who presents to the Emergency Department 9/27  from [facility] for an evaluation s/p an unwitnessed fall that occurred this evening.  She had recently been seen in ED several days ago for another unwitnessed fall and was sent back to facility after being worked up.    She denies pain to any area of her body. She denies shortness of breath and any other complaints.  She was noted to be severely hypoxic on exam and her pulse ox dropped into the 60s without supplemental oxygen. She has emphysema but had not been on oxygen previously.  Negative for pulmonary embolus or pneumonia .She is being admitted for further observation and management.    Clinical Impression  The patient was asleep and became agitated and aggressive with attempts to sit up. The sister and daughter report that this is a change in behavior from earlier. Pt admitted with above diagnosis. Pt currently with functional limitations due to the deficits listed below (see PT Problem List). Pt will benefit from skilled PT to increase their independence and safety with mobility to allow discharge to the venue listed below.       Follow Up Recommendations Home health PT (ALF if able to mobilize.)    Equipment Recommendations  None recommended by PT    Recommendations for Other Services       Precautions / Restrictions Precautions Precautions: Fall Precaution Comments: monitor oxygen sats      Mobility  Bed Mobility Overal bed mobility: Needs Assistance Bed Mobility: Supine to Sit     Supine to sit: Mod assist     General bed mobility comments: patient was  somnolent and became agitated with assist to sitting  up.  Transfers                 General transfer comment: CNA  reports 1 assist to [facility] earlier.  Ambulation/Gait                Stairs            Wheelchair Mobility    Modified Rankin (Stroke Patients Only)       Balance                                             Pertinent Vitals/Pain Pain Assessment: Faces Faces Pain Scale: No hurt    Home Living Family/patient expects to be discharged to:: Assisted living               Home Equipment: Wheelchair - manual Additional Comments: per sister    Prior Function Level of Independence: Needs assistance   Gait / Transfers Assistance Needed: Per sister in the room, patient was independent transfers to [facility]  and to BR, to meals , maybe ambulated pushing the WC           Hand Dominance        Extremity/Trunk Assessment   Upper Extremity Assessment: Generalized weakness           Lower Extremity Assessment: Generalized weakness         Communication      Cognition Arousal/Alertness: Lethargic Behavior During Therapy: Restless;Agitated Overall  Cognitive Status: Impaired/Different from baseline Area of Impairment: Following commands;Awareness               General Comments: The CNA stated that the patient was not at all combative/agitated  earlier. The patient became more agitated with efforts.     General Comments      Exercises     Assessment/Plan    PT Assessment Patient needs continued PT services  PT Problem List Decreased activity tolerance;Decreased mobility;Decreased cognition          PT Treatment Interventions DME instruction;Functional mobility training;Therapeutic activities;Patient/family education;Cognitive remediation    PT Goals (Current goals can be found in the Care Plan section)  Acute Rehab PT Goals Patient Stated Goal: to return to ALF if able. To find out what is causing her to fall. PT Goal Formulation: With family Time For Goal Achievement: 09/03/16 Potential to Achieve Goals: Fair     Frequency Min 3X/week   Barriers to discharge        Co-evaluation               End of Session   Activity Tolerance: Patient limited by lethargy;Treatment limited secondary to agitation Patient left: in bed;with call bell/phone within reach;with bed alarm set Nurse Communication: Mobility status    Functional Assessment Tool Used: clinical judgement Functional Limitation: Mobility: Walking and moving around Mobility: Walking and Moving Around Current Status [id number]): 100 percent impaired, limited or restricted Mobility: Walking and Moving Around Goal Status [id number]): At least 1 percent but less than 20 percent impaired, limited or restricted    Time: 1700-1725 PT Time Calculation (min) (ACUTE ONLY): 25 min   Charges:   PT Evaluation $PT Eval Low Complexity: 1 Procedure PT Treatments $Therapeutic Activity: 8-22 mins   PT G Codes:   PT G-Codes **NOT FOR INPATIENT CLASS** Functional Assessment Tool Used: clinical judgement Functional Limitation: Mobility: Walking and moving around Mobility: Walking and Moving Around Current Status [id number]): 100 percent impaired, limited or restricted Mobility: Walking and Moving Around Goal Status [id number]): At least 1 percent but less than 20 percent impaired, limited or restricted    Kaylee Turner 08/20/2016, 5:42 PM

## 2016-08-20 NOTE — Care Management Note (Signed)
Case Management Note  Patient Details  Name: Kaylee Turner MRN: [id number] Date of Birth: 01/03/1931  Subjective/Objective:                   82 y.o. female with PMHx of dementia, fibromyalgia and HTN, who presents to the Emergency Department from [place], for an evaluation s/p an unwitnessed fall that occurred this evening.  Action/Plan: Follow for disposition needs; anticipate home oxygen.   Expected Discharge Date:  08/22/16               Expected Discharge Plan:   (ALF vs SNF)  In-House Referral:  Clinical Social Work  Discharge planning Services  CM Consult  Post Acute Care Choice:    Choice offered to:     DME Arranged:    DME Agency:     HH Arranged:    [place] Agency:     Status of Service:  In process, will continue to follow  If discussed at Long Length of Stay Meetings, dates discussed:    Additional Comments:  Fuller Mandril, RN 08/20/2016, 10:00 AM

## 2016-08-21 DIAGNOSIS — R0902 Hypoxemia: Secondary | ICD-10-CM | POA: Diagnosis not present

## 2016-08-21 LAB — BASIC METABOLIC PANEL
ANION GAP: 5 (ref 5–15)
BUN: 27 mg/dL — ABNORMAL HIGH (ref 6–20)
CALCIUM: 8.8 mg/dL — AB (ref 8.9–10.3)
CO2: 30 mmol/L (ref 22–32)
Chloride: 108 mmol/L (ref 101–111)
Creatinine, Ser: 1.66 mg/dL — ABNORMAL HIGH (ref 0.44–1.00)
GFR, EST AFRICAN AMERICAN: 32 mL/min — AB (ref >60)
GFR, EST NON AFRICAN AMERICAN: 27 mL/min — AB (ref >60)
Glucose, Bld: 96 mg/dL (ref 65–99)
POTASSIUM: 3.9 mmol/L (ref 3.5–5.1)
SODIUM: 143 mmol/L (ref 135–145)

## 2016-08-21 LAB — CBC
HCT: 35.4 % — ABNORMAL LOW (ref 36.0–46.0)
HEMOGLOBIN: 10.8 g/dL — AB (ref 12.0–15.0)
MCH: 28.7 pg (ref 26.0–34.0)
MCHC: 30.5 g/dL (ref 30.0–36.0)
MCV: 94.1 fL (ref 78.0–100.0)
PLATELETS: 274 10*3/uL (ref 150–400)
RBC: 3.76 MIL/uL — AB (ref 3.87–5.11)
RDW: 15.4 % (ref 11.5–15.5)
WBC: 11.8 10*3/uL — AB (ref 4.0–10.5)

## 2016-08-21 MED ORDER — SODIUM CHLORIDE 0.9 % IV SOLN
INTRAVENOUS | Status: DC
  Administered 2016-08-21 – 2016-08-22 (×2): via INTRAVENOUS

## 2016-08-21 MED ORDER — DILTIAZEM HCL ER COATED BEADS 240 MG PO CP24
240.0000 mg | ORAL_CAPSULE | Freq: Every day | ORAL | Status: DC
  Administered 2016-08-21 – 2016-08-22 (×2): 240 mg via ORAL
  Filled 2016-08-21 (×2): qty 1

## 2016-08-21 NOTE — Progress Notes (Signed)
PT Cancellation Note  Patient Details Name: Kaylee Turner MRN: [id number] DOB: October 29, 1931   Cancelled Treatment:    Reason Eval/Treat Not Completed: Fatigue/lethargy limiting ability to participate. Unable to awaken pt for participation with therapy. Will check back as schedule allows and if pt is alert enough to participate meaningfully.    Weston Anna, MPT Pager: [phone]

## 2016-08-21 NOTE — Progress Notes (Addendum)
LCSW followed up with facility regarding patient being able to DC today or over the weekend. Patient CANNOT discharge over the weekend per Tammy at [facility].  FL2 must be signed, O2 must be set up and delivered if patient requiring O2.  home health set up.  This was all communicated to CM and treatment team. LCSW is awaiting plan for patient and treatment. Remains in observation status.  LCSW spoke with MD regarding patient plan and treatment. Currently due to patient being so lethargic, elevated BP, need for IV fluids and monitoring labs, MD feels patient is not stable to DC today to ALF.   LCSW made family aware of barriers to DC and current treatment plan. CM also made aware.  PT also pending follow up. Weekend CSW aware of issues and will follow acutely.  Lane Hacker, MSW Clinical Social Work: [employer] Coverage for :  [phone]

## 2016-08-21 NOTE — Progress Notes (Signed)
SATURATION QUALIFICATIONS: (This note is used to comply with regulatory documentation for home oxygen)  Patient Saturations on Room Air at Rest = 87%  Patient Saturations on Room Air while Ambulating = pt unable to ambulate without two person asist  Patient Saturations on 3 Liters of oxygen while Ambulating = 97 %  Please briefly explain why patient needs home oxygen:

## 2016-08-21 NOTE — Clinical Social Work Note (Signed)
Clinical Social Work Assessment  Patient Details  Name: Kaylee Turner MRN: [id number] Date of Birth: 07-26-1931  Date of referral:  08/21/16               Reason for consult:  Discharge Planning (admitted from [facility])                Permission sought to share information with:  Case Manager, Customer service manager, Family Supports Permission granted to share information::  Yes, Verbal Permission Granted  Name::        Agency::  [name]  Relationship::  Sister Tour manager Information:     Housing/Transportation Living arrangements for the past 2 months:  [place] of Information:  Medical Team, Adult Children Patient Interpreter Needed:  None Criminal Activity/Legal Involvement Pertinent to Current Situation/Hospitalization:  No - Comment as needed Significant Relationships:  Adult Children, Other Family Members, Community Support Lives with:  Facility Resident Do you feel safe going back to the place where you live?  Yes Need for family participation in patient care:  Yes (Comment) (dementia)  Care giving concerns:  LCSW was unable to complete assessment with patient due to cognitive issues and she was sleeping.  Call placed to sister Kaylee Turner. Kaylee Turner reports she has been a resident at [facility] since March of this year 2017. Kaylee Turner reports no issues with care, would like her to return, but wants her rash addressed and her over sleepiness also addressed prior to DC.   Social Worker assessment / plan:  LCSW completed assessment and updated FL2 with recommendations of home health at facility. Sister on board with discharge plan along with granddaughters involved in care. LCSW will follow up with facility regarding discharge plans.  Employment status:  Retired Nurse, adult, Medicaid In [place] PT Recommendations:  Home with [place] / Referral to community resources:      Patient/Family's Response to care:  Agreeable  Patient/Family's Understanding of and Emotional Response to Diagnosis, Current Treatment, and Prognosis:  Sister very involved in care, asking appropriate questions and aware of current treatment for patient. Agreeable once stable she would like her to return to ALF.  Emotional Assessment Appearance:  Appears stated age Attitude/Demeanor/Rapport:  Unresponsive, Uncooperative (sleeping) Affect (typically observed):  Quiet, Unable to Assess Orientation:  Oriented to Self Alcohol / Substance use:  Not Applicable Psych involvement (Current and /or in the community):  No (Comment)  Discharge Needs  Concerns to be addressed:  No discharge needs identified Readmission within the last 30 days:  Yes Current discharge risk:  Cognitively Impaired Barriers to Discharge:  Continued Medical Work up   [place], LCSW 08/21/2016, 1:20 PM

## 2016-08-21 NOTE — Progress Notes (Signed)
SATURATION QUALIFICATIONS: (This note is used to comply with regulatory documentation for home oxygen)  Patient Saturations on Room Air at Rest = 93%  Patient Saturations on Room Air while Ambulating = 83%  Patient Saturations on 2 Liters of oxygen while Ambulating = 91%  Please briefly explain why patient needs home oxygen: patient desaturated to 83% on room air while ambulating

## 2016-08-21 NOTE — Progress Notes (Signed)
Physical Therapy Treatment Patient Details Name: Kaylee Turner MRN: [id number] DOB: Mar 08, 1931 Today's Date: 08/21/2016    History of Present Illness 82 yo female admitted with frequent fall, hypoxia. Hx of dementia, fibromyalgia, CKD, HTN, falls. Pt is from an ALF    PT Comments    Mod encouragement from therapist and family for participation. Pt walked ~75 feet with a RW. She is unsteady and remains at risk for falls. Pt should be able to return to ALF but she will likely require increased assistance compared to baseline. Family reports pt was using a walker for ambulation however cognitive deficits/memory issues interfered with safe use of walker. Recommend HHPT follow up.   Follow Up Recommendations  Home health PT (at ALF-pt will likely require increased assistance)     Equipment Recommendations  None recommended by PT    Recommendations for Other Services       Precautions / Restrictions Precautions Precautions: Fall Precaution Comments: monitor oxygen sats Restrictions Weight Bearing Restrictions: No    Mobility  Bed Mobility Overal bed mobility: Needs Assistance Bed Mobility: Supine to Sit     Supine to sit: Min guard;HOB elevated     General bed mobility comments: close guard for safety. Encouragement from family provided as well.   Transfers Overall transfer level: Needs assistance Equipment used: Rolling walker (2 wheeled) Transfers: Sit to/from Stand Sit to Stand: Min assist         General transfer comment: Assist to rise, stabilize, control descent.   Ambulation/Gait Ambulation/Gait assistance: Min assist Ambulation Distance (Feet): 75 Feet Assistive device: Rolling walker (2 wheeled) Gait Pattern/deviations: Step-through pattern;Decreased stride length     General Gait Details: cues for safety, distance from walker. Assist to stabilize.    Stairs            Wheelchair Mobility    Modified [place] (Stroke Patients Only)        Balance Overall balance assessment: History of Falls;Needs assistance           Standing balance-Leahy Scale: Poor                      Cognition Arousal/Alertness: Awake/alert Behavior During Therapy: WFL for tasks assessed/performed Overall Cognitive Status: History of cognitive impairments - at baseline                      Exercises      General Comments        Pertinent Vitals/Pain Faces Pain Scale: No hurt    Home Living                      Prior Function            PT Goals (current goals can now be found in the care plan section) Progress towards PT goals: Progressing toward goals    Frequency    Min 3X/week      PT Plan Current plan remains appropriate    Co-evaluation             End of Session Equipment Utilized During Treatment: Gait belt Activity Tolerance: Patient tolerated treatment well Patient left: in chair;with call bell/phone within reach;with family/visitor present;with nursing/sitter in room;with chair alarm set     Time: 1420-1446 PT Time Calculation (min) (ACUTE ONLY): 26 min  Charges:  $Gait Training: 8-22 mins  G Codes:      Weston Anna, MPT Pager: [phone]

## 2016-08-21 NOTE — Progress Notes (Signed)
Patient ID: Kaylee Turner, female   DOB: 1931-04-10, 82 y.o.   MRN: [id number]    PROGRESS NOTE    Kaylee Turner  [id number] DOB: 03/18/1931 DOA: 08/19/2016  PCP: Blanchie Serve, MD   Brief Narrative:   82 y.o. female with PMHx of dementia, fibromyalgia and HTN, who presented to the Emergency Department from [facility] for an evaluation s/p an unwitnessed fall that occurred on the evening prior to this admission.  She had recently been seen in ED several days ago for another unwitnessed fall and was sent back to facility after being worked up.   Pt has a bump to her central forehead and laceration on her right index finger. She denied pain to any area of her body.   Assessment & Plan:   1. Frequent unwitnessed falls at nursing facility - The patient has dementia and unable to explain the circumstances of these fall.   She has denied falling at all and only recalls the last episode and says she tripped. PT eval done and HH PT recommended at the facility.  2. Hypoxia - suspect that this is secondary to her emphysema.  She does not appear to have a PE or pneumonia by CT chest findings.  Her hypoxia is rapidly improved and easily managed with supplemental oxygen by [place].  S  3. Hypokalemia - I suspect this is secondary to the diuretics and probably poor oral intake and subsequent missed doses of potassium supplement. Supplemented, repeat BMP in AM. 4. Mild dehydration-gentle IV fluid hydration provided and pt looks more euvolemic this AM 5. COPD/emphysema-dual nebs ordered for wheezing, oxygen by nasal cannula 2 L/m ordered. 6. CKD stage 3-creatinine appears to be stable from previous testing. Avoid nephrotoxic agents 7. Prolonged QTc on ECG-this seems to be a newer finding when looking at a older ECG tests. She is on some medications that can prolong QTC and we are monitoring closely.  8. Hypertensive chronic diastolic heart failure - most recent Echo found was from 2011 where  she was noted to have reasonably good systolic function but grade 1 diastolic heart dysfunction. 9. Diet controlled diabetes - carb modified heart healthy diet ordered. 10. Dementia-holding donepezil secondary to prolonged QTc. 11. Leukocytosis-no source of infection found on exam, likely reactive to recent fall, repeat CBC in the morning. 12. Hypertensive urgency-resume her home blood pressure medications  13. Obesity - Body mass index is 30.53 kg/m.    DVT prophylaxis: Lovenox Sq Code Status: Full  Family Communication: Patient at bedside  Disposition Plan: TO be determined   Consultants:   None  Procedures:   None  Antimicrobials:   None   Subjective: No events overnight.   Objective: Vitals:   08/20/16 2220 08/21/16 0100 08/21/16 0528 08/21/16 0955  BP: (!) 110/44  (!) 148/50 (!) 186/55  Pulse: (!) 54  65   Resp: 18  18   Temp: 98.4 F (36.9 C)  98.2 F (36.8 C)   TempSrc: Oral  Oral   SpO2: 97%  99%   Weight:      Height:  5\' 2"  (1.575 m)      Intake/Output Summary (Last 24 hours) at 08/21/16 1401 Last data filed at 08/21/16 0930  Gross per 24 hour  Intake           691.25 ml  Output              300 ml  Net  391.25 ml   Filed Weights   08/20/16 1102  Weight: 75.7 kg (166 lb 14.4 oz)    Examination:  General exam: Appears calm and comfortable  Respiratory system: Clear to auscultation. Respiratory effort normal. Cardiovascular system: S1 & S2 heard, RRR. No JVD, rubs, gallops or clicks. No pedal edema. Gastrointestinal system: Abdomen is nondistended, soft and nontender. No organomegaly or masses felt.   Data Reviewed: I have personally reviewed following labs and imaging studies  CBC:  Recent Labs Lab 08/20/16 0258 08/20/16 0303 08/21/16 0516  WBC 13.6*  --  11.8*  NEUTROABS 7.9*  --   --   HGB 12.0 12.6 10.8*  HCT 37.9 37.0 35.4*  MCV 92.2  --  94.1  PLT 315  --  123456   Basic Metabolic Panel:  Recent Labs Lab  08/20/16 0258 08/20/16 0303 08/21/16 0516  NA  --  142 143  K  --  3.1* 3.9  CL  --  97* 108  CO2  --   --  30  GLUCOSE  --  109* 96  BUN  --  28* 27*  CREATININE  --  1.50* 1.66*  CALCIUM  --   --  8.8*  MG 2.2  --   --    Urine analysis:    Component Value Date/Time   COLORURINE YELLOW 08/20/2016 0502   APPEARANCEUR CLEAR 08/20/2016 0502   LABSPEC 1.009 08/20/2016 0502   PHURINE 5.5 08/20/2016 0502   GLUCOSEU NEGATIVE 08/20/2016 0502   HGBUR NEGATIVE 08/20/2016 0502   BILIRUBINUR NEGATIVE 08/20/2016 0502   KETONESUR NEGATIVE 08/20/2016 0502   PROTEINUR NEGATIVE 08/20/2016 0502   UROBILINOGEN 0.2 09/30/2011 2349   NITRITE NEGATIVE 08/20/2016 0502   LEUKOCYTESUR NEGATIVE 08/20/2016 0502   Recent Results (from the past 240 hour(s))  MRSA PCR Screening     Status: None   Collection Time: 08/20/16 10:42 AM  Result Value Ref Range Status   MRSA by PCR NEGATIVE NEGATIVE Final    Comment:        The GeneXpert MRSA Assay (FDA approved for NASAL specimens only), is one component of a comprehensive MRSA colonization surveillance program. It is not intended to diagnose MRSA infection nor to guide or monitor treatment for MRSA infections.       Radiology Studies: Dg Chest 2 View  Result Date: 08/20/2016 CLINICAL DATA:  82 year old female with hypoxia and shortness of breath. EXAM: CHEST  2 VIEW COMPARISON:  Chest radiograph dated 10/01/2011 FINDINGS: Two views of the chest demonstrates emphysematous changes of the lungs. There bibasilar linear atelectasis/ scarring. Infiltrate is less likely but not excluded. Clinical correlation is recommended. No pleural effusion or pneumothorax. There is stable mild cardiomegaly. There is osteopenia with degenerative changes of the spine. Right shoulder hemiarthroplasty. No acute fracture. IMPRESSION: Bibasilar linear atelectasis/ scarring versus less likely infiltrate. Clinical correlation is recommended. Electronically Signed   By: Anner Crete M.D.   On: 08/20/2016 02:15   Ct Angio Chest Pe W Or Wo Contrast  Result Date: 08/20/2016 CLINICAL DATA:  82 year old female with hypoxia and elevated D-dimer. EXAM: CT ANGIOGRAPHY CHEST WITH CONTRAST TECHNIQUE: Multidetector CT imaging of the chest was performed using the standard protocol during bolus administration of intravenous contrast. Multiplanar CT image reconstructions and MIPs were obtained to evaluate the vascular anatomy. CONTRAST:  80 cc Isovue 370 COMPARISON:  Chest radiograph dated 08/20/2016 FINDINGS: There is emphysematous changes of the lungs. Right lung base linear atelectasis/scarring. No focal consolidation, pleural effusion, or pneumothorax.  The central airways are patent. Advanced atherosclerotic calcification of the thoracic aorta. No aneurysmal dilatation or evidence of dissection. Evaluation of the pulmonary arteries is somewhat limited due to respiratory motion artifact and suboptimal opacification and visualization of the distal branches. No central pulmonary artery embolus identified. There is no cardiomegaly or pericardial effusion. Advanced coronary vascular calcification. No hilar or mediastinal adenopathy identified. Esophagus is grossly unremarkable. Bilateral calcified thyroid nodules noted. There is no axillary adenopathy. Left breast implant. There is osteopenia with degenerative changes of the spine. No acute fracture. The partially visualized 2 cm hypodense lesion in the region of the left kidney, incompletely characterized but possibly a renal cyst. Further evaluation with nonemergent renal ultrasound recommended. There is an 8 mm left adrenal adenoma. The visualized upper abdomen is otherwise unremarkable. IMPRESSION: No acute intrathoracic pathology. No CT evidence of central pulmonary artery embolus. Advanced atherosclerotic calcification of the thoracic aorta. No dissection. Emphysema.  No focal consolidation. Small left adrenal adenoma and a partially  visualized probable left renal upper pole cyst. Further evaluation with nonemergent renal ultrasound recommended. Electronically Signed   By: Anner Crete M.D.   On: 08/20/2016 06:40      Scheduled Meds: . cholecalciferol  1,000 Units Oral Daily  . cloNIDine  0.3 mg Oral BID  . diltiazem  240 mg Oral Daily  . enoxaparin (LOVENOX) injection  30 mg Subcutaneous Q24H  . furosemide  40 mg Oral BID  . hydrALAZINE  75 mg Oral TID  . lamoTRIgine  50 mg Oral Daily  . losartan  100 mg Oral Daily  . memantine  28 mg Oral Daily  . metoprolol tartrate  25 mg Oral BID  . potassium chloride SA  20 mEq Oral BID  . senna-docusate  1 tablet Oral QHS  . sertraline  100 mg Oral Daily   Continuous Infusions:    LOS: 1 day   Time spent: 20 minutes   Faye Ramsay, MD [facility] Pager [phone]  If 7PM-7AM, please contact night-coverage [email or web address] Password [email or web address] 08/21/2016, 2:01 PM

## 2016-08-21 NOTE — NC FL2 (Signed)
[place] LEVEL OF CARE SCREENING TOOL     IDENTIFICATION  Patient Name: Kaylee Turner Birthdate: June 22, 1931 Sex: female Admission Date (Current Location): 08/19/2016  [facility] and [place] Number:  Herbalist and Address:  [facility],  [place] [address] [place]      Provider Number: [id number]  Attending Physician Name and Address:  Theodis Blaze, MD  Relative Name and Phone Number:       Current Level of Care: Hospital Recommended Level of Care: Secured care memory care unit Prior Approval Number:    Date Approved/Denied:   PASRR Number:    Discharge Plan: Secured care memory care unit     Current Diagnoses: Patient Active Problem List   Diagnosis Date Noted  . Hypoxia 08/20/2016  . COPD (chronic obstructive pulmonary disease) with emphysema ([place]) 08/20/2016  . Frequent falls 08/20/2016  . Leukocytosis 08/20/2016  . Prolonged Q-T interval on ECG 08/20/2016  . Pressure injury of skin 08/20/2016  . Diabetes mellitus type 2 in obese ([place]) 12/31/2015  . Recurrent UTI 09/26/2015  . Depression with anxiety 09/26/2015  . Chronic diastolic heart failure ([place]) 08/04/2015  . Hypertensive heart and renal disease with congestive heart failure ([place]) 08/04/2015  . Diabetes mellitus, new onset ([place]) 06/04/2015  . Bilateral edema of lower extremity 06/04/2015  . Depression due to dementia 06/04/2015  . Hypertensive heart disease with heart failure ([place]) 02/11/2015  . Generalized anxiety disorder 02/11/2015  . Malignant neoplasm of female breast ([place]) 02/11/2015  . Dementia without behavioral disturbance 11/12/2014  . CHF, stage C ([place]) 11/12/2014  . Anxiety state 11/12/2014  . Essential hypertension 09/28/2014  . Chronic kidney disease (CKD) 09/28/2014  . PVD (peripheral vascular disease) ([place]) 09/28/2014  . Hypertensive heart failure ([place]) 09/28/2014  . Primary generalized (osteo)arthritis 09/28/2014  . Dementia, senile  09/28/2014  . Neoplasm of breast 09/28/2014  . Age-related nuclear cataract of left eye 09/28/2014  . Hypokalemia 10/01/2011    Orientation RESPIRATION BLADDER Height & Weight     Self  O2 (3L) Incontinent Weight: 166 lb 14.4 oz (75.7 kg) Height:  5\' 2"  (157.5 cm)  BEHAVIORAL SYMPTOMS/MOOD NEUROLOGICAL BOWEL NUTRITION STATUS  [place]   Incontinent Diet (regular)  AMBULATORY STATUS COMMUNICATION OF NEEDS Skin   Limited Assist Verbally PU Stage and Appropriate Care PU Stage 1 Dressing: No Dressing (Stage I -  Intact skin with non-blanchable redness of a localized area usually over a bony prominence.)                     Personal Care Assistance Level of Assistance  Bathing, Feeding, Dressing Bathing Assistance: Limited assistance Feeding assistance: Limited assistance Dressing Assistance: Limited assistance     Functional Limitations Info  Sight, Hearing, Speech Sight Info: Adequate Hearing Info: Adequate Speech Info: Adequate    SPECIAL CARE FACTORS FREQUENCY                       Contractures Contractures Info: Not present    Additional Factors Info  Code Status, Allergies Code Status Info: Full Code Allergies Info: Levaquin Levofloxacin In D5w, Penicillins, Latex           Current Medications (08/21/2016):  This is the current hospital active medication list Current Facility-Administered Medications  Medication Dose Route Frequency Provider Last Rate Last Dose  . acetaminophen (TYLENOL) tablet 500 mg  500 mg Oral Q4H PRN [place] Marisa Hua, MD      .  ALPRAZolam Duanne Moron) tablet 0.25 mg  0.25 mg Oral Q8H PRN Clanford Marisa Hua, MD   0.25 mg at 08/21/16 0253  . cholecalciferol (VITAMIN D) tablet 1,000 Units  1,000 Units Oral Daily Clanford Marisa Hua, MD   1,000 Units at 08/21/16 0949  . cloNIDine (CATAPRES) tablet 0.3 mg  0.3 mg Oral BID Clanford Marisa Hua, MD   0.3 mg at 08/21/16 0948  . diltiazem (CARDIZEM CD) 24 hr capsule 240 mg  240 mg Oral Daily  Clanford Marisa Hua, MD   240 mg at 08/21/16 0947  . enoxaparin (LOVENOX) injection 30 mg  30 mg Subcutaneous Q24H Clanford L Johnson, MD   30 mg at 08/21/16 1128  . furosemide (LASIX) tablet 40 mg  40 mg Oral BID Clanford Marisa Hua, MD   40 mg at 08/21/16 0813  . guaiFENesin-dextromethorphan (ROBITUSSIN DM) 100-10 MG/5ML syrup 5 mL  5 mL Oral Q6H PRN Clanford L Johnson, MD      . hydrALAZINE (APRESOLINE) tablet 75 mg  75 mg Oral TID Clanford Marisa Hua, MD   75 mg at 08/21/16 0949  . hydrOXYzine (ATARAX/VISTARIL) tablet 10 mg  10 mg Oral TID PRN Clanford Marisa Hua, MD   10 mg at 08/21/16 0311  . ipratropium-albuterol (DUONEB) 0.5-2.5 (3) MG/3ML nebulizer solution 3 mL  3 mL Nebulization Q6H PRN Clanford L Johnson, MD      . lamoTRIgine (LAMICTAL) tablet 50 mg  50 mg Oral Daily Clanford Marisa Hua, MD   50 mg at 08/21/16 0947  . losartan (COZAAR) tablet 100 mg  100 mg Oral Daily Clanford Marisa Hua, MD   100 mg at 08/21/16 0949  . magnesium hydroxide (MILK OF MAGNESIA) suspension 30 mL  30 mL Oral QHS PRN Clanford L Johnson, MD      . memantine (NAMENDA XR) 24 hr capsule 28 mg  28 mg Oral Daily Clanford Marisa Hua, MD   28 mg at 08/21/16 0949  . metoprolol tartrate (LOPRESSOR) tablet 25 mg  25 mg Oral BID Clanford Marisa Hua, MD   25 mg at 08/21/16 0949  . potassium chloride SA (K-DUR,KLOR-CON) CR tablet 20 mEq  20 mEq Oral BID Clanford Marisa Hua, MD   20 mEq at 08/21/16 0949  . senna-docusate (Senokot-S) tablet 1 tablet  1 tablet Oral QHS Clanford L Johnson, MD      . sertraline (ZOLOFT) tablet 100 mg  100 mg Oral Daily Clanford Marisa Hua, MD   100 mg at 08/21/16 [id number]     Discharge Medications: Please see discharge summary for a list of discharge medications.  Relevant Imaging Results:  Relevant Lab Results:   Additional Information Patient will require Home health at facility, conitnued PT services  Doniqua Saxby, Evie Lacks, [place]

## 2016-08-21 NOTE — Care Management Note (Addendum)
Case Management Note  Patient Details  Name: NATEESHA KORIN MRN: [id number] Date of Birth: 06/16/1931  Subjective/Objective: TC [facility] spoke to Tammy-they have their own HHPT-[facility],will fax HHPT order to [phone] when orders are put in. Per nsg patient qualifies for home 02-[facility] dme rep aware of home 02 need.Await documented 02 sats.MD notified of home 02 order needed.                   Action/Plan:d/c back to [facility]   Expected Discharge Date:                 Expected Discharge Plan:  [place] (ALF vs SNF)  In-House Referral:  Clinical Social Work  Discharge planning Services  CM Consult  Post Acute Care Choice:    Choice offered to:     DME Arranged:  Oxygen DME Agency:     HH Arranged:  PT HH Agency:   [name] has their own HHPT-[facility])  Status of Service:  Completed, signed off  If discussed at [place] of Stay Meetings, dates discussed:    Additional Comments:  Dessa Phi, RN 08/21/2016, 3:40 PM

## 2016-08-22 DIAGNOSIS — J439 Emphysema, unspecified: Secondary | ICD-10-CM | POA: Diagnosis not present

## 2016-08-22 DIAGNOSIS — R296 Repeated falls: Secondary | ICD-10-CM | POA: Diagnosis present

## 2016-08-22 DIAGNOSIS — N183 Chronic kidney disease, stage 3 (moderate): Secondary | ICD-10-CM | POA: Diagnosis present

## 2016-08-22 DIAGNOSIS — I959 Hypotension, unspecified: Secondary | ICD-10-CM | POA: Diagnosis present

## 2016-08-22 DIAGNOSIS — E860 Dehydration: Secondary | ICD-10-CM | POA: Diagnosis present

## 2016-08-22 DIAGNOSIS — E669 Obesity, unspecified: Secondary | ICD-10-CM | POA: Diagnosis present

## 2016-08-22 DIAGNOSIS — E269 Hyperaldosteronism, unspecified: Secondary | ICD-10-CM | POA: Diagnosis present

## 2016-08-22 DIAGNOSIS — M797 Fibromyalgia: Secondary | ICD-10-CM | POA: Diagnosis present

## 2016-08-22 DIAGNOSIS — Z6831 Body mass index (BMI) 31.0-31.9, adult: Secondary | ICD-10-CM | POA: Diagnosis not present

## 2016-08-22 DIAGNOSIS — W19XXXA Unspecified fall, initial encounter: Secondary | ICD-10-CM | POA: Diagnosis present

## 2016-08-22 DIAGNOSIS — I4581 Long QT syndrome: Secondary | ICD-10-CM | POA: Diagnosis present

## 2016-08-22 DIAGNOSIS — F0280 Dementia in other diseases classified elsewhere without behavioral disturbance: Secondary | ICD-10-CM | POA: Diagnosis present

## 2016-08-22 DIAGNOSIS — F419 Anxiety disorder, unspecified: Secondary | ICD-10-CM | POA: Diagnosis present

## 2016-08-22 DIAGNOSIS — Y92099 Unspecified place in other non-institutional residence as the place of occurrence of the external cause: Secondary | ICD-10-CM | POA: Diagnosis not present

## 2016-08-22 DIAGNOSIS — I160 Hypertensive urgency: Secondary | ICD-10-CM | POA: Diagnosis present

## 2016-08-22 DIAGNOSIS — I5032 Chronic diastolic (congestive) heart failure: Secondary | ICD-10-CM | POA: Diagnosis present

## 2016-08-22 DIAGNOSIS — E1122 Type 2 diabetes mellitus with diabetic chronic kidney disease: Secondary | ICD-10-CM | POA: Diagnosis present

## 2016-08-22 DIAGNOSIS — L309 Dermatitis, unspecified: Secondary | ICD-10-CM | POA: Diagnosis present

## 2016-08-22 DIAGNOSIS — J449 Chronic obstructive pulmonary disease, unspecified: Secondary | ICD-10-CM | POA: Diagnosis present

## 2016-08-22 DIAGNOSIS — R112 Nausea with vomiting, unspecified: Secondary | ICD-10-CM | POA: Diagnosis not present

## 2016-08-22 DIAGNOSIS — E876 Hypokalemia: Secondary | ICD-10-CM | POA: Diagnosis present

## 2016-08-22 DIAGNOSIS — S61210A Laceration without foreign body of right index finger without damage to nail, initial encounter: Secondary | ICD-10-CM | POA: Diagnosis present

## 2016-08-22 DIAGNOSIS — I739 Peripheral vascular disease, unspecified: Secondary | ICD-10-CM | POA: Diagnosis present

## 2016-08-22 DIAGNOSIS — D72829 Elevated white blood cell count, unspecified: Secondary | ICD-10-CM | POA: Diagnosis present

## 2016-08-22 DIAGNOSIS — I130 Hypertensive heart and chronic kidney disease with heart failure and stage 1 through stage 4 chronic kidney disease, or unspecified chronic kidney disease: Secondary | ICD-10-CM | POA: Diagnosis present

## 2016-08-22 DIAGNOSIS — R0902 Hypoxemia: Secondary | ICD-10-CM | POA: Diagnosis present

## 2016-08-22 DIAGNOSIS — G309 Alzheimer's disease, unspecified: Secondary | ICD-10-CM | POA: Diagnosis present

## 2016-08-22 LAB — CBC
HEMATOCRIT: 33 % — AB (ref 36.0–46.0)
Hemoglobin: 10.3 g/dL — ABNORMAL LOW (ref 12.0–15.0)
MCH: 28.5 pg (ref 26.0–34.0)
MCHC: 31.2 g/dL (ref 30.0–36.0)
MCV: 91.4 fL (ref 78.0–100.0)
PLATELETS: 274 10*3/uL (ref 150–400)
RBC: 3.61 MIL/uL — ABNORMAL LOW (ref 3.87–5.11)
RDW: 15.1 % (ref 11.5–15.5)
WBC: 10.9 10*3/uL — AB (ref 4.0–10.5)

## 2016-08-22 LAB — BASIC METABOLIC PANEL
ANION GAP: 8 (ref 5–15)
BUN: 28 mg/dL — ABNORMAL HIGH (ref 6–20)
CALCIUM: 8.4 mg/dL — AB (ref 8.9–10.3)
CO2: 27 mmol/L (ref 22–32)
Chloride: 108 mmol/L (ref 101–111)
Creatinine, Ser: 1.66 mg/dL — ABNORMAL HIGH (ref 0.44–1.00)
GFR, EST AFRICAN AMERICAN: 32 mL/min — AB (ref >60)
GFR, EST NON AFRICAN AMERICAN: 27 mL/min — AB (ref >60)
Glucose, Bld: 93 mg/dL (ref 65–99)
Potassium: 3.9 mmol/L (ref 3.5–5.1)
SODIUM: 143 mmol/L (ref 135–145)

## 2016-08-22 MED ORDER — SODIUM CHLORIDE 0.9 % IV BOLUS (SEPSIS)
250.0000 mL | Freq: Once | INTRAVENOUS | Status: AC
  Administered 2016-08-22: 250 mL via INTRAVENOUS

## 2016-08-22 MED ORDER — DIPHENHYDRAMINE HCL 25 MG PO CAPS
25.0000 mg | ORAL_CAPSULE | Freq: Four times a day (QID) | ORAL | Status: DC | PRN
  Administered 2016-08-22 – 2016-08-23 (×3): 25 mg via ORAL
  Filled 2016-08-22 (×4): qty 1

## 2016-08-22 MED ORDER — SODIUM CHLORIDE 0.9 % IV SOLN
INTRAVENOUS | Status: DC
  Administered 2016-08-22 – 2016-08-23 (×2): via INTRAVENOUS

## 2016-08-22 NOTE — Progress Notes (Signed)
Patient ID: Kaylee Turner, female   DOB: Oct 28, 1931, 82 y.o.   MRN: [id number]    PROGRESS NOTE    KENECIA KLENKE  [id number] DOB: July 17, 1931 DOA: 08/19/2016  PCP: Blanchie Serve, MD   Brief Narrative:   82 y.o. female with PMHx of dementia, fibromyalgia and HTN, who presented to the Emergency Department from [facility] for an evaluation s/p an unwitnessed fall that occurred on the evening prior to this admission.  She had recently been seen in ED several days ago for another unwitnessed fall and was sent back to facility after being worked up.   Pt has a bump to her central forehead and laceration on her right index finger. She denied pain to any area of her body.   Assessment & Plan:   1. Frequent unwitnessed falls at nursing facility - The patient has dementia and unable to explain the circumstances of these fall.   She has denied falling at all and only recalls the last episode and says she tripped. PT eval done and HH PT recommended at the facility. Plan to d/c on Monday as facility will not take over the weekend.  2. Hypoxia - suspect that this is secondary to her emphysema.  She does not appear to have a PE or pneumonia by CT chest findings.  Her hypoxia is rapidly improved and easily managed with supplemental oxygen by [place].  She will likely need oxygen on discharge.  3. Hypokalemia - supplemented and WNL this AM 4. Mild dehydration-gentle IV fluid hydration provided and pt looks more euvolemic this AM, stop IVF.  5. COPD/emphysema-dual nebs ordered for wheezing, oxygen by nasal cannula 2 L/m ordered. 6. CKD stage 3-creatinine appears to be stable from previous testing. Avoid nephrotoxic agents 7. Prolonged QTc on ECG-this seems to be a newer finding when looking at a older ECG tests. She is on some medications that can prolong QTC and we are monitoring closely.  8. Hypertensive chronic diastolic heart failure - most recent Echo found was from 2011 where she was noted  to have reasonably good systolic function but grade 1 diastolic heart dysfunction. 9. Diet controlled diabetes - carb modified heart healthy diet ordered. 10. Dementia-holding donepezil secondary to prolonged QTc. 31. Leukocytosis-no source of infection found on exam, likely reactive to recent fall, WBC trending down, repeat CBC in the morning. 12. Hypertensive urgency-resumed her home blood pressure medications, BP now very low. I think it is reasonable to hold her medications and add one at the time as clinically indicated. She is on Clonidine, Metoprolol, Cardizem, Lasix, Hydralazine, Losartan. I stopped all meds and will add one at the time.  13. Obesity - Body mass index is 30.53 kg/m.    DVT prophylaxis: Lovenox Sq Code Status: Full  Family Communication: Patient at bedside  Disposition Plan: Monday to her facility   Consultants:   None  Procedures:   None  Antimicrobials:   None   Subjective: No events overnight.   Objective: Vitals:   08/22/16 1334 08/22/16 1345 08/22/16 1420 08/22/16 1613  BP: (!) 68/48 (!) 79/40 (!) 106/39 (!) 112/49  Pulse: (!) 50     Resp: 20     Temp: 98.8 F (37.1 C)     TempSrc: Oral     SpO2: 92%     Weight:      Height:        Intake/Output Summary (Last 24 hours) at 08/22/16 1959 Last data filed at 08/22/16 1700  Gross per  24 hour  Intake           1667.5 ml  Output             1175 ml  Net            492.5 ml   Filed Weights   08/20/16 1102  Weight: 75.7 kg (166 lb 14.4 oz)    Examination:  General exam: Appears calm and comfortable  Respiratory system: Clear to auscultation. Respiratory effort normal. Cardiovascular system: S1 & S2 heard, RRR. No JVD, rubs, gallops or clicks. No pedal edema. Gastrointestinal system: Abdomen is nondistended, soft and nontender. No organomegaly or masses felt.   Data Reviewed: I have personally reviewed following labs and imaging studies  CBC:  Recent Labs Lab 08/20/16 0258  08/20/16 0303 08/21/16 0516 08/22/16 0455  WBC 13.6*  --  11.8* 10.9*  NEUTROABS 7.9*  --   --   --   HGB 12.0 12.6 10.8* 10.3*  HCT 37.9 37.0 35.4* 33.0*  MCV 92.2  --  94.1 91.4  PLT 315  --  [id number]   Basic Metabolic Panel:  Recent Labs Lab 08/20/16 0258 08/20/16 0303 08/21/16 0516 08/22/16 0455  NA  --  142 143 143  K  --  3.1* 3.9 3.9  CL  --  97* 108 108  CO2  --   --  30 27  GLUCOSE  --  109* 96 93  BUN  --  28* 27* 28*  CREATININE  --  1.50* 1.66* 1.66*  CALCIUM  --   --  8.8* 8.4*  MG 2.2  --   --   --    Urine analysis:    Component Value Date/Time   COLORURINE YELLOW 08/20/2016 0502   APPEARANCEUR CLEAR 08/20/2016 0502   LABSPEC 1.009 08/20/2016 0502   PHURINE 5.5 08/20/2016 0502   GLUCOSEU NEGATIVE 08/20/2016 0502   HGBUR NEGATIVE 08/20/2016 0502   BILIRUBINUR NEGATIVE 08/20/2016 0502   KETONESUR NEGATIVE 08/20/2016 0502   PROTEINUR NEGATIVE 08/20/2016 0502   UROBILINOGEN 0.2 09/30/2011 2349   NITRITE NEGATIVE 08/20/2016 0502   LEUKOCYTESUR NEGATIVE 08/20/2016 0502   Recent Results (from the past 240 hour(s))  MRSA PCR Screening     Status: None   Collection Time: 08/20/16 10:42 AM  Result Value Ref Range Status   MRSA by PCR NEGATIVE NEGATIVE Final    Comment:        The GeneXpert MRSA Assay (FDA approved for NASAL specimens only), is one component of a comprehensive MRSA colonization surveillance program. It is not intended to diagnose MRSA infection nor to guide or monitor treatment for MRSA infections.       Radiology Studies: No results found.    Scheduled Meds: . enoxaparin (LOVENOX) injection  30 mg Subcutaneous Q24H  . lamoTRIgine  50 mg Oral Daily  . memantine  28 mg Oral Daily  . potassium chloride SA  20 mEq Oral BID  . senna-docusate  1 tablet Oral QHS  . sertraline  100 mg Oral Daily   Continuous Infusions: . sodium chloride 75 mL/hr at 08/22/16 1530     LOS: 1 day   Time spent: 20 minutes   Faye Ramsay, MD [facility] Pager [phone]  If 7PM-7AM, please contact night-coverage [email or web address] Password [email or web address] 08/22/2016, 7:59 PM

## 2016-08-23 LAB — CBC
HCT: 31.2 % — ABNORMAL LOW (ref 36.0–46.0)
HEMOGLOBIN: 9.7 g/dL — AB (ref 12.0–15.0)
MCH: 28.3 pg (ref 26.0–34.0)
MCHC: 31.1 g/dL (ref 30.0–36.0)
MCV: 91 fL (ref 78.0–100.0)
Platelets: 235 10*3/uL (ref 150–400)
RBC: 3.43 MIL/uL — AB (ref 3.87–5.11)
RDW: 15.3 % (ref 11.5–15.5)
WBC: 9.5 10*3/uL (ref 4.0–10.5)

## 2016-08-23 LAB — BASIC METABOLIC PANEL
Anion gap: 4 — ABNORMAL LOW (ref 5–15)
BUN: 30 mg/dL — AB (ref 6–20)
CALCIUM: 8.7 mg/dL — AB (ref 8.9–10.3)
CHLORIDE: 109 mmol/L (ref 101–111)
CO2: 30 mmol/L (ref 22–32)
Creatinine, Ser: 1.66 mg/dL — ABNORMAL HIGH (ref 0.44–1.00)
GFR calc Af Amer: 32 mL/min — ABNORMAL LOW (ref >60)
GFR, EST NON AFRICAN AMERICAN: 27 mL/min — AB (ref >60)
GLUCOSE: 96 mg/dL (ref 65–99)
POTASSIUM: 4 mmol/L (ref 3.5–5.1)
SODIUM: 143 mmol/L (ref 135–145)

## 2016-08-23 MED ORDER — HYDRALAZINE HCL 20 MG/ML IJ SOLN
5.0000 mg | INTRAMUSCULAR | Status: DC | PRN
  Administered 2016-08-24 (×2): 5 mg via INTRAVENOUS
  Filled 2016-08-23 (×2): qty 1

## 2016-08-23 MED ORDER — HYDRALAZINE HCL 10 MG PO TABS
10.0000 mg | ORAL_TABLET | Freq: Three times a day (TID) | ORAL | Status: DC
  Administered 2016-08-23 – 2016-08-24 (×4): 10 mg via ORAL
  Filled 2016-08-23 (×4): qty 1

## 2016-08-23 NOTE — Progress Notes (Signed)
Patient ID: Kaylee Turner, female   DOB: 1931/05/08, 82 y.o.   MRN: [id number]    PROGRESS NOTE    Kaylee Turner  [id number] DOB: 1931-10-09 DOA: 08/19/2016  PCP: Blanchie Serve, MD   Brief Narrative:   82 y.o. female with PMHx of dementia, fibromyalgia and HTN, who presented to the Emergency Department from [facility] for an evaluation s/p an unwitnessed fall that occurred on the evening prior to this admission.  She had recently been seen in ED several days ago for another unwitnessed fall and was sent back to facility after being worked up.   Pt has a bump to her central forehead and laceration on her right index finger. She denied pain to any area of her body.   Assessment & Plan:   1. Frequent unwitnessed falls at nursing facility - The patient has dementia and unable to explain the circumstances of these fall.   She has denied falling at all and only recalls the last episode and says she tripped. PT eval done and HH PT recommended at the facility. Plan to d/c on Monday as facility will not take over the weekend. Pt is still rather somnolent this AM but she is easy to awake.  2. Hypoxia - suspect that this is secondary to her emphysema.  She does not appear to have a PE or pneumonia by CT chest findings.  Her hypoxia is rapidly improved and easily managed with supplemental oxygen by [place].  She will likely need oxygen on discharge.  3. Hypokalemia - supplemented and WNL this AM 4. Mild dehydration-gentle IV fluid hydration provided and pt looks more euvolemic this AM, tolerating diet well  5. COPD/emphysema-dual nebs ordered for wheezing, oxygen by nasal cannula 2 L/m ordered. 6. CKD stage 3-creatinine appears to be stable from previous testing. Avoid nephrotoxic agents, Cr remains 1.66 in the past 48 hours. 7. Prolonged QTc on ECG-this seems to be a newer finding when looking at a older ECG tests. She is on some medications that can prolong QTC and we are monitoring  closely.  8. Hypertensive chronic diastolic heart failure - most recent Echo found was from 2011 where she was noted to have reasonably good systolic function but grade 1 diastolic heart dysfunction. 9. Diet controlled diabetes - carb modified heart healthy diet ordered. 10. Dementia-holding donepezil secondary to prolonged QTc. 41. Leukocytosis-no source of infection found on exam, likely reactive to recent fall, WBC trending down and WNL this AM.  12. Hypertensive urgency-resumed her home blood pressure medications but then became hypotensive. I held her medications, Clonidine, Metoprolol, Cardizem, Lasix, Hydralazine, Losartan. BO better this AM, will add hydralazine 10 mg PO TID and as needed dosing.  13. Obesity - Body mass index is 30.53 kg/m.   DVT prophylaxis: Lovenox Sq Code Status: Full  Family Communication: Patient at bedside  Disposition Plan: Monday to her facility   Consultants:   None  Procedures:   None  Antimicrobials:   None   Subjective: No events overnight.   Objective: Vitals:   08/22/16 1420 08/22/16 1613 08/22/16 2100 08/23/16 0530  BP: (!) 106/39 (!) 112/49 (!) 117/49 (!) 169/55  Pulse:   (!) 50 60  Resp:   16 16  Temp:   98.5 F (36.9 C) 98.3 F (36.8 C)  TempSrc:   Oral Oral  SpO2:   99% 100%  Weight:      Height:        Intake/Output Summary (Last 24 hours) at  08/23/16 1203 Last data filed at 08/22/16 2300  Gross per 24 hour  Intake            372.5 ml  Output              200 ml  Net            172.5 ml   Filed Weights   08/20/16 1102  Weight: 75.7 kg (166 lb 14.4 oz)    Examination:  General exam: Appears calm and comfortable  Respiratory system: Clear to auscultation. Respiratory effort normal. Cardiovascular system: S1 & S2 heard, RRR. No JVD, rubs, gallops or clicks. No pedal edema. Gastrointestinal system: Abdomen is nondistended, soft and nontender. No organomegaly or masses felt.   Data Reviewed: I have personally  reviewed following labs and imaging studies  CBC:  Recent Labs Lab 08/20/16 0258 08/20/16 0303 08/21/16 0516 08/22/16 0455 08/23/16 0501  WBC 13.6*  --  11.8* 10.9* 9.5  NEUTROABS 7.9*  --   --   --   --   HGB 12.0 12.6 10.8* 10.3* 9.7*  HCT 37.9 37.0 35.4* 33.0* 31.2*  MCV 92.2  --  94.1 91.4 91.0  PLT 315  --  274 274 [id number]   Basic Metabolic Panel:  Recent Labs Lab 08/20/16 0258 08/20/16 0303 08/21/16 0516 08/22/16 0455 08/23/16 0501  NA  --  142 143 143 143  K  --  3.1* 3.9 3.9 4.0  CL  --  97* 108 108 109  CO2  --   --  30 27 30   GLUCOSE  --  109* 96 93 96  BUN  --  28* 27* 28* 30*  CREATININE  --  1.50* 1.66* 1.66* 1.66*  CALCIUM  --   --  8.8* 8.4* 8.7*  MG 2.2  --   --   --   --    Urine analysis:    Component Value Date/Time   COLORURINE YELLOW 08/20/2016 0502   APPEARANCEUR CLEAR 08/20/2016 0502   LABSPEC 1.009 08/20/2016 0502   PHURINE 5.5 08/20/2016 0502   GLUCOSEU NEGATIVE 08/20/2016 0502   HGBUR NEGATIVE 08/20/2016 0502   BILIRUBINUR NEGATIVE 08/20/2016 0502   KETONESUR NEGATIVE 08/20/2016 0502   PROTEINUR NEGATIVE 08/20/2016 0502   UROBILINOGEN 0.2 09/30/2011 2349   NITRITE NEGATIVE 08/20/2016 0502   LEUKOCYTESUR NEGATIVE 08/20/2016 0502   Recent Results (from the past 240 hour(s))  MRSA PCR Screening     Status: None   Collection Time: 08/20/16 10:42 AM  Result Value Ref Range Status   MRSA by PCR NEGATIVE NEGATIVE Final    Comment:        The GeneXpert MRSA Assay (FDA approved for NASAL specimens only), is one component of a comprehensive MRSA colonization surveillance program. It is not intended to diagnose MRSA infection nor to guide or monitor treatment for MRSA infections.       Radiology Studies: No results found.    Scheduled Meds: . enoxaparin (LOVENOX) injection  30 mg Subcutaneous Q24H  . hydrALAZINE  10 mg Oral Q8H  . lamoTRIgine  50 mg Oral Daily  . memantine  28 mg Oral Daily  . potassium chloride SA  20 mEq  Oral BID  . senna-docusate  1 tablet Oral QHS  . sertraline  100 mg Oral Daily   Continuous Infusions:     LOS: 2 days   Time spent: 20 minutes   Faye Ramsay, MD [facility] Pager [phone]  If 7PM-7AM, please contact night-coverage [email or web address]  Password [email or web address] 08/23/2016, 12:03 PM

## 2016-08-23 NOTE — Progress Notes (Signed)
Report from [place], Therapist, sports. Care assumed for pt at this time. Pt assisted to [facility] and then back to bed. Medicated w/ benadryl for itching. Bed alarm on. Assessment uchanged from AM assessment.

## 2016-08-23 NOTE — Progress Notes (Signed)
Per note, the recommendation at this time is for pt to return to his ALF with home health. Per chart, pt has a hx of dementia. SW reached out to nurse who states pt has been a little bit "fuzzy".  Tilda Burrow, MSW [phone]

## 2016-08-24 LAB — BASIC METABOLIC PANEL
Anion gap: 6 (ref 5–15)
BUN: 20 mg/dL (ref 6–20)
CALCIUM: 9.2 mg/dL (ref 8.9–10.3)
CHLORIDE: 109 mmol/L (ref 101–111)
CO2: 29 mmol/L (ref 22–32)
CREATININE: 1.23 mg/dL — AB (ref 0.44–1.00)
GFR calc non Af Amer: 39 mL/min — ABNORMAL LOW (ref >60)
GFR, EST AFRICAN AMERICAN: 46 mL/min — AB (ref >60)
GLUCOSE: 101 mg/dL — AB (ref 65–99)
Potassium: 4 mmol/L (ref 3.5–5.1)
Sodium: 144 mmol/L (ref 135–145)

## 2016-08-24 LAB — GLUCOSE, CAPILLARY
GLUCOSE-CAPILLARY: 117 mg/dL — AB (ref 65–99)
Glucose-Capillary: 92 mg/dL (ref 65–99)
Glucose-Capillary: 98 mg/dL (ref 65–99)

## 2016-08-24 LAB — CBC
HEMATOCRIT: 35.9 % — AB (ref 36.0–46.0)
HEMOGLOBIN: 11.2 g/dL — AB (ref 12.0–15.0)
MCH: 29.2 pg (ref 26.0–34.0)
MCHC: 31.2 g/dL (ref 30.0–36.0)
MCV: 93.5 fL (ref 78.0–100.0)
Platelets: 279 10*3/uL (ref 150–400)
RBC: 3.84 MIL/uL — ABNORMAL LOW (ref 3.87–5.11)
RDW: 15.1 % (ref 11.5–15.5)
WBC: 9.6 10*3/uL (ref 4.0–10.5)

## 2016-08-24 MED ORDER — HYDRALAZINE HCL 50 MG PO TABS
50.0000 mg | ORAL_TABLET | Freq: Three times a day (TID) | ORAL | Status: DC
  Administered 2016-08-24 – 2016-08-26 (×6): 50 mg via ORAL
  Filled 2016-08-24 (×6): qty 1

## 2016-08-24 MED ORDER — ENOXAPARIN SODIUM 40 MG/0.4ML ~~LOC~~ SOLN
40.0000 mg | SUBCUTANEOUS | Status: DC
  Administered 2016-08-24 – 2016-08-26 (×3): 40 mg via SUBCUTANEOUS
  Filled 2016-08-24 (×3): qty 0.4

## 2016-08-24 MED ORDER — DIPHENHYDRAMINE-ZINC ACETATE 2-0.1 % EX CREA
TOPICAL_CREAM | Freq: Three times a day (TID) | CUTANEOUS | Status: DC | PRN
  Administered 2016-08-24 – 2016-08-25 (×2): via TOPICAL
  Filled 2016-08-24: qty 28

## 2016-08-24 MED ORDER — METOPROLOL TARTRATE 25 MG PO TABS
25.0000 mg | ORAL_TABLET | Freq: Two times a day (BID) | ORAL | Status: DC
  Administered 2016-08-24 – 2016-08-25 (×3): 25 mg via ORAL
  Filled 2016-08-24 (×4): qty 1

## 2016-08-24 MED ORDER — LORATADINE 10 MG PO TABS
10.0000 mg | ORAL_TABLET | Freq: Every day | ORAL | Status: DC
  Administered 2016-08-24 – 2016-08-26 (×3): 10 mg via ORAL
  Filled 2016-08-24 (×3): qty 1

## 2016-08-24 MED ORDER — HYDROXYZINE HCL 25 MG PO TABS
25.0000 mg | ORAL_TABLET | Freq: Three times a day (TID) | ORAL | Status: DC | PRN
  Administered 2016-08-24 – 2016-08-26 (×3): 25 mg via ORAL
  Filled 2016-08-24 (×5): qty 1

## 2016-08-24 MED ORDER — IPRATROPIUM-ALBUTEROL 0.5-2.5 (3) MG/3ML IN SOLN
3.0000 mL | Freq: Four times a day (QID) | RESPIRATORY_TRACT | Status: DC
  Administered 2016-08-24: 3 mL via RESPIRATORY_TRACT
  Filled 2016-08-24: qty 3

## 2016-08-24 MED ORDER — ONDANSETRON HCL 4 MG/2ML IJ SOLN
4.0000 mg | Freq: Four times a day (QID) | INTRAMUSCULAR | Status: DC | PRN
  Administered 2016-08-24: 4 mg via INTRAVENOUS
  Filled 2016-08-24: qty 2

## 2016-08-24 MED ORDER — CLONIDINE HCL 0.2 MG PO TABS
0.3000 mg | ORAL_TABLET | Freq: Two times a day (BID) | ORAL | Status: DC
  Administered 2016-08-24 – 2016-08-26 (×4): 0.3 mg via ORAL
  Filled 2016-08-24 (×4): qty 1

## 2016-08-24 MED ORDER — PROCHLORPERAZINE EDISYLATE 5 MG/ML IJ SOLN: 10.0000 mg | INTRAMUSCULAR | Status: DC | PRN

## 2016-08-24 MED ORDER — LOSARTAN POTASSIUM 50 MG PO TABS: 100.0000 mg | ORAL_TABLET | Freq: Every day | ORAL | Status: DC

## 2016-08-24 MED ORDER — HYDROCORTISONE 1 % EX CREA
TOPICAL_CREAM | Freq: Three times a day (TID) | CUTANEOUS | Status: DC
  Administered 2016-08-24 – 2016-08-26 (×8): via TOPICAL
  Filled 2016-08-24: qty 28

## 2016-08-24 MED ORDER — HYDRALAZINE HCL 20 MG/ML IJ SOLN
10.0000 mg | INTRAMUSCULAR | Status: DC | PRN
  Administered 2016-08-24: 10 mg via INTRAVENOUS
  Filled 2016-08-24: qty 1

## 2016-08-24 MED ORDER — METOPROLOL TARTRATE 5 MG/5ML IV SOLN: 5.0000 mg | Freq: Four times a day (QID) | INTRAVENOUS | Status: DC | PRN

## 2016-08-24 NOTE — NC FL2 (Signed)
[place] LEVEL OF CARE SCREENING TOOL     IDENTIFICATION  Patient Name: Kaylee Turner Birthdate: November 29, 1930 Sex: female Admission Date (Current Location): 08/19/2016  [facility] and [place] Number:  Herbalist and Address:  [facility],  [place] [address], [place]      Provider Number: [id number]  Attending Physician Name and Address:  Theodis Blaze, MD  Relative Name and Phone Number:       Current Level of Care: Hospital Recommended Level of Care: Other (Comment) (Secured Locked Unit) Prior Approval Number:    Date Approved/Denied:   PASRR Number:    Discharge Plan: Other (Comment) (return to [facility])    Current Diagnoses: Patient Active Problem List   Diagnosis Date Noted  . Hypoxia 08/20/2016  . COPD (chronic obstructive pulmonary disease) with emphysema ([place]) 08/20/2016  . Frequent falls 08/20/2016  . Leukocytosis 08/20/2016  . Prolonged Q-T interval on ECG 08/20/2016  . Pressure injury of skin 08/20/2016  . Diabetes mellitus type 2 in obese ([place]) 12/31/2015  . Recurrent UTI 09/26/2015  . Depression with anxiety 09/26/2015  . Chronic diastolic heart failure ([place]) 08/04/2015  . Hypertensive heart and renal disease with congestive heart failure ([place]) 08/04/2015  . Diabetes mellitus, new onset ([place]) 06/04/2015  . Bilateral edema of lower extremity 06/04/2015  . Depression due to dementia 06/04/2015  . Hypertensive heart disease with heart failure ([place]) 02/11/2015  . Generalized anxiety disorder 02/11/2015  . Malignant neoplasm of female breast ([place]) 02/11/2015  . Dementia without behavioral disturbance 11/12/2014  . CHF, stage C ([place]) 11/12/2014  . Anxiety state 11/12/2014  . Essential hypertension 09/28/2014  . Chronic kidney disease (CKD) 09/28/2014  . PVD (peripheral vascular disease) ([place]) 09/28/2014  . Hypertensive heart failure ([place]) 09/28/2014  . Primary generalized (osteo)arthritis 09/28/2014  .  Dementia, senile 09/28/2014  . Neoplasm of breast 09/28/2014  . Age-related nuclear cataract of left eye 09/28/2014  . Hypokalemia 10/01/2011    Orientation RESPIRATION BLADDER Height & Weight     Self  O2 (2L) Incontinent Weight: 170 lb (77.1 kg) Height:  5\' 2"  (157.5 cm)  BEHAVIORAL SYMPTOMS/MOOD NEUROLOGICAL BOWEL NUTRITION STATUS  [place]   Incontinent Diet (regular/no change)  AMBULATORY STATUS COMMUNICATION OF NEEDS Skin   Supervision Verbally PU Stage and Appropriate Care (Stage I -  Intact skin with non-blanchable redness of a localized area usually over a bony prominence.) PU Stage 1 Dressing: No Dressing                     Personal Care Assistance Level of Assistance  Bathing, Feeding, Dressing Bathing Assistance: Limited assistance Feeding assistance: Independent Dressing Assistance: Limited assistance     Functional Limitations Info  Sight, Hearing, Speech Sight Info: Adequate Hearing Info: Adequate Speech Info: Adequate    SPECIAL CARE FACTORS FREQUENCY                       Contractures Contractures Info: Not present    Additional Factors Info  Code Status, Allergies Code Status Info: Full Code Allergies Info: Levaquin Levofloxacin In D5w, Penicillins, Latex           Current Medications (08/24/2016):  This is the current hospital active medication list Current Facility-Administered Medications  Medication Dose Route Frequency Provider Last Rate Last Dose  . acetaminophen (TYLENOL) tablet 500 mg  500 mg Oral Q4H PRN Clanford Marisa Hua, MD      .  ALPRAZolam Duanne Moron) tablet 0.25 mg  0.25 mg Oral Q8H PRN Clanford Marisa Hua, MD   0.25 mg at 08/21/16 1904  . diphenhydrAMINE-zinc acetate (BENADRYL) 2-0.1 % cream   Topical TID PRN Jeryl [place], NP      . enoxaparin (LOVENOX) injection 40 mg  40 mg Subcutaneous Q24H Theodis Blaze, MD      . guaiFENesin-dextromethorphan (ROBITUSSIN DM) 100-10 MG/5ML syrup 5 mL  5 mL Oral Q6H PRN Clanford L  Johnson, MD      . hydrALAZINE (APRESOLINE) injection 5 mg  5 mg Intravenous Q4H PRN Theodis Blaze, MD   5 mg at 08/24/16 0547  . hydrALAZINE (APRESOLINE) tablet 10 mg  10 mg Oral Q8H Theodis Blaze, MD   10 mg at 08/24/16 0536  . hydrocortisone cream 1 %   Topical TID Theodis Blaze, MD      . hydrOXYzine (ATARAX/VISTARIL) tablet 25 mg  25 mg Oral TID PRN Theodis Blaze, MD   25 mg at 08/24/16 1004  . ipratropium-albuterol (DUONEB) 0.5-2.5 (3) MG/3ML nebulizer solution 3 mL  3 mL Nebulization Q6H PRN Clanford L Johnson, MD      . ipratropium-albuterol (DUONEB) 0.5-2.5 (3) MG/3ML nebulizer solution 3 mL  3 mL Nebulization Q6H Theodis Blaze, MD      . lamoTRIgine (LAMICTAL) tablet 50 mg  50 mg Oral Daily Clanford Marisa Hua, MD   50 mg at 08/24/16 1004  . loratadine (CLARITIN) tablet 10 mg  10 mg Oral Daily Theodis Blaze, MD   10 mg at 08/24/16 1005  . magnesium hydroxide (MILK OF MAGNESIA) suspension 30 mL  30 mL Oral QHS PRN Clanford L Johnson, MD      . memantine (NAMENDA XR) 24 hr capsule 28 mg  28 mg Oral Daily Clanford Marisa Hua, MD   28 mg at 08/24/16 1004  . potassium chloride SA (K-DUR,KLOR-CON) CR tablet 20 mEq  20 mEq Oral BID Clanford Marisa Hua, MD   20 mEq at 08/24/16 1004  . senna-docusate (Senokot-S) tablet 1 tablet  1 tablet Oral QHS Clanford Marisa Hua, MD   1 tablet at 08/22/16 2021  . sertraline (ZOLOFT) tablet 100 mg  100 mg Oral Daily Clanford Marisa Hua, MD   100 mg at 08/24/16 1004     Discharge Medications: Please see discharge summary for a list of discharge medications.  Relevant Imaging Results:  Relevant Lab Results:   Additional Information Patient being recommended for home health at facility for continued PT services and new oxygen at DC.  [place], LCSW

## 2016-08-24 NOTE — Progress Notes (Signed)
Patient had an episode of emesis that appeared to be clear liquids.  Patient also refused to eat breakfast this morning, stating 'I just don't feel hungry'.  Dr. Doyle Askew aware; order for zofran received.  Patient comfortable now; will continue to monitor.

## 2016-08-24 NOTE — Progress Notes (Addendum)
LCSW discussed case with MD in which patient may be ready to dc this afternoon. Patient has onset of rash, medication has been given, but MD feels patient may still be stable to DC.  Once DC summary available, LCSW will send clinicals to facility for review. FL2 has been updated and need MD signature.  3:08 PM LCSW followed up with DC plan.  Currently patient not medically stable and family updated on care plan and barriers to DC.  Discussed if needed option of SNF in which daughter reports patient has been to [facility] in the past and if not medically stable to DC back to ALF or too weak, she would consent to placement at [facility].  Will follow and continue to assist with dc planning.  Following: Plan: return to ALF.  Lane Hacker, MSW Clinical Social Work: [employer] Coverage for :  [phone]

## 2016-08-24 NOTE — Progress Notes (Addendum)
Patient ID: Kaylee Turner, female   DOB: 1931/07/07, 82 y.o.   MRN: [id number]    PROGRESS NOTE    Kaylee Turner  [id number] DOB: July 16, 1931 DOA: 08/19/2016  PCP: Blanchie Serve, MD   Brief Narrative:   82 y.o. female with PMHx of dementia, fibromyalgia and HTN, who presented to the Emergency Department from [facility] for an evaluation s/p an unwitnessed fall that occurred on the evening prior to this admission.  She had recently been seen in ED several days ago for another unwitnessed fall and was sent back to facility after being worked up.   Pt has a bump to her central forehead and laceration on her right index finger. She denied pain to any area of her body.   Assessment & Plan:   1. Frequent unwitnessed falls at nursing facility - The patient has dementia and unable to explain the circumstances of these fall.   She has denied falling at all and only recalls the last episode and says she tripped. PT eval done and HH PT recommended at the facility. Plan to d/c on Monday as facility will not take over the weekend. Pt is still rather somnolent this AM but she is easy to awake.  2. Nausea and vomiting this AM - unclear etiology, avoid zofran due to prolonged QT interval, placed on compazine 3. Hypoxia - suspect that this is secondary to her emphysema.  She does not appear to have a PE or pneumonia by CT chest findings.  Her hypoxia is rapidly improved and easily managed with supplemental oxygen by [facility].  She will likely need oxygen on discharge.  4. Hypokalemia - supplemented and WNL this AM 5. Mild dehydration-gentle IV fluid hydration provided and pt looks more euvolemic this AM, tolerating diet well  6. COPD/emphysema-dual nebs ordered for wheezing, oxygen by nasal cannula 2 L/m ordered. 7. CKD stage 3-creatinine appears to be stable from previous testing. Avoid nephrotoxic agents, Cr remains 1.66 in the past 48 hours. 8. Diffuse rash - unclear etiology, placed on  Hydrocortisone cream and hydroxyzine as needed  9. Prolonged QTc on ECG-this seems to be a newer finding when looking at a older ECG tests. She is on some medications that can prolong QTC and we are monitoring closely.  10. Hypertensive chronic diastolic heart failure - most recent Echo found was from 2011 where she was noted to have reasonably good systolic function but grade 1 diastolic heart dysfunction. 11. Diet controlled diabetes - carb modified heart healthy diet ordered. 12. Dementia-holding donepezil secondary to prolonged QTc. 11. Leukocytosis-no source of infection found on exam, likely reactive to recent fall, WBC trending down and WNL this AM.  14. Hypertensive urgency-resumed her home blood pressure medications but then became hypotensive. I held her medications, Clonidine, Metoprolol, Cardizem, Lasix, Hydralazine, Losartan. BO better this AM, will add hydralazine 10 mg PO TID and as needed dosing.  15. Obesity - Body mass index is 31.09 kg/m.   DVT prophylaxis: Lovenox Sq Code Status: Full  Family Communication: Patient at bedside  Disposition Plan: in AM  Consultants:   None  Procedures:   None  Antimicrobials:   None   Subjective: No events overnight.   Objective: Vitals:   08/24/16 1328 08/24/16 1436 08/24/16 1444 08/24/16 1742  BP: (!) 202/76  (!) 199/77 (!) 183/72  Pulse: 75   (!) 101  Resp: 20   (!) 22  Temp: 99.3 F (37.4 C)   100.2 F (37.9 C)  TempSrc:  Oral   Rectal  SpO2: 98% 93% 98% 96%  Weight:      Height:        Intake/Output Summary (Last 24 hours) at 08/24/16 1846 Last data filed at 08/24/16 1130  Gross per 24 hour  Intake              220 ml  Output              850 ml  Net             -630 ml   Filed Weights   08/20/16 1102 08/24/16 0500  Weight: 75.7 kg (166 lb 14.4 oz) 77.1 kg (170 lb)    Examination:  General exam: Appears calm and comfortable  Respiratory system: Clear to auscultation. Respiratory effort  normal. Cardiovascular system: S1 & S2 heard, RRR. No JVD, rubs, gallops or clicks. No pedal edema. Gastrointestinal system: Abdomen is nondistended, soft and nontender. No organomegaly or masses felt.   Data Reviewed: I have personally reviewed following labs and imaging studies  CBC:  Recent Labs Lab 08/20/16 0258 08/20/16 0303 08/21/16 0516 08/22/16 0455 08/23/16 0501 08/24/16 0357  WBC 13.6*  --  11.8* 10.9* 9.5 9.6  NEUTROABS 7.9*  --   --   --   --   --   HGB 12.0 12.6 10.8* 10.3* 9.7* 11.2*  HCT 37.9 37.0 35.4* 33.0* 31.2* 35.9*  MCV 92.2  --  94.1 91.4 91.0 93.5  PLT 315  --  274 274 235 [id number]   Basic Metabolic Panel:  Recent Labs Lab 08/20/16 0258 08/20/16 0303 08/21/16 0516 08/22/16 0455 08/23/16 0501 08/24/16 0357  NA  --  142 143 143 143 144  K  --  3.1* 3.9 3.9 4.0 4.0  CL  --  97* 108 108 109 109  CO2  --   --  30 27 30 29   GLUCOSE  --  109* 96 93 96 101*  BUN  --  28* 27* 28* 30* 20  CREATININE  --  1.50* 1.66* 1.66* 1.66* 1.23*  CALCIUM  --   --  8.8* 8.4* 8.7* 9.2  MG 2.2  --   --   --   --   --    Urine analysis:    Component Value Date/Time   COLORURINE YELLOW 08/20/2016 0502   APPEARANCEUR CLEAR 08/20/2016 0502   LABSPEC 1.009 08/20/2016 0502   PHURINE 5.5 08/20/2016 0502   GLUCOSEU NEGATIVE 08/20/2016 0502   HGBUR NEGATIVE 08/20/2016 0502   BILIRUBINUR NEGATIVE 08/20/2016 0502   KETONESUR NEGATIVE 08/20/2016 0502   PROTEINUR NEGATIVE 08/20/2016 0502   UROBILINOGEN 0.2 09/30/2011 2349   NITRITE NEGATIVE 08/20/2016 0502   LEUKOCYTESUR NEGATIVE 08/20/2016 0502   Recent Results (from the past 240 hour(s))  MRSA PCR Screening     Status: None   Collection Time: 08/20/16 10:42 AM  Result Value Ref Range Status   MRSA by PCR NEGATIVE NEGATIVE Final    Comment:        The GeneXpert MRSA Assay (FDA approved for NASAL specimens only), is one component of a comprehensive MRSA colonization surveillance program. It is not intended to  diagnose MRSA infection nor to guide or monitor treatment for MRSA infections.       Radiology Studies: No results found.    Scheduled Meds: . cloNIDine  0.3 mg Oral BID  . enoxaparin (LOVENOX) injection  40 mg Subcutaneous Q24H  . hydrALAZINE  50 mg Oral Q8H  . hydrocortisone cream  Topical TID  . ipratropium-albuterol  3 mL Nebulization Q6H  . lamoTRIgine  50 mg Oral Daily  . loratadine  10 mg Oral Daily  . memantine  28 mg Oral Daily  . potassium chloride SA  20 mEq Oral BID  . senna-docusate  1 tablet Oral QHS  . sertraline  100 mg Oral Daily   Continuous Infusions:     LOS: 3 days   Time spent: 20 minutes   Faye Ramsay, MD [facility] Pager [phone]  If 7PM-7AM, please contact night-coverage [email or web address] Password [facility] 08/24/2016, 6:46 PM

## 2016-08-24 NOTE — Care Management Note (Signed)
Case Management Note  Patient Details  Name: BLAYKE DAM MRN: [id number] Date of Birth: 1931/06/25  Subjective/Objective: If d/c plan to return back to ALF-will need home 02 sats checked, & documented-Nsg aware. [facility] dme rep already following if needed.CSW following for ALF/SNF                   Action/Plan:d/c plan ALF   Expected Discharge Date:               Expected Discharge Plan:  [place] (ALF vs SNF)  In-House Referral:  Clinical Social Work  Discharge planning Services  CM Consult  Post Acute Care Choice:    Choice offered to:     DME Arranged:  Oxygen DME Agency:     HH Arranged:  PT HH Agency:   [name] own [facility])  Status of Service:  Completed, signed off  If discussed at [employer] of Stay Meetings, dates discussed:    Additional Comments:  Dessa Phi, RN 08/24/2016, 3:35 PM

## 2016-08-24 NOTE — Progress Notes (Signed)
PT Cancellation Note  Patient Details Name: Kaylee Turner MRN: [id number] DOB: 09-29-1931   Cancelled Treatment:    Reason Eval/Treat Not Completed: Medical issues which prohibited therapy. Spoke with RN who reports pt's BP is elevated. Will hold PT at this time.    Weston Anna, MPT Pager: [phone]

## 2016-08-24 NOTE — Progress Notes (Signed)
Patient has a generalized rash that she has been scratching until it has bled.  Rash is generalized, pink and red, small dots all over the body but mostly concentrated on the torso.  Dr. Doyle Askew is aware and has placed orders for new medication.  Will administer patient's medication and continue to monitor.

## 2016-08-25 ENCOUNTER — Inpatient Hospital Stay (HOSPITAL_COMMUNITY): Payer: Medicare Other

## 2016-08-25 LAB — CBC
HCT: 33.8 % — ABNORMAL LOW (ref 36.0–46.0)
Hemoglobin: 10.5 g/dL — ABNORMAL LOW (ref 12.0–15.0)
MCH: 29.2 pg (ref 26.0–34.0)
MCHC: 31.1 g/dL (ref 30.0–36.0)
MCV: 94.2 fL (ref 78.0–100.0)
PLATELETS: 266 10*3/uL (ref 150–400)
RBC: 3.59 MIL/uL — AB (ref 3.87–5.11)
RDW: 15.2 % (ref 11.5–15.5)
WBC: 9.1 10*3/uL (ref 4.0–10.5)

## 2016-08-25 LAB — BASIC METABOLIC PANEL
Anion gap: 3 — ABNORMAL LOW (ref 5–15)
BUN: 17 mg/dL (ref 6–20)
CO2: 31 mmol/L (ref 22–32)
CREATININE: 1.34 mg/dL — AB (ref 0.44–1.00)
Calcium: 9 mg/dL (ref 8.9–10.3)
Chloride: 108 mmol/L (ref 101–111)
GFR, EST AFRICAN AMERICAN: 41 mL/min — AB (ref >60)
GFR, EST NON AFRICAN AMERICAN: 36 mL/min — AB (ref >60)
Glucose, Bld: 101 mg/dL — ABNORMAL HIGH (ref 65–99)
Potassium: 4.2 mmol/L (ref 3.5–5.1)
SODIUM: 142 mmol/L (ref 135–145)

## 2016-08-25 NOTE — Care Management Note (Signed)
Case Management Note  Patient Details  Name: Kaylee Turner MRN: [id number] Date of Birth: 01-08-1931  Subjective/Objective: d/c back to ALF-[facility]-they have their own HHPT[facility]-to be added to fl2-CSW aware. May need home 02-Nsg to document 02 sats. [facility] dme rep Jermaine aware & following for qualification, & home 02 order if needed.                   Action/Plan:d/c back to ALF   Expected Discharge Date:                 Expected Discharge Plan:  [place] (ALF vs SNF)  In-House Referral:  Clinical Social Work  Discharge planning Services  CM Consult  Post Acute Care Choice:    Choice offered to:     DME Arranged:  Oxygen DME Agency:     HH Arranged:  PT HH Agency:   [name] their own [facility])  Status of Service:  Completed, signed off  If discussed at [facility] of Stay Meetings, dates discussed:    Additional Comments:  Dessa Phi, RN 08/25/2016, 11:27 AM

## 2016-08-25 NOTE — Progress Notes (Signed)
Patient ID: Kaylee Turner, female   DOB: March 08, 1931, 82 y.o.   MRN: [id number]    PROGRESS NOTE    Kaylee Turner  [id number] DOB: Feb 10, 1931 DOA: 08/19/2016  PCP: Blanchie Serve, MD   Brief Narrative:   82 y.o. female with PMHx of dementia, fibromyalgia and HTN, who presented to the Emergency Department from [facility] for an evaluation s/p an unwitnessed fall that occurred on the evening prior to this admission.  She had recently been seen in ED several days ago for another unwitnessed fall and was sent back to facility after being worked up.   Pt has a bump to her central forehead and laceration on her right index finger. She denied pain to any area of her body.   Assessment & Plan:   1. Frequent unwitnessed falls at nursing facility - The patient has dementia and unable to explain the circumstances of these fall.   She has denied falling at all and only recalls the last episode and says she tripped. PT eval done and HH PT recommended at the facility. Plan to d/c on Monday as facility will not take over the weekend. Pt is still rather somnolent this AM but she is easy to awake.  2. Nausea and vomiting this AM - unclear etiology, avoid zofran due to prolonged QT interval, placed on compazine to see if this will help. If persistent vomiting, plan for getting abd xray and may need NGT 3. Hypoxia - suspect that this is secondary to her emphysema, COPD. More dyspnea this AM. Will ask for CXR this AM as pt also with low grade temp 100 F. Must rule out developing PNA. She will likely need oxygen on discharge.  4. Hypokalemia - supplemented and WNL this AM 5. Mild dehydration-gentle IV fluid hydration provided and pt looks more euvolemic this AM, not eating well  6. CKD stage 3-creatinine appears to be stable from previous testing. Avoid nephrotoxic agents, Cr down to 1.2 - 1.3 in the past 24 hours.  7. Diffuse rash - unclear etiology, placed on Hydrocortisone cream and hydroxyzine  as needed. Somewhat better.  8. Prolonged QTc on ECG-this seems to be a newer finding when looking at a older ECG tests. She is on some medications that can prolong QTC and we are monitoring closely.  9. Hypertensive chronic diastolic heart failure - most recent Echo found was from 2011 where she was noted to have reasonably good systolic function but grade 1 diastolic heart dysfunction. 10. Diet controlled diabetes - carb modified heart healthy diet ordered. 11. Dementia-holding donepezil secondary to prolonged QTc. 35. Leukocytosis-no source of infection found on exam, likely reactive to recent fall, WBC trending down and WNL this AM.  13. Hypertensive urgency-resumed her home blood pressure medications but then became hypotensive. I held her medications, Clonidine, Metoprolol, Cardizem, Lasix, Hydralazine, Losartan. BP very labile and careful administration of BP meds needed. Currently on hydralazine, metoprolol, clonidine  14. Obesity - Body mass index is 31.09 kg/m.   DVT prophylaxis: Lovenox Sq Code Status: Full  Family Communication: Patient at bedside  Disposition Plan: possibly in am if CXR clear and no fevers, no vomiting   Consultants:   None  Procedures:   None  Antimicrobials:   None   Subjective: No events overnight. Still with vomiting this AM and more hypoxia and dyspnea.   Objective: Vitals:   08/24/16 2057 08/25/16 0440 08/25/16 1255 08/25/16 1324  BP: (!) 171/67 (!) 120/53  (!) 101/46  Pulse:  96 (!) 56  (!) 55  Resp: 20 20  16   Temp: 99.3 F (37.4 C) 98.6 F (37 C)  98.4 F (36.9 C)  TempSrc: Oral Oral  Oral  SpO2: 97% 96% 98% 96%  Weight:      Height:       No intake or output data in the 24 hours ending 08/25/16 1709 Filed Weights   08/20/16 1102 08/24/16 0500  Weight: 75.7 kg (166 lb 14.4 oz) 77.1 kg (170 lb)    Examination:  General exam: Appears in mild distress due to dyspnea, diffuse macular rash Respiratory system: Course breath  sounds bilaterally and diminished at bases  Cardiovascular system: S1 & S2 heard, RRR. No JVD, rubs, gallops or clicks. No pedal edema. Gastrointestinal system: Abdomen is nondistended, soft and nontender. No organomegaly or masses felt.   Data Reviewed: I have personally reviewed following labs and imaging studies  CBC:  Recent Labs Lab 08/20/16 0258  08/21/16 0516 08/22/16 0455 08/23/16 0501 08/24/16 0357 08/25/16 0517  WBC 13.6*  --  11.8* 10.9* 9.5 9.6 9.1  NEUTROABS 7.9*  --   --   --   --   --   --   HGB 12.0  < > 10.8* 10.3* 9.7* 11.2* 10.5*  HCT 37.9  < > 35.4* 33.0* 31.2* 35.9* 33.8*  MCV 92.2  --  94.1 91.4 91.0 93.5 94.2  PLT 315  --  274 274 235 279 266  < > = values in this interval not displayed. Basic Metabolic Panel:  Recent Labs Lab 08/20/16 0258  08/21/16 0516 08/22/16 0455 08/23/16 0501 08/24/16 0357 08/25/16 0517  NA  --   < > 143 143 143 144 142  K  --   < > 3.9 3.9 4.0 4.0 4.2  CL  --   < > 108 108 109 109 108  CO2  --   --  30 27 30 29 31   GLUCOSE  --   < > 96 93 96 101* 101*  BUN  --   < > 27* 28* 30* 20 17  CREATININE  --   < > 1.66* 1.66* 1.66* 1.23* 1.34*  CALCIUM  --   --  8.8* 8.4* 8.7* 9.2 9.0  MG 2.2  --   --   --   --   --   --   < > = values in this interval not displayed. Urine analysis:    Component Value Date/Time   COLORURINE YELLOW 08/20/2016 0502   APPEARANCEUR CLEAR 08/20/2016 0502   LABSPEC 1.009 08/20/2016 0502   PHURINE 5.5 08/20/2016 0502   GLUCOSEU NEGATIVE 08/20/2016 0502   HGBUR NEGATIVE 08/20/2016 0502   BILIRUBINUR NEGATIVE 08/20/2016 0502   KETONESUR NEGATIVE 08/20/2016 0502   PROTEINUR NEGATIVE 08/20/2016 0502   UROBILINOGEN 0.2 09/30/2011 2349   NITRITE NEGATIVE 08/20/2016 0502   LEUKOCYTESUR NEGATIVE 08/20/2016 0502   Recent Results (from the past 240 hour(s))  MRSA PCR Screening     Status: None   Collection Time: 08/20/16 10:42 AM  Result Value Ref Range Status   MRSA by PCR NEGATIVE NEGATIVE Final      Comment:        The GeneXpert MRSA Assay (FDA approved for NASAL specimens only), is one component of a comprehensive MRSA colonization surveillance program. It is not intended to diagnose MRSA infection nor to guide or monitor treatment for MRSA infections.       Radiology Studies: Dg Chest 2 View  Result  Date: 08/25/2016 CLINICAL DATA:  Fever EXAM: CHEST  2 VIEW COMPARISON:  08/20/2016 FINDINGS: There is cardiomegaly. Bibasilar atelectasis. Trace bilateral effusions. No overt edema. No acute bony abnormality. IMPRESSION: Cardiomegaly with bibasilar atelectasis and trace effusions. Electronically Signed   By: Rolm Baptise M.D.   On: 08/25/2016 12:16      Scheduled Meds: . cloNIDine  0.3 mg Oral BID  . enoxaparin (LOVENOX) injection  40 mg Subcutaneous Q24H  . hydrALAZINE  50 mg Oral Q8H  . hydrocortisone cream   Topical TID  . lamoTRIgine  50 mg Oral Daily  . loratadine  10 mg Oral Daily  . memantine  28 mg Oral Daily  . metoprolol tartrate  25 mg Oral BID  . potassium chloride SA  20 mEq Oral BID  . senna-docusate  1 tablet Oral QHS  . sertraline  100 mg Oral Daily   Continuous Infusions:     LOS: 3 days   Time spent: 20 minutes   Faye Ramsay, MD [facility] Pager [phone]  If 7PM-7AM, please contact night-coverage [email or web address] Password [email or web address] 08/25/2016, 5:09 PM

## 2016-08-25 NOTE — Progress Notes (Signed)
SATURATION QUALIFICATIONS: (This note is used to comply with regulatory documentation for home oxygen)  Patient Saturations on Room Air at Rest = 96%  Patient Saturations on Room Air while Ambulating = 94%  Patient Saturations on Liters of oxygen while Ambulating = %  Please briefly explain why patient needs home oxygen: 

## 2016-08-25 NOTE — Progress Notes (Signed)
Noted no home 02 needed for return back to [facility] HHPT(facilities own HHC)await HHPT(can be added on fl2). CSW to manage ALF.

## 2016-08-26 DIAGNOSIS — E876 Hypokalemia: Secondary | ICD-10-CM

## 2016-08-26 DIAGNOSIS — N183 Chronic kidney disease, stage 3 (moderate): Secondary | ICD-10-CM

## 2016-08-26 DIAGNOSIS — I10 Essential (primary) hypertension: Secondary | ICD-10-CM

## 2016-08-26 DIAGNOSIS — J439 Emphysema, unspecified: Secondary | ICD-10-CM

## 2016-08-26 DIAGNOSIS — R296 Repeated falls: Secondary | ICD-10-CM

## 2016-08-26 DIAGNOSIS — I5032 Chronic diastolic (congestive) heart failure: Secondary | ICD-10-CM

## 2016-08-26 DIAGNOSIS — I110 Hypertensive heart disease with heart failure: Secondary | ICD-10-CM

## 2016-08-26 DIAGNOSIS — R0902 Hypoxemia: Principal | ICD-10-CM

## 2016-08-26 LAB — CBC
HEMATOCRIT: 31.6 % — AB (ref 36.0–46.0)
Hemoglobin: 9.9 g/dL — ABNORMAL LOW (ref 12.0–15.0)
MCH: 28.4 pg (ref 26.0–34.0)
MCHC: 31.3 g/dL (ref 30.0–36.0)
MCV: 90.8 fL (ref 78.0–100.0)
Platelets: 247 10*3/uL (ref 150–400)
RBC: 3.48 MIL/uL — ABNORMAL LOW (ref 3.87–5.11)
RDW: 15.1 % (ref 11.5–15.5)
WBC: 8.2 10*3/uL (ref 4.0–10.5)

## 2016-08-26 LAB — BASIC METABOLIC PANEL
Anion gap: 4 — ABNORMAL LOW (ref 5–15)
BUN: 30 mg/dL — AB (ref 6–20)
CALCIUM: 9.2 mg/dL (ref 8.9–10.3)
CO2: 32 mmol/L (ref 22–32)
CREATININE: 1.75 mg/dL — AB (ref 0.44–1.00)
Chloride: 104 mmol/L (ref 101–111)
GFR calc non Af Amer: 26 mL/min — ABNORMAL LOW (ref >60)
GFR, EST AFRICAN AMERICAN: 30 mL/min — AB (ref >60)
GLUCOSE: 107 mg/dL — AB (ref 65–99)
Potassium: 4.9 mmol/L (ref 3.5–5.1)
Sodium: 140 mmol/L (ref 135–145)

## 2016-08-26 MED ORDER — HYDRALAZINE HCL 25 MG PO TABS: 50.0000 mg | ORAL_TABLET | Freq: Three times a day (TID) | ORAL | Status: DC

## 2016-08-26 MED ORDER — HYDROCORTISONE 1 % EX CREA: TOPICAL_CREAM | Freq: Three times a day (TID) | CUTANEOUS | 0 refills | Status: AC

## 2016-08-26 MED ORDER — METOPROLOL SUCCINATE ER 25 MG PO TB24: 25.0000 mg | ORAL_TABLET | Freq: Every day | ORAL | Status: DC

## 2016-08-26 MED ORDER — METOPROLOL SUCCINATE ER 25 MG PO TB24: 25.0000 mg | ORAL_TABLET | Freq: Every day | ORAL | 1 refills | Status: AC

## 2016-08-26 MED ORDER — FUROSEMIDE 20 MG PO TABS: 20.0000 mg | ORAL_TABLET | Freq: Every day | ORAL | Status: DC

## 2016-08-26 MED ORDER — ENOXAPARIN SODIUM 30 MG/0.3ML ~~LOC~~ SOLN: 30.0000 mg | SUBCUTANEOUS | Status: DC

## 2016-08-26 MED ORDER — IPRATROPIUM-ALBUTEROL 20-100 MCG/ACT IN AERS: 1.0000 | INHALATION_SPRAY | Freq: Four times a day (QID) | RESPIRATORY_TRACT | 0 refills | Status: AC | PRN

## 2016-08-26 MED ORDER — DIPHENHYDRAMINE-ZINC ACETATE 2-0.1 % EX CREA: TOPICAL_CREAM | Freq: Three times a day (TID) | CUTANEOUS | 0 refills | Status: AC | PRN

## 2016-08-26 MED ORDER — HYDROCODONE-ACETAMINOPHEN 5-325 MG PO TABS: 1.0000 | ORAL_TABLET | Freq: Four times a day (QID) | ORAL | 0 refills | Status: DC | PRN

## 2016-08-26 NOTE — NC FL2 (Signed)
[place] LEVEL OF CARE SCREENING TOOL     IDENTIFICATION  Patient Name: Kaylee Turner Birthdate: 03/02/1931 Sex: female Admission Date (Current Location): 08/19/2016  [facility] and [place] Number:  Herbalist and Address:  [facility],  [place] [address], [place]      Provider Number: [id number]  Attending Physician Name and Address:  Barton Dubois, MD  Relative Name and Phone Number:       Current Level of Care: Hospital Recommended Level of Care: Other (Comment) (Secured Locked Unit) Prior Approval Number:    Date Approved/Denied:   PASRR Number:    Discharge Plan: Other (Comment) (return to secured locked unit    Current Diagnoses: Patient Active Problem List   Diagnosis Date Noted  . Hypoxia 08/20/2016  . COPD (chronic obstructive pulmonary disease) with emphysema ([place]) 08/20/2016  . Frequent falls 08/20/2016  . Leukocytosis 08/20/2016  . Prolonged Q-T interval on ECG 08/20/2016  . Pressure injury of skin 08/20/2016  . Diabetes mellitus type 2 in obese ([place]) 12/31/2015  . Recurrent UTI 09/26/2015  . Depression with anxiety 09/26/2015  . Chronic diastolic heart failure ([place]) 08/04/2015  . Hypertensive heart and renal disease with congestive heart failure ([place]) 08/04/2015  . Diabetes mellitus, new onset ([place]) 06/04/2015  . Bilateral edema of lower extremity 06/04/2015  . Depression due to dementia 06/04/2015  . Hypertensive heart disease with heart failure ([place]) 02/11/2015  . Generalized anxiety disorder 02/11/2015  . Malignant neoplasm of female breast ([place]) 02/11/2015  . Dementia without behavioral disturbance 11/12/2014  . CHF, stage C ([place]) 11/12/2014  . Anxiety state 11/12/2014  . Essential hypertension 09/28/2014  . Chronic kidney disease (CKD) 09/28/2014  . PVD (peripheral vascular disease) ([place]) 09/28/2014  . Hypertensive heart failure ([place]) 09/28/2014  . Primary generalized (osteo)arthritis 09/28/2014   . Dementia, senile 09/28/2014  . Neoplasm of breast 09/28/2014  . Age-related nuclear cataract of left eye 09/28/2014  . Hypokalemia 10/01/2011    Orientation RESPIRATION BLADDER Height & Weight     Self  Normal Incontinent Weight: 170 lb (77.1 kg) Height:  5\' 2"  (157.5 cm)  BEHAVIORAL SYMPTOMS/MOOD NEUROLOGICAL BOWEL NUTRITION STATUS  Wanderer   Incontinent Diet (regular/no change)  AMBULATORY STATUS COMMUNICATION OF NEEDS Skin   Supervision Verbally PU Stage and Appropriate Care (Stage I -  Intact skin with non-blanchable redness of a localized area usually over a bony prominence.) PU Stage 1 Dressing: No Dressing                     Personal Care Assistance Level of Assistance  Bathing, Feeding, Dressing Bathing Assistance: Limited assistance Feeding assistance: Independent Dressing Assistance: Limited assistance     Functional Limitations Info  Sight, Hearing, Speech Sight Info: Adequate Hearing Info: Adequate Speech Info: Adequate    SPECIAL CARE FACTORS FREQUENCY                       Contractures Contractures Info: Not present    Additional Factors Info  Code Status, Allergies Code Status Info: Full Code Allergies Info: Levaquin Levofloxacin In D5w, Penicillins, Latex           Current Medications (08/26/2016):  This is the current hospital active medication list Current Facility-Administered Medications  Medication Dose Route Frequency Provider Last Rate Last Dose  . acetaminophen (TYLENOL) tablet 500 mg  500 mg Oral Q4H PRN Clanford Marisa Hua, MD      . ALPRAZolam (  XANAX) tablet 0.25 mg  0.25 mg Oral Q8H PRN Clanford Marisa Hua, MD   0.25 mg at 08/21/16 1904  . cloNIDine (CATAPRES) tablet 0.3 mg  0.3 mg Oral BID Theodis Blaze, MD   0.3 mg at 08/26/16 0829  . diphenhydrAMINE-zinc acetate (BENADRYL) 2-0.1 % cream   Topical TID PRN Jeryl Columbia, NP      . Derrill Memo ON 08/27/2016] enoxaparin (LOVENOX) injection 30 mg  30 mg Subcutaneous Q24H  Barton Dubois, MD      . guaiFENesin-dextromethorphan (ROBITUSSIN DM) 100-10 MG/5ML syrup 5 mL  5 mL Oral Q6H PRN Clanford L Johnson, MD      . hydrALAZINE (APRESOLINE) injection 10 mg  10 mg Intravenous Q4H PRN Theodis Blaze, MD   10 mg at 08/24/16 1829  . hydrALAZINE (APRESOLINE) tablet 50 mg  50 mg Oral Q8H Theodis Blaze, MD   50 mg at 08/26/16 1523  . hydrocortisone cream 1 %   Topical TID Theodis Blaze, MD      . hydrOXYzine (ATARAX/VISTARIL) tablet 25 mg  25 mg Oral TID PRN Theodis Blaze, MD   25 mg at 08/25/16 2258  . ipratropium-albuterol (DUONEB) 0.5-2.5 (3) MG/3ML nebulizer solution 3 mL  3 mL Nebulization Q6H PRN Clanford L Johnson, MD      . lamoTRIgine (LAMICTAL) tablet 50 mg  50 mg Oral Daily Clanford Marisa Hua, MD   50 mg at 08/26/16 0829  . loratadine (CLARITIN) tablet 10 mg  10 mg Oral Daily Theodis Blaze, MD   10 mg at 08/26/16 0829  . magnesium hydroxide (MILK OF MAGNESIA) suspension 30 mL  30 mL Oral QHS PRN Clanford L Johnson, MD      . memantine (NAMENDA XR) 24 hr capsule 28 mg  28 mg Oral Daily Clanford Marisa Hua, MD   28 mg at 08/26/16 1218  . metoprolol (LOPRESSOR) injection 5 mg  5 mg Intravenous Q6H PRN Theodis Blaze, MD      . metoprolol succinate (TOPROL-XL) 24 hr tablet 25 mg  25 mg Oral QHS Barton Dubois, MD      . metoprolol tartrate (LOPRESSOR) tablet 25 mg  25 mg Oral BID Theodis Blaze, MD   25 mg at 08/25/16 2253  . potassium chloride SA (K-DUR,KLOR-CON) CR tablet 20 mEq  20 mEq Oral BID Clanford Marisa Hua, MD   20 mEq at 08/26/16 0830  . prochlorperazine (COMPAZINE) injection 10 mg  10 mg Intravenous Q4H PRN Theodis Blaze, MD      . senna-docusate (Senokot-S) tablet 1 tablet  1 tablet Oral QHS Clanford Marisa Hua, MD   1 tablet at 08/25/16 2253  . sertraline (ZOLOFT) tablet 100 mg  100 mg Oral Daily Clanford Marisa Hua, MD   100 mg at 08/26/16 [id number]     Discharge Medications:  Current Discharge Medication List        START taking these medications    Details  diphenhydrAMINE-zinc acetate (BENADRYL) cream Apply topically 3 (three) times daily as needed for itching. Qty: 28.4 g, Refills: 0    HYDROcodone-acetaminophen (NORCO/VICODIN) 5-325 MG tablet Take 1 tablet by mouth every 6 (six) hours as needed. Qty: 15 tablet, Refills: 0    hydrocortisone cream 1 % Apply topically 3 (three) times daily. Qty: 30 g, Refills: 0    Ipratropium-Albuterol (COMBIVENT RESPIMAT) 20-100 MCG/ACT AERS respimat Inhale 1 puff into the lungs every 6 (six) hours as needed for wheezing or shortness of breath. Qty: 1  Inhaler, Refills: 0    metoprolol succinate (TOPROL-XL) 25 MG 24 hr tablet Take 1 tablet (25 mg total) by mouth at bedtime. Qty: 30 tablet, Refills: 1          CONTINUE these medications which have CHANGED   Details  furosemide (LASIX) 20 MG tablet Take 1 tablet (20 mg total) by mouth daily.    hydrALAZINE (APRESOLINE) 25 MG tablet Take 2 tablets (50 mg total) by mouth 3 (three) times daily.          CONTINUE these medications which have NOT CHANGED   Details  acetaminophen (TYLENOL) 500 MG tablet Take 500 mg by mouth every 4 (four) hours as needed for mild pain, fever or headache. DO NOT EXCEED 4 GM OF TYLENOL IN 24 HOURS    ALPRAZolam (XANAX) 0.25 MG tablet Take 0.25 mg by mouth every 8 (eight) hours as needed for anxiety.     alum & mag hydroxide-simeth (MINTOX) 200-200-20 MG/5ML suspension Take 30 mLs by mouth as needed for indigestion or heartburn. No more than 4 doses per 24 hours    Calcium Carb-Cholecalciferol (OYSTER SHELL CALCIUM 500+D PO) Take 1 tablet by mouth daily.     Cholecalciferol (VITAMIN D-3) 1000 UNITS CAPS Take 1,000 Units by mouth daily.     cloNIDine (CATAPRES) 0.3 MG tablet Take 0.3 mg by mouth 2 (two) times daily.    guaifenesin (ROBITUSSIN) 100 MG/5ML syrup Take 200 mg by mouth every 6 (six) hours as needed for cough.    lamoTRIgine (LAMICTAL) 25 MG tablet Take 50 mg by mouth daily.     loperamide (IMODIUM) 2 MG capsule Take 2 mg by mouth as needed for diarrhea or loose stools. No more than 8 doses in 24 hrs    magnesium hydroxide (MILK OF MAGNESIA) 400 MG/5ML suspension Take 30 mLs by mouth at bedtime as needed for mild constipation.    Memantine HCl-Donepezil HCl (NAMZARIC) 28-10 MG CP24 Take by mouth daily.    Neomycin-Bacitracin-Polymyxin (TRIPLE ANTIBIOTIC) 3.5-6695611466 OINT Apply 1 application topically as needed (wound care).    potassium chloride SA (K-DUR,KLOR-CON) 20 MEQ tablet Take 20 mEq by mouth 2 (two) times daily.    sennosides-docusate sodium (SENOKOT-S) 8.6-50 MG tablet Take 1 tablet by mouth at bedtime.    sertraline (ZOLOFT) 100 MG tablet Take 100 mg by mouth daily.          STOP taking these medications     diltiazem (DILACOR XR) 240 MG 24 hr capsule      losartan (COZAAR) 100 MG tablet      metoprolol tartrate (LOPRESSOR) 25 MG tablet            Relevant Imaging Results:  Relevant Lab Results:   Additional Information Patient being recommended for home health at facility for continued PT services.  Lia Hopping, LCSW

## 2016-08-26 NOTE — Progress Notes (Signed)
Spoke with Crystal and made changes to to [facility] to fit ALF request. Facility Crystal has approved FL2 therefore patient can return. Evening ED CSW to assist with transport.

## 2016-08-26 NOTE — Care Management Important Message (Signed)
Important Message  Patient Details  Name: LATRELL HASELEY MRN: [id number] Date of Birth: [birth year]   Medicare Important Message Given:  Yes    Camillo Flaming 08/26/2016, 10:20 AMImportant Message  Patient Details  Name: EARLYNE FERDERER MRN: [id number] Date of Birth: 09/14/1931   Medicare Important Message Given:  Yes    Camillo Flaming 08/26/2016, 10:20 AM

## 2016-08-26 NOTE — Progress Notes (Signed)
Physical Therapy Treatment Patient [place] Name: Kaylee Turner MRN: [id number] DOB: 1930/12/16 Today's Date: 08/26/2016    History of Present Illness 81 yo female admitted with frequent fall, hypoxia. Hx of dementia, fibromyalgia, CKD, HTN, falls. Pt is from an ALF    PT Comments    The  Patient is  Able to ambulate short distances. Was WC mobility PTA mostly due to falls. Plans  To return to ALF w/ HHPT.  Follow Up Recommendations  Home health PT (at ALF)     Equipment Recommendations  None recommended by PT    Recommendations for Other Services       Precautions / Restrictions Precautions Precautions: Fall Precaution Comments: monitor oxygen sats    Mobility  Bed Mobility   Bed Mobility: Supine to Sit     Supine to sit: Supervision     General bed mobility comments: close guard for safety.   Transfers Overall transfer level: Needs assistance Equipment used: Rolling walker (2 wheeled) Transfers: Sit to/from [facility] Sit to Stand: Min guard Stand pivot transfers: Min guard       General transfer comment: cues for safety  to transfer to [facility] and to bed. patient holds onto the [facility] armrest.   Ambulation/Gait Ambulation/Gait assistance: Min assist Ambulation Distance (Feet): 50 Feet Assistive device: Rolling walker (2 wheeled) Gait Pattern/deviations: Step-through pattern;Drifts right/left     General Gait Details: cues for safety, distance from walker. Assist to stabilize.    Stairs            Wheelchair Mobility    Modified Rankin (Stroke Patients Only)       Balance Overall balance assessment: History of Falls;Needs assistance Sitting-balance support: No upper extremity supported;Feet supported Sitting balance-Leahy Scale: Fair     Standing balance support: During functional activity;Bilateral upper extremity supported Standing balance-Leahy Scale: Poor                      Cognition Arousal/Alertness:  Awake/alert Behavior During Therapy: WFL for tasks assessed/performed Overall Cognitive Status: History of cognitive impairments - at baseline                      Exercises      General Comments        Pertinent Vitals/Pain Faces Pain Scale: No hurt    Home Living                      Prior Function            PT Goals (current goals can now be found in the care plan section) Progress towards PT goals: Progressing toward goals    Frequency    Min 3X/week      PT Plan Current plan remains appropriate    Co-evaluation             End of Session Equipment Utilized During Treatment: Gait belt Activity Tolerance: Patient tolerated treatment well Patient left: in bed;with call bell/phone within reach;with bed alarm set     Time: 1125-1149 PT Time Calculation (min) (ACUTE ONLY): 24 min  Charges:  $Gait Training: 8-22 mins $Self Care/Home Management: 8-22                    G Codes:      Kaylee Turner 08/26/2016, 1:37 PM Kaylee Turner PT [phone]

## 2016-08-26 NOTE — NC FL2 (Deleted)
[place] LEVEL OF CARE SCREENING TOOL     IDENTIFICATION  Patient Name: TASHELL ASLAM Birthdate: Feb 18, 1931 Sex: female Admission Date (Current Location): 08/19/2016  [facility] and [place] Number:  Herbalist and Address:  [facility],  [place] [address] [place]      Provider Number: [id number]  Attending Physician Name and Address:  Barton Dubois, MD  Relative Name and Phone Number:       Current Level of Care: Hospital Recommended Level of Care: Other (Comment) (Secured Locked Unit) Prior Approval Number:    Date Approved/Denied:   PASRR Number:    Discharge Plan: Other (Comment) (return to [employer]    Current Diagnoses: Patient Active Problem List   Diagnosis Date Noted  . Hypoxia 08/20/2016  . COPD (chronic obstructive pulmonary disease) with emphysema ([place]) 08/20/2016  . Frequent falls 08/20/2016  . Leukocytosis 08/20/2016  . Prolonged Q-T interval on ECG 08/20/2016  . Pressure injury of skin 08/20/2016  . Diabetes mellitus type 2 in obese ([place]) 12/31/2015  . Recurrent UTI 09/26/2015  . Depression with anxiety 09/26/2015  . Chronic diastolic heart failure ([place]) 08/04/2015  . 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 Diabetes mellitus, new onset ([place]) 06/04/2015  . Bilateral edema of lower extremity 06/04/2015  . Depression due to dementia 06/04/2015  . Hypertensive heart disease with heart failure ([place]) 02/11/2015  . Generalized anxiety disorder 02/11/2015  . Malignant neoplasm of female breast ([place]) 02/11/2015  . Dementia without behavioral disturbance 11/12/2014  . CHF, stage C ([place]) 11/12/2014  . Anxiety state 11/12/2014  . Essential hypertension 09/28/2014  . Chronic kidney disease (CKD) 09/28/2014  . PVD (peripheral vascular disease) ([place]) 09/28/2014  . Hypertensive heart failure ([place]) 09/28/2014  . Primary generalized (osteo)arthritis 09/28/2014   . Dementia, senile 09/28/2014  . Neoplasm of breast 09/28/2014  . Age-related nuclear cataract of left eye 09/28/2014  . Hypokalemia 10/01/2011    Orientation RESPIRATION BLADDER Height & Weight     Self  Normal Incontinent Weight: 170 lb (77.1 kg) Height:  5\' 2"  (157.5 cm)  BEHAVIORAL SYMPTOMS/MOOD NEUROLOGICAL BOWEL NUTRITION STATUS  Wanderer   Incontinent Diet (regular/no change)  AMBULATORY STATUS COMMUNICATION OF NEEDS Skin   Supervision Verbally PU Stage and Appropriate Care (Stage I -  Intact skin with non-blanchable redness of a localized area usually over a bony prominence.) PU Stage 1 Dressing: No Dressing                     Personal Care Assistance Level of Assistance  Bathing, Feeding, Dressing Bathing Assistance: Limited assistance Feeding assistance: Independent Dressing Assistance: Limited assistance     Functional Limitations Info  Sight, Hearing, Speech Sight Info: Adequate Hearing Info: Adequate Speech Info: Adequate    SPECIAL CARE FACTORS FREQUENCY                       Contractures Contractures Info: Not present    Additional Factors Info  Code Status, Allergies Code Status Info: Full Code Allergies Info: Levaquin Levofloxacin In D5w, Penicillins, Latex           Current Medications (08/26/2016):  This is the current hospital active medication list Current Facility-Administered Medications  Medication Dose Route Frequency Provider Last Rate Last Dose  . acetaminophen (TYLENOL) tablet 500 mg  500 mg Oral Q4H PRN Clanford Marisa Hua, MD      . ALPRAZolam (  XANAX) tablet 0.25 mg  0.25 mg Oral Q8H PRN Clanford Marisa Hua, MD   0.25 mg at 08/21/16 1904  . cloNIDine (CATAPRES) tablet 0.3 mg  0.3 mg Oral BID Theodis Blaze, MD   0.3 mg at 08/26/16 0829  . diphenhydrAMINE-zinc acetate (BENADRYL) 2-0.1 % cream   Topical TID PRN Jeryl Columbia, NP      . Derrill Memo ON 08/27/2016] enoxaparin (LOVENOX) injection 30 mg  30 mg Subcutaneous Q24H  Barton Dubois, MD      . guaiFENesin-dextromethorphan (ROBITUSSIN DM) 100-10 MG/5ML syrup 5 mL  5 mL Oral Q6H PRN Clanford L Johnson, MD      . hydrALAZINE (APRESOLINE) injection 10 mg  10 mg Intravenous Q4H PRN Theodis Blaze, MD   10 mg at 08/24/16 1829  . hydrALAZINE (APRESOLINE) tablet 50 mg  50 mg Oral Q8H Theodis Blaze, MD   50 mg at 08/26/16 1523  . hydrocortisone cream 1 %   Topical TID Theodis Blaze, MD      . hydrOXYzine (ATARAX/VISTARIL) tablet 25 mg  25 mg Oral TID PRN Theodis Blaze, MD   25 mg at 08/25/16 2258  . ipratropium-albuterol (DUONEB) 0.5-2.5 (3) MG/3ML nebulizer solution 3 mL  3 mL Nebulization Q6H PRN Clanford L Johnson, MD      . lamoTRIgine (LAMICTAL) tablet 50 mg  50 mg Oral Daily Clanford Marisa Hua, MD   50 mg at 08/26/16 0829  . loratadine (CLARITIN) tablet 10 mg  10 mg Oral Daily Theodis Blaze, MD   10 mg at 08/26/16 0829  . magnesium hydroxide (MILK OF MAGNESIA) suspension 30 mL  30 mL Oral QHS PRN Clanford L Johnson, MD      . memantine (NAMENDA XR) 24 hr capsule 28 mg  28 mg Oral Daily Clanford Marisa Hua, MD   28 mg at 08/26/16 1218  . metoprolol (LOPRESSOR) injection 5 mg  5 mg Intravenous Q6H PRN Theodis Blaze, MD      . metoprolol succinate (TOPROL-XL) 24 hr tablet 25 mg  25 mg Oral QHS Barton Dubois, MD      . metoprolol tartrate (LOPRESSOR) tablet 25 mg  25 mg Oral BID Theodis Blaze, MD   25 mg at 08/25/16 2253  . potassium chloride SA (K-DUR,KLOR-CON) CR tablet 20 mEq  20 mEq Oral BID Clanford Marisa Hua, MD   20 mEq at 08/26/16 0830  . prochlorperazine (COMPAZINE) injection 10 mg  10 mg Intravenous Q4H PRN Theodis Blaze, MD      . senna-docusate (Senokot-S) tablet 1 tablet  1 tablet Oral QHS Clanford Marisa Hua, MD   1 tablet at 08/25/16 2253  . sertraline (ZOLOFT) tablet 100 mg  100 mg Oral Daily Clanford Marisa Hua, MD   100 mg at 08/26/16 [id number]     Discharge Medications: Please see discharge summary for a list of discharge medications.  Relevant Imaging  Results:  Relevant Lab Results:   Additional Information Patient being recommended for home health at facility for continued PT services.  Lia Hopping, LCSW

## 2016-08-26 NOTE — Progress Notes (Signed)
CSW spoke with patients sister, [name], regarding the DNR on chart. Patients sister is POA and had the conversation with patients PCP in August 2017. Patients daughter would like our records to be updated to reflect DNR as well.   Kingsley Spittle, [place] Clinical Social Worker [phone]

## 2016-08-26 NOTE — Progress Notes (Signed)
Patient is set to discharge to [facility] today. Patient & sister, [name], aware. Discharge packet given to RN, Kamma. [facility] called for transport.   Kingsley Spittle, [place] Clinical Social Worker [phone]

## 2016-08-26 NOTE — NC FL2 (Deleted)
[place] LEVEL OF CARE SCREENING TOOL     IDENTIFICATION  Patient Name: Kaylee Turner Birthdate: 1931/08/16 Sex: female Admission Date (Current Location): 08/19/2016  [facility] and [place] Number:  Herbalist and Address:  [facility],  [place] [address] [place]      Provider Number: [id number]  Attending Physician Name and Address:  Barton Dubois, MD  Relative Name and Phone Number:       Current Level of Care: Hospital Recommended Level of Care: Other (Comment) (Secured Locked Unit) Prior Approval Number:    Date Approved/Denied:   PASRR Number:    Discharge Plan: Other (Comment) (return to [facility])    Current Diagnoses: Patient Active Problem List   Diagnosis Date Noted  . Hypoxia 08/20/2016  . COPD (chronic obstructive pulmonary disease) with emphysema ([place]) 08/20/2016  . Frequent falls 08/20/2016  . Leukocytosis 08/20/2016  . Prolonged Q-T interval on ECG 08/20/2016  . Pressure injury of skin 08/20/2016  . Diabetes mellitus type 2 in obese ([place]) 12/31/2015  . Recurrent UTI 09/26/2015  . Depression with anxiety 09/26/2015  . Chronic diastolic heart failure ([place]) 08/04/2015  . Hypertensive heart and renal disease with congestive heart failure ([place]) 08/04/2015  . Diabetes mellitus, new onset ([place]) 06/04/2015  . Bilateral edema of lower extremity 06/04/2015  . Depression due to dementia 06/04/2015  . Hypertensive heart disease with heart failure ([place]) 02/11/2015  . Generalized anxiety disorder 02/11/2015  . Malignant neoplasm of female breast ([place]) 02/11/2015  . Dementia without behavioral disturbance 11/12/2014  . CHF, stage C ([place]) 11/12/2014  . Anxiety state 11/12/2014  . Essential hypertension 09/28/2014  . Chronic kidney disease (CKD) 09/28/2014  . PVD (peripheral vascular disease) ([place]) 09/28/2014  . Hypertensive heart failure ([place]) 09/28/2014  . Primary generalized (osteo)arthritis 09/28/2014  .  Dementia, senile 09/28/2014  . Neoplasm of breast 09/28/2014  . Age-related nuclear cataract of left eye 09/28/2014  . Hypokalemia 10/01/2011    Orientation RESPIRATION BLADDER Height & Weight     Self  O2 (2L) Incontinent Weight: 170 lb (77.1 kg) Height:  5\' 2"  (157.5 cm)  BEHAVIORAL SYMPTOMS/MOOD NEUROLOGICAL BOWEL NUTRITION STATUS  [place]   Incontinent Diet (regular/no change)  AMBULATORY STATUS COMMUNICATION OF NEEDS Skin   Supervision Verbally PU Stage and Appropriate Care (Stage I -  Intact skin with non-blanchable redness of a localized area usually over a bony prominence.) PU Stage 1 Dressing: No Dressing                     Personal Care Assistance Level of Assistance  Bathing, Feeding, Dressing Bathing Assistance: Limited assistance Feeding assistance: Independent Dressing Assistance: Limited assistance     Functional Limitations Info  Sight, Hearing, Speech Sight Info: Adequate Hearing Info: Adequate Speech Info: Adequate    SPECIAL CARE FACTORS FREQUENCY                       Contractures Contractures Info: Not present    Additional Factors Info  Code Status, Allergies Code Status Info: Full Code Allergies Info: Levaquin Levofloxacin In D5w, Penicillins, Latex           Current Medications (08/26/2016):  This is the current hospital active medication list Current Facility-Administered Medications  Medication Dose Route Frequency Provider Last Rate Last Dose  . acetaminophen (TYLENOL) tablet 500 mg  500 mg Oral Q4H PRN Clanford Marisa Hua, MD      . ALPRAZolam (  XANAX) tablet 0.25 mg  0.25 mg Oral Q8H PRN Clanford Marisa Hua, MD   0.25 mg at 08/21/16 1904  . cloNIDine (CATAPRES) tablet 0.3 mg  0.3 mg Oral BID Theodis Blaze, MD   0.3 mg at 08/26/16 0829  . diphenhydrAMINE-zinc acetate (BENADRYL) 2-0.1 % cream   Topical TID PRN Rhetta Mura Schorr, NP      . enoxaparin (LOVENOX) injection 40 mg  40 mg Subcutaneous Q24H Theodis Blaze, MD   40 mg at  08/26/16 1218  . guaiFENesin-dextromethorphan (ROBITUSSIN DM) 100-10 MG/5ML syrup 5 mL  5 mL Oral Q6H PRN Clanford L Johnson, MD      . hydrALAZINE (APRESOLINE) injection 10 mg  10 mg Intravenous Q4H PRN Theodis Blaze, MD   10 mg at 08/24/16 1829  . hydrALAZINE (APRESOLINE) tablet 50 mg  50 mg Oral Q8H Theodis Blaze, MD   50 mg at 08/26/16 [id number]  . hydrocortisone cream 1 %   Topical TID Theodis Blaze, MD      . hydrOXYzine (ATARAX/VISTARIL) tablet 25 mg  25 mg Oral TID PRN Theodis Blaze, MD   25 mg at 08/25/16 2258  . ipratropium-albuterol (DUONEB) 0.5-2.5 (3) MG/3ML nebulizer solution 3 mL  3 mL Nebulization Q6H PRN Clanford L Johnson, MD      . lamoTRIgine (LAMICTAL) tablet 50 mg  50 mg Oral Daily Clanford Marisa Hua, MD   50 mg at 08/26/16 0829  . loratadine (CLARITIN) tablet 10 mg  10 mg Oral Daily Theodis Blaze, MD   10 mg at 08/26/16 0829  . magnesium hydroxide (MILK OF MAGNESIA) suspension 30 mL  30 mL Oral QHS PRN Clanford L Johnson, MD      . memantine (NAMENDA XR) 24 hr capsule 28 mg  28 mg Oral Daily Clanford Marisa Hua, MD   28 mg at 08/26/16 1218  . metoprolol (LOPRESSOR) injection 5 mg  5 mg Intravenous Q6H PRN Theodis Blaze, MD      . metoprolol succinate (TOPROL-XL) 24 hr tablet 25 mg  25 mg Oral QHS Barton Dubois, MD      . metoprolol tartrate (LOPRESSOR) tablet 25 mg  25 mg Oral BID Theodis Blaze, MD   25 mg at 08/25/16 2253  . potassium chloride SA (K-DUR,KLOR-CON) CR tablet 20 mEq  20 mEq Oral BID Clanford Marisa Hua, MD   20 mEq at 08/26/16 0830  . prochlorperazine (COMPAZINE) injection 10 mg  10 mg Intravenous Q4H PRN Theodis Blaze, MD      . senna-docusate (Senokot-S) tablet 1 tablet  1 tablet Oral QHS Clanford Marisa Hua, MD   1 tablet at 08/25/16 2253  . sertraline (ZOLOFT) tablet 100 mg  100 mg Oral Daily Clanford Marisa Hua, MD   100 mg at 08/26/16 [id number]     Discharge Medications: Current Discharge Medication List        START taking these medications   Details   diphenhydrAMINE-zinc acetate (BENADRYL) cream Apply topically 3 (three) times daily as needed for itching. Qty: 28.4 g, Refills: 0    HYDROcodone-acetaminophen (NORCO/VICODIN) 5-325 MG tablet Take 1 tablet by mouth every 6 (six) hours as needed. Qty: 15 tablet, Refills: 0    hydrocortisone cream 1 % Apply topically 3 (three) times daily. Qty: 30 g, Refills: 0    Ipratropium-Albuterol (COMBIVENT RESPIMAT) 20-100 MCG/ACT AERS respimat Inhale 1 puff into the lungs every 6 (six) hours as needed for wheezing or shortness of breath. Qty: 1  Inhaler, Refills: 0    metoprolol succinate (TOPROL-XL) 25 MG 24 hr tablet Take 1 tablet (25 mg total) by mouth at bedtime. Qty: 30 tablet, Refills: 1          CONTINUE these medications which have CHANGED   Details  furosemide (LASIX) 20 MG tablet Take 1 tablet (20 mg total) by mouth daily.    hydrALAZINE (APRESOLINE) 25 MG tablet Take 2 tablets (50 mg total) by mouth 3 (three) times daily.          CONTINUE these medications which have NOT CHANGED   Details  acetaminophen (TYLENOL) 500 MG tablet Take 500 mg by mouth every 4 (four) hours as needed for mild pain, fever or headache. DO NOT EXCEED 4 GM OF TYLENOL IN 24 HOURS    ALPRAZolam (XANAX) 0.25 MG tablet Take 0.25 mg by mouth every 8 (eight) hours as needed for anxiety.     alum & mag hydroxide-simeth (MINTOX) 200-200-20 MG/5ML suspension Take 30 mLs by mouth as needed for indigestion or heartburn. No more than 4 doses per 24 hours    Calcium Carb-Cholecalciferol (OYSTER SHELL CALCIUM 500+D PO) Take 1 tablet by mouth daily.     Cholecalciferol (VITAMIN D-3) 1000 UNITS CAPS Take 1,000 Units by mouth daily.     cloNIDine (CATAPRES) 0.3 MG tablet Take 0.3 mg by mouth 2 (two) times daily.    guaifenesin (ROBITUSSIN) 100 MG/5ML syrup Take 200 mg by mouth every 6 (six) hours as needed for cough.    lamoTRIgine (LAMICTAL) 25 MG tablet Take 50 mg by mouth daily.    loperamide  (IMODIUM) 2 MG capsule Take 2 mg by mouth as needed for diarrhea or loose stools. No more than 8 doses in 24 hrs    magnesium hydroxide (MILK OF MAGNESIA) 400 MG/5ML suspension Take 30 mLs by mouth at bedtime as needed for mild constipation.    Memantine HCl-Donepezil HCl (NAMZARIC) 28-10 MG CP24 Take by mouth daily.    Neomycin-Bacitracin-Polymyxin (TRIPLE ANTIBIOTIC) [phone] OINT Apply 1 application topically as needed (wound care).    potassium chloride SA (K-DUR,KLOR-CON) 20 MEQ tablet Take 20 mEq by mouth 2 (two) times daily.    sennosides-docusate sodium (SENOKOT-S) 8.6-50 MG tablet Take 1 tablet by mouth at bedtime.    sertraline (ZOLOFT) 100 MG tablet Take 100 mg by mouth daily.          STOP taking these medications     diltiazem (DILACOR XR) 240 MG 24 hr capsule      losartan (COZAAR) 100 MG tablet      metoprolol tartrate (LOPRESSOR) 25 MG tablet           Relevant Imaging Results:  Relevant Lab Results:   Additional Information Patient being recommended for home health at facility for continued PT services and new oxygen at DC.  Lia Hopping, LCSW

## 2016-08-26 NOTE — Discharge Summary (Signed)
Physician Discharge Summary  Kaylee Turner [id number] DOB: [birth year] DOA: 08/19/2016  PCP: Blanchie Serve, MD  Admit date: 08/19/2016 Discharge date: 08/26/2016  Time spent: 35 minutes  Recommendations for Outpatient Follow-up:  1. Repeat BMET in 5 days to follow electrolytes and renal function  2. Please reassess BP and adjust antihypertensive regimen as needed    Discharge Diagnoses:  Principal Problem:   Hypoxia Active Problems:   Hypokalemia   Essential hypertension   Chronic kidney disease (CKD)   PVD (peripheral vascular disease) (HCC)   Hypertensive heart failure (HCC)   Primary generalized (osteo)arthritis   Dementia, senile   Diabetes mellitus, new onset (HCC)   Bilateral edema of lower extremity   Chronic diastolic heart failure (HCC)   COPD (chronic obstructive pulmonary disease) with emphysema (HCC)   Frequent falls   Leukocytosis   Prolonged Q-T interval on ECG   Pressure injury of skin   Discharge Condition: stable and improved. Will discharge back to ALF with [facility] services. Follow up appointment with PCP in 10 days.  Diet recommendation: heart healthy diet (low sodium, less than 2 gram daily) and modified carbohydrates diet.  Filed Weights   08/20/16 1102 08/24/16 0500  Weight: 75.7 kg (166 lb 14.4 oz) 77.1 kg (170 lb)    History of present illness:  82 y.o. female with PMHx of dementia, fibromyalgia and HTN, who presents to the Emergency Department from [facility] for an evaluation s/p an unwitnessed fall that occurred this evening.  She had recently been seen in ED several days ago for another unwitnessed fall and was sent back to facility after being worked up.   Pt has a bump to her central forehead and laceration on her right index finger. She denies pain to any area of her body. She denies shortness of breath and any other complaints.  She was noted to be severely hypoxic on exam and her pulse ox dropped into the 60s without supplemental  oxygen. She has emphysema but had not been on oxygen previously.  The patient was fully evaluated in ED but no pulmonary embolus or pneumonia was found to explain her hypoxia.  She is being admitted for further observation and management.    Hospital Course:  1. Frequent unwitnessed falls at nursing facility - The patient has dementia and unable to explain the circumstances of these fall. She has denied falling at all and only recalls the last episode and says she tripped, as cause for her fall. Positive orthostatic changes around admission. After IVF's PT eval done and HH PT recommended at the facility. Patient antihypertensive regimen adjusted. Patient needs adequate hydration.   2. Nausea and vomiting this AM - unclear etiology, resolved at discharge. No longer experiencing any GI upset symptoms. Advise to keep herself hydrated  3. Hypoxia - suspect that this is secondary to her emphysema, COPD.CXR w/o acute abnormalities. Will discharge on PRN combivent  4. Hypokalemia - supplemented and WNL at discharge. Given chronic use of diuretics will discharge on maintenance supplementation.  5. Mild dehydration-gentle IV fluid hydration provided and pt looks by time of discharge,encourage to have good hydration and nutrition  6. CKD stage 3-creatinine appears to be stable an essentially at baseline from previous testing. Follow up BMET in 5 days and ensure adequate hydration. 7. Diffuse rash - unclear etiology, appears to be secondary to dry skin/eczema. placed on Hydrocortisone cream and PRN benadryl cream.  8. Hypertensive chronic diastolic heart failure - most recent Echo found was from  2011 where she was noted to have reasonably good systolic function but grade 1 diastolic heart dysfunction and LVH. Continue daily weights, low sodium diet and antihypertensive regimen. Will need to be closely follow as patient experiencing orthostatic changes.  9. Diet controlled diabetes - carb modified heart healthy  diet recommended. 10. Dementia-appears to be at baseline. Will continue home medication regimen at this point. 11. Leukocytosis-no source of infection found on exam, likely reactive to recent fall and mild dehydration. WBC trending down and WNL at discharge 12. Hypertension-will resume only hydralazine, metoprolol, clonidine and low dose lasix. Needs to follow up low dose sodium diet.  13. Obesity - Body mass index is 31.09 kg/m. 14. Anxiety: continue PRN xanax  Procedures:  See below for x-ray reports   Consultations:  None   Discharge Exam: Vitals:   08/26/16 0405 08/26/16 1342  BP: (!) 144/50 (!) 117/54  Pulse: (!) 48 (!) 53  Resp: (!) 22 18  Temp: 98.3 F (36.8 C) 98.4 F (36.9 C)   General exam: Appears in stable condition, in no distress; afebrile and with good O2 sat on RA. Respiratory system: scattered rhonchi, no wheezing, good air movement and not need for oxygen supplementation. Cardiovascular system: S1 & S2 heard, RRR. No JVD, rubs, gallops or clicks. No pedal edema. Gastrointestinal system: Abdomen is nondistended, soft and nontender. No organomegaly or masses felt.  Neurologic exam: oriented X2 (person and place), no focal deficit appreciated.   Discharge Instructions   Discharge Instructions    Diet - low sodium heart healthy    Complete by:  As directed    Discharge instructions    Complete by:  As directed    Take medications as prescribed Follow up with PCP in 10 days Please assure good hydration Low sodium diet (less than 2 gram daily) recommended   Increase activity slowly    Complete by:  As directed      Current Discharge Medication List    START taking these medications   Details  diphenhydrAMINE-zinc acetate (BENADRYL) cream Apply topically 3 (three) times daily as needed for itching. Qty: 28.4 g, Refills: 0    HYDROcodone-acetaminophen (NORCO/VICODIN) 5-325 MG tablet Take 1 tablet by mouth every 6 (six) hours as needed. Qty: 15 tablet,  Refills: 0    hydrocortisone cream 1 % Apply topically 3 (three) times daily. Qty: 30 g, Refills: 0    Ipratropium-Albuterol (COMBIVENT RESPIMAT) 20-100 MCG/ACT AERS respimat Inhale 1 puff into the lungs every 6 (six) hours as needed for wheezing or shortness of breath. Qty: 1 Inhaler, Refills: 0    metoprolol succinate (TOPROL-XL) 25 MG 24 hr tablet Take 1 tablet (25 mg total) by mouth at bedtime. Qty: 30 tablet, Refills: 1      CONTINUE these medications which have CHANGED   Details  furosemide (LASIX) 20 MG tablet Take 1 tablet (20 mg total) by mouth daily.    hydrALAZINE (APRESOLINE) 25 MG tablet Take 2 tablets (50 mg total) by mouth 3 (three) times daily.      CONTINUE these medications which have NOT CHANGED   Details  acetaminophen (TYLENOL) 500 MG tablet Take 500 mg by mouth every 4 (four) hours as needed for mild pain, fever or headache. DO NOT EXCEED 4 GM OF TYLENOL IN 24 HOURS    ALPRAZolam (XANAX) 0.25 MG tablet Take 0.25 mg by mouth every 8 (eight) hours as needed for anxiety.     alum & mag hydroxide-simeth (MINTOX) 200-200-20 MG/5ML suspension Take 30  mLs by mouth as needed for indigestion or heartburn. No more than 4 doses per 24 hours    Calcium Carb-Cholecalciferol (OYSTER SHELL CALCIUM 500+D PO) Take 1 tablet by mouth daily.     Cholecalciferol (VITAMIN D-3) 1000 UNITS CAPS Take 1,000 Units by mouth daily.     cloNIDine (CATAPRES) 0.3 MG tablet Take 0.3 mg by mouth 2 (two) times daily.    guaifenesin (ROBITUSSIN) 100 MG/5ML syrup Take 200 mg by mouth every 6 (six) hours as needed for cough.    lamoTRIgine (LAMICTAL) 25 MG tablet Take 50 mg by mouth daily.    loperamide (IMODIUM) 2 MG capsule Take 2 mg by mouth as needed for diarrhea or loose stools. No more than 8 doses in 24 hrs    magnesium hydroxide (MILK OF MAGNESIA) 400 MG/5ML suspension Take 30 mLs by mouth at bedtime as needed for mild constipation.    Memantine HCl-Donepezil HCl (NAMZARIC) 28-10 MG  CP24 Take by mouth daily.    Neomycin-Bacitracin-Polymyxin (TRIPLE ANTIBIOTIC) 3.5-2250927772 OINT Apply 1 application topically as needed (wound care).    potassium chloride SA (K-DUR,KLOR-CON) 20 MEQ tablet Take 20 mEq by mouth 2 (two) times daily.    sennosides-docusate sodium (SENOKOT-S) 8.6-50 MG tablet Take 1 tablet by mouth at bedtime.    sertraline (ZOLOFT) 100 MG tablet Take 100 mg by mouth daily.       STOP taking these medications     diltiazem (DILACOR XR) 240 MG 24 hr capsule      losartan (COZAAR) 100 MG tablet      metoprolol tartrate (LOPRESSOR) 25 MG tablet        Allergies  Allergen Reactions  . Levaquin [Levofloxacin In D5w]     Per MAR  . Penicillins     Per Mar  . Latex Rash    Causes blisters    Follow-up Information    PANDEY, MAHIMA, MD. Schedule an appointment as soon as possible for a visit in 10 day(s).   Specialty:  Internal Medicine Contact information: [place] [address] [phone]           The results of significant diagnostics from this hospitalization (including imaging, microbiology, ancillary and laboratory) are listed below for reference.    Significant Diagnostic Studies: Dg Chest 2 View  Result Date: 08/25/2016 CLINICAL DATA:  Fever EXAM: CHEST  2 VIEW COMPARISON:  08/20/2016 FINDINGS: There is cardiomegaly. Bibasilar atelectasis. Trace bilateral effusions. No overt edema. No acute bony abnormality. IMPRESSION: Cardiomegaly with bibasilar atelectasis and trace effusions. Electronically Signed   By: Rolm Baptise M.D.   On: 08/25/2016 12:16   Dg Chest 2 View  Result Date: 08/20/2016 CLINICAL DATA:  82 year old female with hypoxia and shortness of breath. EXAM: CHEST  2 VIEW COMPARISON:  Chest radiograph dated 10/01/2011 FINDINGS: Two views of the chest demonstrates emphysematous changes of the lungs. There bibasilar linear atelectasis/ scarring. Infiltrate is less likely but not excluded. Clinical correlation  is recommended. No pleural effusion or pneumothorax. There is stable mild cardiomegaly. There is osteopenia with degenerative changes of the spine. Right shoulder hemiarthroplasty. No acute fracture. IMPRESSION: Bibasilar linear atelectasis/ scarring versus less likely infiltrate. Clinical correlation is recommended. Electronically Signed   By: Anner Crete M.D.   On: 08/20/2016 02:15   Ct Head Wo Contrast  Result Date: 08/03/2016 CLINICAL DATA:  [place] out of wheelchair this morning, striking the head in the right side of the face with hematoma. Dementia up. EXAM: CT HEAD WITHOUT  CONTRAST CT MAXILLOFACIAL WITHOUT CONTRAST CT CERVICAL SPINE WITHOUT CONTRAST TECHNIQUE: Multidetector CT imaging of the head, cervical spine, and maxillofacial structures were performed using the standard protocol without intravenous contrast. Multiplanar CT image reconstructions of the cervical spine and maxillofacial structures were also generated. COMPARISON:  MRI 10/03/2011.  CT 10/01/2011. FINDINGS: CT HEAD FINDINGS Brain: Generalized atrophy. Chronic small-vessel ischemic changes affecting the cerebral deep and subcortical white matter. No sign of acute infarction, mass lesion, hemorrhage, hydrocephalus or extra-axial collection. Vascular: There is atherosclerotic calcification of the major vessels at the base of the brain. Skull: No skull fracture. Sinuses/Orbits: Mucosal thickening affecting the right division of the sphenoid sinus. Other: Frontal scalp hematoma. CT MAXILLOFACIAL FINDINGS Osseous: No facial fracture. Orbits: No orbital fracture. No evidence of orbital soft tissue injury. Sinuses: Mucosal thickening affecting the right division of the sphenoid sinus. Other sinuses clear. Soft tissues: Right frontal region scalp hematoma. Limited intracranial: Negative CT CERVICAL SPINE FINDINGS Alignment: Normal Skull base and vertebrae: Negative. No vertebral body or posterior element fracture. Soft tissues and spinal canal:  No soft tissue swelling. No evidence of significant canal compromise. Disc levels: Facet arthropathy most notable on the right at C7-T1 and on the left at C4-5. Degenerative spondylosis with endplate osteophytes. Foraminal encroachment on the right at C3-4, C4-5 and C5-6 and on the left at C4-5, C5-6 and C6-7. Upper chest: Emphysema.  No acute finding. Other: Benign goiter.  Carotid artery calcification. IMPRESSION: Head CT: No acute or traumatic finding. Chronic small-vessel ischemic changes throughout the white matter. Facial CT: No facial fracture. Right forehead scalp hematoma. Inflammatory change with mucosal thickening affecting the right division of the sphenoid sinus. Cervical spine CT: No acute or traumatic finding. Ordinary degenerative changes as outlined above. Electronically Signed   By: Nelson Chimes M.D.   On: 08/03/2016 13:39   Ct Angio Chest Pe W Or Wo Contrast  Result Date: 08/20/2016 CLINICAL DATA:  82 year old female with hypoxia and elevated D-dimer. EXAM: CT ANGIOGRAPHY CHEST WITH CONTRAST TECHNIQUE: Multidetector CT imaging of the chest was performed using the standard protocol during bolus administration of intravenous contrast. Multiplanar CT image reconstructions and MIPs were obtained to evaluate the vascular anatomy. CONTRAST:  80 cc Isovue 370 COMPARISON:  Chest radiograph dated 08/20/2016 FINDINGS: There is emphysematous changes of the lungs. Right lung base linear atelectasis/scarring. No focal consolidation, pleural effusion, or pneumothorax. The central airways are patent. Advanced atherosclerotic calcification of the thoracic aorta. No aneurysmal dilatation or evidence of dissection. Evaluation of the pulmonary arteries is somewhat limited due to respiratory motion artifact and suboptimal opacification and visualization of the distal branches. No central pulmonary artery embolus identified. There is no cardiomegaly or pericardial effusion. Advanced coronary vascular  calcification. No hilar or mediastinal adenopathy identified. Esophagus is grossly unremarkable. Bilateral calcified thyroid nodules noted. There is no axillary adenopathy. Left breast implant. There is osteopenia with degenerative changes of the spine. No acute fracture. The partially visualized 2 cm hypodense lesion in the region of the left kidney, incompletely characterized but possibly a renal cyst. Further evaluation with nonemergent renal ultrasound recommended. There is an 8 mm left adrenal adenoma. The visualized upper abdomen is otherwise unremarkable. IMPRESSION: No acute intrathoracic pathology. No CT evidence of central pulmonary artery embolus. Advanced atherosclerotic calcification of the thoracic aorta. No dissection. Emphysema.  No focal consolidation. Small left adrenal adenoma and a partially visualized probable left renal upper pole cyst. Further evaluation with nonemergent renal ultrasound recommended. Electronically Signed   By: Milas Hock  Radparvar M.D.   On: 08/20/2016 06:40   Ct Cervical Spine Wo Contrast  Result Date: 08/03/2016 CLINICAL DATA:  [place] out of wheelchair this morning, striking the head in the right side of the face with hematoma. Dementia up. EXAM: CT HEAD WITHOUT CONTRAST CT MAXILLOFACIAL WITHOUT CONTRAST CT CERVICAL SPINE WITHOUT CONTRAST TECHNIQUE: Multidetector CT imaging of the head, cervical spine, and maxillofacial structures were performed using the standard protocol without intravenous contrast. Multiplanar CT image reconstructions of the cervical spine and maxillofacial structures were also generated. COMPARISON:  MRI 10/03/2011.  CT 10/01/2011. FINDINGS: CT HEAD FINDINGS Brain: Generalized atrophy. Chronic small-vessel ischemic changes affecting the cerebral deep and subcortical white matter. No sign of acute infarction, mass lesion, hemorrhage, hydrocephalus or extra-axial collection. Vascular: There is atherosclerotic calcification of the major vessels at the base  of the brain. Skull: No skull fracture. Sinuses/Orbits: Mucosal thickening affecting the right division of the sphenoid sinus. Other: Frontal scalp hematoma. CT MAXILLOFACIAL FINDINGS Osseous: No facial fracture. Orbits: No orbital fracture. No evidence of orbital soft tissue injury. Sinuses: Mucosal thickening affecting the right division of the sphenoid sinus. Other sinuses clear. Soft tissues: Right frontal region scalp hematoma. Limited intracranial: Negative CT CERVICAL SPINE FINDINGS Alignment: Normal Skull base and vertebrae: Negative. No vertebral body or posterior element fracture. Soft tissues and spinal canal: No soft tissue swelling. No evidence of significant canal compromise. Disc levels: Facet arthropathy most notable on the right at C7-T1 and on the left at C4-5. Degenerative spondylosis with endplate osteophytes. Foraminal encroachment on the right at C3-4, C4-5 and C5-6 and on the left at C4-5, C5-6 and C6-7. Upper chest: Emphysema.  No acute finding. Other: Benign goiter.  Carotid artery calcification. IMPRESSION: Head CT: No acute or traumatic finding. Chronic small-vessel ischemic changes throughout the white matter. Facial CT: No facial fracture. Right forehead scalp hematoma. Inflammatory change with mucosal thickening affecting the right division of the sphenoid sinus. Cervical spine CT: No acute or traumatic finding. Ordinary degenerative changes as outlined above. Electronically Signed   By: Nelson Chimes M.D.   On: 08/03/2016 13:39   Ct Maxillofacial Wo Contrast  Result Date: 08/03/2016 CLINICAL DATA:  [place] out of wheelchair this morning, striking the head in the right side of the face with hematoma. Dementia up. EXAM: CT HEAD WITHOUT CONTRAST CT MAXILLOFACIAL WITHOUT CONTRAST CT CERVICAL SPINE WITHOUT CONTRAST TECHNIQUE: Multidetector CT imaging of the head, cervical spine, and maxillofacial structures were performed using the standard protocol without intravenous contrast. Multiplanar  CT image reconstructions of the cervical spine and maxillofacial structures were also generated. COMPARISON:  MRI 10/03/2011.  CT 10/01/2011. FINDINGS: CT HEAD FINDINGS Brain: Generalized atrophy. Chronic small-vessel ischemic changes affecting the cerebral deep and subcortical white matter. No sign of acute infarction, mass lesion, hemorrhage, hydrocephalus or extra-axial collection. Vascular: There is atherosclerotic calcification of the major vessels at the base of the brain. Skull: No skull fracture. Sinuses/Orbits: Mucosal thickening affecting the right division of the sphenoid sinus. Other: Frontal scalp hematoma. CT MAXILLOFACIAL FINDINGS Osseous: No facial fracture. Orbits: No orbital fracture. No evidence of orbital soft tissue injury. Sinuses: Mucosal thickening affecting the right division of the sphenoid sinus. Other sinuses clear. Soft tissues: Right frontal region scalp hematoma. Limited intracranial: Negative CT CERVICAL SPINE FINDINGS Alignment: Normal Skull base and vertebrae: Negative. No vertebral body or posterior element fracture. Soft tissues and spinal canal: No soft tissue swelling. No evidence of significant canal compromise. Disc levels: Facet arthropathy most notable on the right at C7-T1  and on the left at C4-5. Degenerative spondylosis with endplate osteophytes. Foraminal encroachment on the right at C3-4, C4-5 and C5-6 and on the left at C4-5, C5-6 and C6-7. Upper chest: Emphysema.  No acute finding. Other: Benign goiter.  Carotid artery calcification. IMPRESSION: Head CT: No acute or traumatic finding. Chronic small-vessel ischemic changes throughout the white matter. Facial CT: No facial fracture. Right forehead scalp hematoma. Inflammatory change with mucosal thickening affecting the right division of the sphenoid sinus. Cervical spine CT: No acute or traumatic finding. Ordinary degenerative changes as outlined above. Electronically Signed   By: Nelson Chimes M.D.   On: 08/03/2016  13:39    Microbiology: Recent Results (from the past 240 hour(s))  MRSA PCR Screening     Status: None   Collection Time: 08/20/16 10:42 AM  Result Value Ref Range Status   MRSA by PCR NEGATIVE NEGATIVE Final    Comment:        The GeneXpert MRSA Assay (FDA approved for NASAL specimens only), is one component of a comprehensive MRSA colonization surveillance program. It is not intended to diagnose MRSA infection nor to guide or monitor treatment for MRSA infections.      Labs: Basic Metabolic Panel:  Recent Labs Lab 08/20/16 0258  08/22/16 0455 08/23/16 0501 08/24/16 0357 08/25/16 0517 08/26/16 0546  NA  --   < > 143 143 144 142 140  K  --   < > 3.9 4.0 4.0 4.2 4.9  CL  --   < > 108 109 109 108 104  CO2  --   < > 27 30 29 31  32  GLUCOSE  --   < > 93 96 101* 101* 107*  BUN  --   < > 28* 30* 20 17 30*  CREATININE  --   < > 1.66* 1.66* 1.23* 1.34* 1.75*  CALCIUM  --   < > 8.4* 8.7* 9.2 9.0 9.2  MG 2.2  --   --   --   --   --   --   < > = values in this interval not displayed.  CBC:  Recent Labs Lab 08/20/16 0258  08/22/16 0455 08/23/16 0501 08/24/16 0357 08/25/16 0517 08/26/16 0546  WBC 13.6*  < > 10.9* 9.5 9.6 9.1 8.2  NEUTROABS 7.9*  --   --   --   --   --   --   HGB 12.0  < > 10.3* 9.7* 11.2* 10.5* 9.9*  HCT 37.9  < > 33.0* 31.2* 35.9* 33.8* 31.6*  MCV 92.2  < > 91.4 91.0 93.5 94.2 90.8  PLT 315  < > 274 235 279 266 247  < > = values in this interval not displayed.   CBG:  Recent Labs Lab 08/24/16 0806 08/24/16 1136 08/24/16 1710  GLUCAP 92 117* 98    Signed:  Barton Dubois MD.  [facility] 08/26/2016, 2:07 PM

## 2016-08-27 ENCOUNTER — Emergency Department (HOSPITAL_COMMUNITY): Payer: Medicare Other

## 2016-08-27 ENCOUNTER — Encounter (HOSPITAL_COMMUNITY): Payer: Self-pay | Admitting: Emergency Medicine

## 2016-08-27 ENCOUNTER — Emergency Department (HOSPITAL_COMMUNITY)
Admission: EM | Admit: 2016-08-27 | Discharge: 2016-08-27 | Disposition: A | Discharge status: Home / Self Care | Payer: Medicare Other | Attending: Emergency Medicine | Admitting: Emergency Medicine

## 2016-08-27 DIAGNOSIS — I130 Hypertensive heart and chronic kidney disease with heart failure and stage 1 through stage 4 chronic kidney disease, or unspecified chronic kidney disease: Secondary | ICD-10-CM | POA: Diagnosis not present

## 2016-08-27 DIAGNOSIS — Y999 Unspecified external cause status: Secondary | ICD-10-CM | POA: Diagnosis not present

## 2016-08-27 DIAGNOSIS — S0990XA Unspecified injury of head, initial encounter: Secondary | ICD-10-CM

## 2016-08-27 DIAGNOSIS — Z87891 Personal history of nicotine dependence: Secondary | ICD-10-CM | POA: Diagnosis not present

## 2016-08-27 DIAGNOSIS — S0093XA Contusion of unspecified part of head, initial encounter: Secondary | ICD-10-CM | POA: Diagnosis not present

## 2016-08-27 DIAGNOSIS — W1839XA Other fall on same level, initial encounter: Secondary | ICD-10-CM | POA: Diagnosis not present

## 2016-08-27 DIAGNOSIS — J449 Chronic obstructive pulmonary disease, unspecified: Secondary | ICD-10-CM | POA: Diagnosis not present

## 2016-08-27 DIAGNOSIS — Y929 Unspecified place or not applicable: Secondary | ICD-10-CM | POA: Insufficient documentation

## 2016-08-27 DIAGNOSIS — N189 Chronic kidney disease, unspecified: Secondary | ICD-10-CM | POA: Insufficient documentation

## 2016-08-27 DIAGNOSIS — I5032 Chronic diastolic (congestive) heart failure: Secondary | ICD-10-CM | POA: Diagnosis not present

## 2016-08-27 DIAGNOSIS — Y939 Activity, unspecified: Secondary | ICD-10-CM | POA: Insufficient documentation

## 2016-08-27 DIAGNOSIS — Z79899 Other long term (current) drug therapy: Secondary | ICD-10-CM | POA: Insufficient documentation

## 2016-08-27 NOTE — ED Notes (Signed)
Patient transported to CT 

## 2016-08-27 NOTE — ED Notes (Signed)
Attempted to call report to [facility]. No answer, message was left to return call for report.

## 2016-08-27 NOTE — ED Provider Notes (Signed)
[facility] DEPT Provider Note   CSN: [id number] Arrival date & time: 08/27/16  0406     History   Chief Complaint Chief Complaint  Patient presents with  . Fall    HPI Kaylee Turner is a 82 y.o. female.  Patient presents to the emergency department for evaluation after a fall. Patient reportedly fell up against the wall and then slid to the ground. She has had several falls recently. There is an area of bruising on her right forehead that is old, but staff at the home report that she has a new area of contusion on her right forehead as well. At arrival to the ER, patient is without complaints. She denies headache, neck pain, back pain, leg and hip pain. She does not feel any shortness of breath or chest pain.      Past Medical History:  Diagnosis Date  . Cancer ([facility])   . Dementia   . Fibromyalgia   . Hyperaldosteronism ([place])   . Hypertension   . Hypokalemia   . Renal disorder    CKD  . Vitamin D deficiency     Patient Active Problem List   Diagnosis Date Noted  . Hypoxia 08/20/2016  . COPD (chronic obstructive pulmonary disease) with emphysema ([place]) 08/20/2016  . Frequent falls 08/20/2016  . Leukocytosis 08/20/2016  . Prolonged Q-T interval on ECG 08/20/2016  . Pressure injury of skin 08/20/2016  . Diabetes mellitus type 2 in obese ([place]) 12/31/2015  . Recurrent UTI 09/26/2015  . Depression with anxiety 09/26/2015  . Chronic diastolic heart failure ([place]) 08/04/2015  . Hypertensive heart and renal disease with congestive heart failure ([place]) 08/04/2015  . Diabetes mellitus, new onset ([place]) 06/04/2015  . Bilateral edema of lower extremity 06/04/2015  . Depression due to dementia 06/04/2015  . Hypertensive heart disease with heart failure ([place]) 02/11/2015  . Generalized anxiety disorder 02/11/2015  . Malignant neoplasm of female breast ([place]) 02/11/2015  . Dementia without behavioral disturbance 11/12/2014  . CHF, stage C ([place]) 11/12/2014  . Anxiety state  11/12/2014  . Essential hypertension 09/28/2014  . Chronic kidney disease (CKD) 09/28/2014  . PVD (peripheral vascular disease) ([place]) 09/28/2014  . Hypertensive heart failure ([place]) 09/28/2014  . Primary generalized (osteo)arthritis 09/28/2014  . Dementia, senile 09/28/2014  . Neoplasm of breast 09/28/2014  . Age-related nuclear cataract of left eye 09/28/2014  . Hypokalemia 10/01/2011    Past Surgical History:  Procedure Laterality Date  . BREAST SURGERY  RIGHT LUMPECTOMY    OB History    No data available       Home Medications    Prior to Admission medications   Medication Sig Start Date End Date Taking? Authorizing Provider  acetaminophen (TYLENOL) 500 MG tablet Take 500 mg by mouth every 4 (four) hours as needed for mild pain, fever or headache. DO NOT EXCEED 4 GM OF TYLENOL IN 24 HOURS    Historical Provider, MD  ALPRAZolam (XANAX) 0.25 MG tablet Take 0.25 mg by mouth every 8 (eight) hours as needed for anxiety.     Historical Provider, MD  alum & mag hydroxide-simeth (MINTOX) 200-200-20 MG/5ML suspension Take 30 mLs by mouth as needed for indigestion or heartburn. No more than 4 doses per 24 hours    Historical Provider, MD  Calcium Carb-Cholecalciferol (OYSTER SHELL CALCIUM 500+D PO) Take 1 tablet by mouth daily.     Historical Provider, MD  Cholecalciferol (VITAMIN D-3) 1000 UNITS CAPS Take 1,000 Units by mouth daily.  Historical Provider, MD  cloNIDine (CATAPRES) 0.3 MG tablet Take 0.3 mg by mouth 2 (two) times daily.    Historical Provider, MD  diphenhydrAMINE-zinc acetate (BENADRYL) cream Apply topically 3 (three) times daily as needed for itching. 08/26/16   Barton Dubois, MD  furosemide (LASIX) 20 MG tablet Take 1 tablet (20 mg total) by mouth daily. 08/26/16   Barton Dubois, MD  guaifenesin (ROBITUSSIN) 100 MG/5ML syrup Take 200 mg by mouth every 6 (six) hours as needed for cough.    Historical Provider, MD  hydrALAZINE (APRESOLINE) 25 MG tablet Take 2 tablets (50  mg total) by mouth 3 (three) times daily. 08/26/16   Barton Dubois, MD  HYDROcodone-acetaminophen (NORCO/VICODIN) 5-325 MG tablet Take 1 tablet by mouth every 6 (six) hours as needed for severe pain. 08/26/16   Barton Dubois, MD  hydrocortisone cream 1 % Apply topically 3 (three) times daily. 08/26/16   Barton Dubois, MD  Ipratropium-Albuterol (COMBIVENT RESPIMAT) 20-100 MCG/ACT AERS respimat Inhale 1 puff into the lungs every 6 (six) hours as needed for wheezing or shortness of breath. 08/26/16   Barton Dubois, MD  lamoTRIgine (LAMICTAL) 25 MG tablet Take 50 mg by mouth daily.    Historical Provider, MD  loperamide (IMODIUM) 2 MG capsule Take 2 mg by mouth as needed for diarrhea or loose stools. No more than 8 doses in 24 hrs    Historical Provider, MD  magnesium hydroxide (MILK OF MAGNESIA) 400 MG/5ML suspension Take 30 mLs by mouth at bedtime as needed for mild constipation.    Historical Provider, MD  Memantine HCl-Donepezil HCl (NAMZARIC) 28-10 MG CP24 Take by mouth daily.    Historical Provider, MD  metoprolol succinate (TOPROL-XL) 25 MG 24 hr tablet Take 1 tablet (25 mg total) by mouth at bedtime. 08/26/16   Barton Dubois, MD  Neomycin-Bacitracin-Polymyxin (TRIPLE ANTIBIOTIC) [phone] OINT Apply 1 application topically as needed (wound care).    Historical Provider, MD  potassium chloride SA (K-DUR,KLOR-CON) 20 MEQ tablet Take 20 mEq by mouth 2 (two) times daily.    Historical Provider, MD  sennosides-docusate sodium (SENOKOT-S) 8.6-50 MG tablet Take 1 tablet by mouth at bedtime.    Historical Provider, MD  sertraline (ZOLOFT) 100 MG tablet Take 100 mg by mouth daily.     Historical Provider, MD    Family History Family History  Problem Relation Age of Onset  . Crohn's disease Daughter   . COPD Daughter     Social History Social History  Substance Use Topics  . Smoking status: Former Research scientist (life sciences)  . Smokeless tobacco: Never Used  . Alcohol use No     Allergies   Levaquin [levofloxacin  in d5w]; Penicillins; and Latex   Review of Systems Review of Systems  Respiratory: Negative for shortness of breath.   Cardiovascular: Negative for chest pain.  All other systems reviewed and are negative.    Physical Exam Updated Vital Signs BP 183/63 (BP Location: Right Arm)   Pulse (!) 48   Temp 98.3 F (36.8 C) (Oral)   SpO2 (!) 89%   Physical Exam  Constitutional: She is oriented to person, place, and time. She appears well-developed and well-nourished. No distress.  HENT:  Head: Normocephalic. Head is with contusion.    Right Ear: Hearing normal.  Left Ear: Hearing normal.  Nose: Nose normal.  Mouth/Throat: Oropharynx is clear and moist and mucous membranes are normal.  Eyes: Conjunctivae and EOM are normal. Pupils are equal, round, and reactive to light.  Neck: Normal range of  motion. Neck supple. No spinous process tenderness and no muscular tenderness present.  Cardiovascular: Regular rhythm, S1 normal and S2 normal.  Exam reveals no gallop and no friction rub.   No murmur heard. Pulmonary/Chest: Effort normal and breath sounds normal. No respiratory distress. She exhibits no tenderness.  Abdominal: Soft. Normal appearance and bowel sounds are normal. There is no hepatosplenomegaly. There is no tenderness. There is no rebound, no guarding, no tenderness at McBurney's point and negative Murphy's sign. No hernia.  Musculoskeletal: Normal range of motion.  Neurological: She is alert and oriented to person, place, and time. She has normal strength. No cranial nerve deficit or sensory deficit. Coordination normal. GCS eye subscore is 4. GCS verbal subscore is 5. GCS motor subscore is 6.  Skin: Skin is warm, dry and intact. No rash noted. No cyanosis.  Psychiatric: She has a normal mood and affect. Her speech is normal and behavior is normal. Thought content normal.  Nursing note and vitals reviewed.    ED Treatments / Results  Labs (all labs ordered are listed, but  only abnormal results are displayed) Labs Reviewed - No data to display  EKG  EKG Interpretation None       Radiology Dg Chest 2 View  Result Date: 08/25/2016 CLINICAL DATA:  Fever EXAM: CHEST  2 VIEW COMPARISON:  08/20/2016 FINDINGS: There is cardiomegaly. Bibasilar atelectasis. Trace bilateral effusions. No overt edema. No acute bony abnormality. IMPRESSION: Cardiomegaly with bibasilar atelectasis and trace effusions. Electronically Signed   By: Rolm Baptise M.D.   On: 08/25/2016 12:16   Ct Head Wo Contrast  Result Date: 08/27/2016 CLINICAL DATA:  Unwitnessed fall while coming out of bathroom. Possible head injury. History of dementia, hypertension and breast cancer. EXAM: CT HEAD WITHOUT CONTRAST TECHNIQUE: Contiguous axial images were obtained from the base of the skull through the vertex without intravenous contrast. COMPARISON:  CT HEAD Apr 02, 2016 FINDINGS: BRAIN: The ventricles and sulci are normal for age, cavum septum pellucidum. No intraparenchymal hemorrhage, mass effect nor midline shift. Patchy to confluent supratentorial white matter hypodensities. No acute large vascular territory infarcts. No abnormal extra-axial fluid collections. Basal cisterns are patent. VASCULAR: Moderate calcific atherosclerosis of the carotid siphons. SKULL: No skull fracture. Residual small RIGHT frontal scalp hematoma. SINUSES/ORBITS: Persistent RIGHT sphenoid sinus opacification. Mastoid air cells are well aerated. Debris within the external auditory canals most compatible with cerumen. The included ocular globes and orbital contents are non-suspicious. OTHER: None. IMPRESSION: No acute intracranial process. Small resolving RIGHT frontal scalp hematoma.  No skull fracture. Involutional changes. Moderate to severe chronic small vessel ischemic disease. Electronically Signed   By: Elon Alas M.D.   On: 08/27/2016 05:06    Procedures Procedures (including critical care time)  Medications Ordered  in ED Medications - No data to display   Initial Impression / Assessment and Plan / ED Course  I have reviewed the triage vital signs and the nursing notes.  Pertinent labs & imaging results that were available during my care of the patient were reviewed by me and considered in my medical decision making (see chart for details).  Clinical Course    Presents to the ER for evaluation after a fall. Patient has a healing contusion on her forehead but has a new area of contusion as well. She does not have any other areas of injury. CT head unremarkable.  Final Clinical Impressions(s) / ED Diagnoses   Final diagnoses:  Contusion of head, unspecified part of head, initial encounter  Injury of head, initial encounter    New Prescriptions New Prescriptions   No medications on file     Orpah Greek, MD 08/27/16 [phone]

## 2016-08-27 NOTE — ED Triage Notes (Signed)
Patient BIB [facility] from [facility] for unwitnessed fall. Patient was coming out of bathroom, lost balance and fell against the wall and slid down the wall to the floor. Facility concerned patient may have hit her head. Redness noted to face, between her eyes and an old bruise noted above that, per EMS. Patient has Hx of dementia, and HTN.

## 2016-08-27 NOTE — ED Notes (Signed)
Bed: WA21 Expected date:  Expected time:  Means of arrival:  Comments: 82 yr old, witnessed fall, no head injury

## 2016-09-03 ENCOUNTER — Emergency Department (HOSPITAL_COMMUNITY): Payer: Medicare Other

## 2016-09-03 ENCOUNTER — Encounter (HOSPITAL_COMMUNITY): Payer: Self-pay | Admitting: Emergency Medicine

## 2016-09-03 ENCOUNTER — Inpatient Hospital Stay (HOSPITAL_COMMUNITY)
Admission: EM | Admit: 2016-09-03 | Discharge: 2016-09-08 | DRG: 281 | Disposition: A | Discharge status: Skilled Nursing Facility | Payer: Medicare Other | Attending: Internal Medicine | Admitting: Internal Medicine

## 2016-09-03 DIAGNOSIS — R0902 Hypoxemia: Secondary | ICD-10-CM | POA: Diagnosis present

## 2016-09-03 DIAGNOSIS — J439 Emphysema, unspecified: Secondary | ICD-10-CM | POA: Diagnosis present

## 2016-09-03 DIAGNOSIS — N3000 Acute cystitis without hematuria: Secondary | ICD-10-CM | POA: Diagnosis not present

## 2016-09-03 DIAGNOSIS — E1122 Type 2 diabetes mellitus with diabetic chronic kidney disease: Secondary | ICD-10-CM | POA: Diagnosis present

## 2016-09-03 DIAGNOSIS — R001 Bradycardia, unspecified: Secondary | ICD-10-CM | POA: Diagnosis present

## 2016-09-03 DIAGNOSIS — I130 Hypertensive heart and chronic kidney disease with heart failure and stage 1 through stage 4 chronic kidney disease, or unspecified chronic kidney disease: Secondary | ICD-10-CM | POA: Diagnosis present

## 2016-09-03 DIAGNOSIS — Z825 Family history of asthma and other chronic lower respiratory diseases: Secondary | ICD-10-CM

## 2016-09-03 DIAGNOSIS — E269 Hyperaldosteronism, unspecified: Secondary | ICD-10-CM | POA: Diagnosis present

## 2016-09-03 DIAGNOSIS — R296 Repeated falls: Secondary | ICD-10-CM | POA: Diagnosis present

## 2016-09-03 DIAGNOSIS — I214 Non-ST elevation (NSTEMI) myocardial infarction: Principal | ICD-10-CM | POA: Diagnosis present

## 2016-09-03 DIAGNOSIS — N766 Ulceration of vulva: Secondary | ICD-10-CM | POA: Diagnosis not present

## 2016-09-03 DIAGNOSIS — E860 Dehydration: Secondary | ICD-10-CM | POA: Diagnosis present

## 2016-09-03 DIAGNOSIS — E875 Hyperkalemia: Secondary | ICD-10-CM | POA: Diagnosis not present

## 2016-09-03 DIAGNOSIS — M797 Fibromyalgia: Secondary | ICD-10-CM | POA: Diagnosis present

## 2016-09-03 DIAGNOSIS — Z87891 Personal history of nicotine dependence: Secondary | ICD-10-CM

## 2016-09-03 DIAGNOSIS — N179 Acute kidney failure, unspecified: Secondary | ICD-10-CM | POA: Diagnosis present

## 2016-09-03 DIAGNOSIS — N184 Chronic kidney disease, stage 4 (severe): Secondary | ICD-10-CM | POA: Diagnosis not present

## 2016-09-03 DIAGNOSIS — Z66 Do not resuscitate: Secondary | ICD-10-CM | POA: Diagnosis not present

## 2016-09-03 DIAGNOSIS — Z515 Encounter for palliative care: Secondary | ICD-10-CM | POA: Diagnosis not present

## 2016-09-03 DIAGNOSIS — B372 Candidiasis of skin and nail: Secondary | ICD-10-CM | POA: Diagnosis present

## 2016-09-03 DIAGNOSIS — F0390 Unspecified dementia without behavioral disturbance: Secondary | ICD-10-CM | POA: Diagnosis present

## 2016-09-03 DIAGNOSIS — Z853 Personal history of malignant neoplasm of breast: Secondary | ICD-10-CM

## 2016-09-03 DIAGNOSIS — F0280 Dementia in other diseases classified elsewhere without behavioral disturbance: Secondary | ICD-10-CM

## 2016-09-03 DIAGNOSIS — D72829 Elevated white blood cell count, unspecified: Secondary | ICD-10-CM | POA: Diagnosis present

## 2016-09-03 DIAGNOSIS — E1151 Type 2 diabetes mellitus with diabetic peripheral angiopathy without gangrene: Secondary | ICD-10-CM | POA: Diagnosis present

## 2016-09-03 DIAGNOSIS — F419 Anxiety disorder, unspecified: Secondary | ICD-10-CM | POA: Diagnosis present

## 2016-09-03 DIAGNOSIS — R9431 Abnormal electrocardiogram [ECG] [EKG]: Secondary | ICD-10-CM | POA: Diagnosis present

## 2016-09-03 DIAGNOSIS — N390 Urinary tract infection, site not specified: Secondary | ICD-10-CM | POA: Diagnosis present

## 2016-09-03 DIAGNOSIS — E559 Vitamin D deficiency, unspecified: Secondary | ICD-10-CM | POA: Diagnosis present

## 2016-09-03 DIAGNOSIS — J449 Chronic obstructive pulmonary disease, unspecified: Secondary | ICD-10-CM | POA: Diagnosis present

## 2016-09-03 DIAGNOSIS — G301 Alzheimer's disease with late onset: Secondary | ICD-10-CM

## 2016-09-03 DIAGNOSIS — I5032 Chronic diastolic (congestive) heart failure: Secondary | ICD-10-CM | POA: Diagnosis not present

## 2016-09-03 DIAGNOSIS — E119 Type 2 diabetes mellitus without complications: Secondary | ICD-10-CM

## 2016-09-03 DIAGNOSIS — M25512 Pain in left shoulder: Secondary | ICD-10-CM | POA: Diagnosis present

## 2016-09-03 DIAGNOSIS — I10 Essential (primary) hypertension: Secondary | ICD-10-CM | POA: Diagnosis present

## 2016-09-03 DIAGNOSIS — Z87440 Personal history of urinary (tract) infections: Secondary | ICD-10-CM | POA: Diagnosis not present

## 2016-09-03 LAB — CBC WITH DIFFERENTIAL/PLATELET
BASOS PCT: 0 %
Basophils Absolute: 0 10*3/uL (ref 0.0–0.1)
Eosinophils Absolute: 0 10*3/uL (ref 0.0–0.7)
Eosinophils Relative: 0 %
HEMATOCRIT: 36.9 % (ref 36.0–46.0)
HEMOGLOBIN: 11.4 g/dL — AB (ref 12.0–15.0)
LYMPHS ABS: 1 10*3/uL (ref 0.7–4.0)
Lymphocytes Relative: 6 %
MCH: 29.2 pg (ref 26.0–34.0)
MCHC: 30.9 g/dL (ref 30.0–36.0)
MCV: 94.4 fL (ref 78.0–100.0)
MONO ABS: 1.1 10*3/uL — AB (ref 0.1–1.0)
MONOS PCT: 7 %
NEUTROS ABS: 13.4 10*3/uL — AB (ref 1.7–7.7)
NEUTROS PCT: 87 %
Platelets: 260 10*3/uL (ref 150–400)
RBC: 3.91 MIL/uL (ref 3.87–5.11)
RDW: 15.5 % (ref 11.5–15.5)
WBC: 15.5 10*3/uL — ABNORMAL HIGH (ref 4.0–10.5)

## 2016-09-03 LAB — GLUCOSE, CAPILLARY
Glucose-Capillary: 167 mg/dL — ABNORMAL HIGH (ref 65–99)
Glucose-Capillary: 139 mg/dL — ABNORMAL HIGH (ref 65–99)
Glucose-Capillary: 104 mg/dL — ABNORMAL HIGH (ref 65–99)

## 2016-09-03 LAB — TROPONIN I
TROPONIN I: 1.19 ng/mL — AB (ref <0.03)
Troponin I: 1.31 ng/mL (ref <0.03)
Troponin I: 0.82 ng/mL (ref <0.03)

## 2016-09-03 LAB — BASIC METABOLIC PANEL
ANION GAP: 6 (ref 5–15)
Anion gap: 8 (ref 5–15)
BUN: 25 mg/dL — AB (ref 6–20)
BUN: 27 mg/dL — AB (ref 6–20)
CALCIUM: 9.2 mg/dL (ref 8.9–10.3)
CALCIUM: 8.8 mg/dL — AB (ref 8.9–10.3)
CHLORIDE: 108 mmol/L (ref 101–111)
CO2: 22 mmol/L (ref 22–32)
CO2: 23 mmol/L (ref 22–32)
CREATININE: 1.87 mg/dL — AB (ref 0.44–1.00)
CREATININE: 1.84 mg/dL — AB (ref 0.44–1.00)
Chloride: 107 mmol/L (ref 101–111)
GFR calc Af Amer: 28 mL/min — ABNORMAL LOW (ref >60)
GFR calc non Af Amer: 24 mL/min — ABNORMAL LOW (ref >60)
GFR calc non Af Amer: 24 mL/min — ABNORMAL LOW (ref >60)
GFR, EST AFRICAN AMERICAN: 28 mL/min — AB (ref >60)
GLUCOSE: 207 mg/dL — AB (ref 65–99)
GLUCOSE: 159 mg/dL — AB (ref 65–99)
Potassium: 5.6 mmol/L — ABNORMAL HIGH (ref 3.5–5.1)
Potassium: 5.6 mmol/L — ABNORMAL HIGH (ref 3.5–5.1)
Sodium: 138 mmol/L (ref 135–145)
Sodium: 136 mmol/L (ref 135–145)

## 2016-09-03 LAB — URINE MICROSCOPIC-ADD ON

## 2016-09-03 LAB — URINALYSIS, ROUTINE W REFLEX MICROSCOPIC
GLUCOSE, UA: NEGATIVE mg/dL
KETONES UR: NEGATIVE mg/dL
NITRITE: POSITIVE — AB
PH: 5.5 (ref 5.0–8.0)
Protein, ur: 30 mg/dL — AB
SPECIFIC GRAVITY, URINE: 1.021 (ref 1.005–1.030)

## 2016-09-03 LAB — PHOSPHORUS: Phosphorus: 3.2 mg/dL (ref 2.5–4.6)

## 2016-09-03 LAB — MAGNESIUM: Magnesium: 2.3 mg/dL (ref 1.7–2.4)

## 2016-09-03 LAB — CREATININE, URINE, RANDOM: CREATININE, URINE: 181.26 mg/dL

## 2016-09-03 MED ORDER — ASPIRIN EC 81 MG PO TBEC
81.0000 mg | DELAYED_RELEASE_TABLET | Freq: Every day | ORAL | Status: DC
  Administered 2016-09-04 – 2016-09-08 (×5): 81 mg via ORAL
  Filled 2016-09-03 (×5): qty 1

## 2016-09-03 MED ORDER — NYSTATIN-TRIAMCINOLONE 100000-0.1 UNIT/GM-% EX CREA
TOPICAL_CREAM | Freq: Two times a day (BID) | CUTANEOUS | Status: DC
  Administered 2016-09-03: 1 via TOPICAL
  Administered 2016-09-03 – 2016-09-04 (×3): via TOPICAL
  Administered 2016-09-05: 1 via TOPICAL
  Administered 2016-09-05 – 2016-09-08 (×5): via TOPICAL
  Filled 2016-09-03: qty 15

## 2016-09-03 MED ORDER — ALPRAZOLAM 0.25 MG PO TABS
0.2500 mg | ORAL_TABLET | Freq: Three times a day (TID) | ORAL | Status: DC | PRN
  Administered 2016-09-06: 0.25 mg via ORAL
  Filled 2016-09-03: qty 1

## 2016-09-03 MED ORDER — ASPIRIN EC 325 MG PO TBEC: 325.0000 mg | DELAYED_RELEASE_TABLET | Freq: Every day | ORAL | Status: DC

## 2016-09-03 MED ORDER — NITROGLYCERIN 2 % TD OINT
0.5000 [in_us] | TOPICAL_OINTMENT | Freq: Four times a day (QID) | TRANSDERMAL | Status: DC
  Administered 2016-09-03 – 2016-09-07 (×10): 0.5 [in_us] via TOPICAL
  Filled 2016-09-03: qty 30
  Filled 2016-09-03: qty 1

## 2016-09-03 MED ORDER — VITAMIN D 1000 UNITS PO TABS
1000.0000 [IU] | ORAL_TABLET | Freq: Every day | ORAL | Status: DC
  Administered 2016-09-03 – 2016-09-08 (×6): 1000 [IU] via ORAL
  Filled 2016-09-03 (×8): qty 1

## 2016-09-03 MED ORDER — HEPARIN BOLUS VIA INFUSION
4000.0000 [IU] | Freq: Once | INTRAVENOUS | Status: AC
  Administered 2016-09-03: 4000 [IU] via INTRAVENOUS
  Filled 2016-09-03: qty 4000

## 2016-09-03 MED ORDER — ASPIRIN 81 MG PO CHEW
324.0000 mg | CHEWABLE_TABLET | Freq: Once | ORAL | Status: AC
  Administered 2016-09-03: 324 mg via ORAL
  Filled 2016-09-03: qty 4

## 2016-09-03 MED ORDER — INSULIN ASPART 100 UNIT/ML ~~LOC~~ SOLN
0.0000 [IU] | Freq: Three times a day (TID) | SUBCUTANEOUS | Status: DC
  Administered 2016-09-03: 3 [IU] via SUBCUTANEOUS
  Administered 2016-09-03: 2 [IU] via SUBCUTANEOUS

## 2016-09-03 MED ORDER — METOPROLOL SUCCINATE ER 25 MG PO TB24
25.0000 mg | ORAL_TABLET | Freq: Every day | ORAL | Status: DC
  Administered 2016-09-03 – 2016-09-07 (×5): 25 mg via ORAL
  Filled 2016-09-03 (×5): qty 1

## 2016-09-03 MED ORDER — LAMOTRIGINE 100 MG PO TABS
50.0000 mg | ORAL_TABLET | Freq: Every day | ORAL | Status: DC
  Administered 2016-09-03 – 2016-09-08 (×6): 50 mg via ORAL
  Filled 2016-09-03 (×6): qty 1

## 2016-09-03 MED ORDER — CLONIDINE HCL 0.3 MG PO TABS
0.3000 mg | ORAL_TABLET | Freq: Two times a day (BID) | ORAL | Status: DC
  Administered 2016-09-03 – 2016-09-08 (×11): 0.3 mg via ORAL
  Filled 2016-09-03 (×11): qty 1

## 2016-09-03 MED ORDER — MORPHINE SULFATE (PF) 2 MG/ML IV SOLN
2.0000 mg | INTRAVENOUS | Status: DC | PRN
  Administered 2016-09-06 – 2016-09-07 (×2): 2 mg via INTRAVENOUS
  Filled 2016-09-03 (×2): qty 1

## 2016-09-03 MED ORDER — INSULIN ASPART 100 UNIT/ML ~~LOC~~ SOLN: 0.0000 [IU] | Freq: Every day | SUBCUTANEOUS | Status: DC

## 2016-09-03 MED ORDER — ACETAMINOPHEN 325 MG PO TABS
650.0000 mg | ORAL_TABLET | ORAL | Status: DC | PRN
  Filled 2016-09-03: qty 2

## 2016-09-03 MED ORDER — SIMVASTATIN 20 MG PO TABS
20.0000 mg | ORAL_TABLET | Freq: Every day | ORAL | Status: DC
  Administered 2016-09-03 – 2016-09-07 (×3): 20 mg via ORAL
  Filled 2016-09-03 (×5): qty 1

## 2016-09-03 MED ORDER — SENNOSIDES-DOCUSATE SODIUM 8.6-50 MG PO TABS
1.0000 | ORAL_TABLET | Freq: Every day | ORAL | Status: DC
  Administered 2016-09-03 – 2016-09-07 (×5): 1 via ORAL
  Filled 2016-09-03 (×10): qty 1

## 2016-09-03 MED ORDER — SODIUM CHLORIDE 0.9 % IV BOLUS (SEPSIS)
250.0000 mL | Freq: Once | INTRAVENOUS | Status: AC
  Administered 2016-09-03: 250 mL via INTRAVENOUS

## 2016-09-03 MED ORDER — HEPARIN (PORCINE) IN NACL 100-0.45 UNIT/ML-% IJ SOLN
800.0000 [IU]/h | INTRAMUSCULAR | Status: DC
  Administered 2016-09-03: 800 [IU]/h via INTRAVENOUS
  Filled 2016-09-03 (×2): qty 250

## 2016-09-03 MED ORDER — DEXTROSE 5 % IV SOLN
1.0000 g | INTRAVENOUS | Status: DC
  Administered 2016-09-03 – 2016-09-07 (×5): 1 g via INTRAVENOUS
  Filled 2016-09-03 (×6): qty 10

## 2016-09-03 NOTE — Clinical Social Work Note (Signed)
CSW spoke with pt's sister [name] via telephone. Pt's daughter reports that if pt is able to go back to assisted living she would like for her to go back to [facility] however, if she needs a SNF placement she would prefer [facility] as first choice and [facility] as backup. Pt's sister reports pt has been to both of these facilities. CSW will follow up with pt's sister regarding pt's recommendations once assessed.  [address], [place]

## 2016-09-03 NOTE — H&P (Signed)
History and Physical    Kaylee Turner [id number] DOB: 19-Sep-1931 DOA: 09/03/2016   PCP: No primary care provider on file. /UNASSIGNED  Patient coming from/Resides with: ALF  Admission status: Inpatient/Telemetry-medically necessary to stay a minimum 2 midnights to rule out impending and/or unexpected changes in physiologic status that may differ from initial evaluation performed in the ER and/or at time of admission. Patient has been admitted with left shoulder and chest pain with associated EKG abnormalities and elevated troponin consistent with an ischemic cardiac event/NSTEMI. Cardiology has recommended maximization of medical therapies. She will require close monitoring of hemodynamic status, serial troponin collections, utilization of topical nitrates and IV heparin. Unfortunately it appears she is not eligible for cardiac catheterization given her advanced chronic kidney disease.  Chief Complaint: Left shoulder pain  HPI: Kaylee Turner is a 82 y.o. female with medical history significant for COPD and apparent issues with hypoxemia during previous admission without evidence of ammonia or heart failure, hypertensive diastolic dysfunction, tonic kidney disease stage IV, dementia, chronic bilateral lower extremity edema, frequent falls, reported hyperaldosteronism, history of breast cancer and recurrent UTI. Patient was recently discharged on 10/4 after treatment for multiple issues: Recurrent falls, COPD with hypoxemia, dehydration prompting discontinuation of Lasix, acute kidney injury. Patient was sent to the ER on 10/5 after experiencing a fall at the facility but was discharged back to the facility. She returned overnight on 10/12 with reports of left shoulder pain. She denied chest pain to the ER physician and she was unable to initially tell the ER physician why she was sent to the ER. EKG was performed that did show diffuse T-wave inversion and septal lateral leads with subtle  ST elevation in inferior leads. These are new from previous EKG. A troponin was elevated at 1.31. The ER physician spoke with on-call cardiologist Dr. Beau Fanny who recommended medical management and internal medicine admission and instructed the EDP to make sure cardiology was notified in the morning. The patient has received aspirin in the ER.  Upon my evaluation the patient she was denying any shoulder or chest pain. When I asked her why she came to the ER she says she was having pain in her shoulder. She was able to point her left shoulder. When I asked her if she had any chest pain, neck or jaw pain or shortness of breath with the pain she reported she did have chest pain but none of the other symptoms.  ED Course:  Vital Signs: BP 148/75   Pulse 61   Temp 98.4 F (36.9 C) (Oral)   Resp 18   SpO2 99%  PCXR: Increased patchy bibasal opacities that may reflect atelectasis but no definitive infiltrate Lab data: Sodium 138, potassium 5.6, chloride 108, CO2 22, BUN 25, creatinine 1.87, glucose 207, anion gap 8, troponin 1.31, white count 15,300 with neutrophils 87% and absolute neutrophils 13.4%, hemoglobin 11.4, platelets 260,000 Medications and treatments: Aspirin 324 mg by mouth 1  Review of Systems:  **Unable to obtain accurate history from patient given her underlying dementia; chart review does demonstrate patient has had an issue with a rash that has been treated with topical steroids at the facility   Past Medical History:  Diagnosis Date  . Cancer ([place])   . Dementia   . Fibromyalgia   . Hyperaldosteronism ([place])   . Hypertension   . Hypokalemia   . Renal disorder    CKD  . Vitamin D deficiency     Past Surgical History:  Procedure  Laterality Date  . BREAST SURGERY  RIGHT LUMPECTOMY    Social History   Social History  . Marital status: Widowed    Spouse name: N/A  . Number of children: N/A  . Years of education: N/A   Occupational History  . Not on file.   Social  History Main Topics  . Smoking status: Former Research scientist (life sciences)  . Smokeless tobacco: Never Used  . Alcohol use No  . Drug use: No  . Sexual activity: No   Other Topics Concern  . Not on file   Social History Narrative  . No narrative on file    Mobility: Unclear-patient reports she ambulates with assistive devices at the facility-PT evaluation during previous admission reveals patient was able to ambulate short distances with assistance but a caveat was documented that she is primarily wheelchair mobility due to history of recurrent falls  Work history: Not obtained   Allergies  Allergen Reactions  . Levaquin [Levofloxacin In D5w]     Per MAR  . Penicillins     Per Mar  . Latex Rash    Causes blisters     Family History  Problem Relation Age of Onset  . Crohn's disease Daughter   . COPD Daughter      Prior to Admission medications   Medication Sig Start Date End Date Taking? Authorizing Provider  ALPRAZolam (XANAX) 0.25 MG tablet Take 0.25 mg by mouth every 8 (eight) hours as needed for anxiety.    Yes Historical Provider, MD  alum & mag hydroxide-simeth (MINTOX) 200-200-20 MG/5ML suspension Take 30 mLs by mouth as needed for indigestion or heartburn. No more than 4 doses per 24 hours   Yes Historical Provider, MD  Cholecalciferol (VITAMIN D-3) 1000 UNITS CAPS Take 1,000 Units by mouth daily.    Yes Historical Provider, MD  cloNIDine (CATAPRES) 0.3 MG tablet Take 0.3 mg by mouth 2 (two) times daily.   Yes Historical Provider, MD  diphenhydrAMINE-zinc acetate (BENADRYL) cream Apply topically 3 (three) times daily as needed for itching. 08/26/16  Yes Barton Dubois, MD  guaifenesin (ROBITUSSIN) 100 MG/5ML syrup Take 200 mg by mouth every 6 (six) hours as needed for cough.   Yes Historical Provider, MD  hydrocortisone cream 1 % Apply topically 3 (three) times daily. 08/26/16  Yes Barton Dubois, MD  lamoTRIgine (LAMICTAL) 25 MG tablet Take 50 mg by mouth daily.   Yes Historical Provider,  MD  loperamide (IMODIUM) 2 MG capsule Take 2 mg by mouth as needed for diarrhea or loose stools. No more than 8 doses in 24 hrs   Yes Historical Provider, MD  magnesium hydroxide (MILK OF MAGNESIA) 400 MG/5ML suspension Take 30 mLs by mouth at bedtime as needed for mild constipation.   Yes Historical Provider, MD  Memantine HCl-Donepezil HCl (NAMZARIC) 28-10 MG CP24 Take 1 tablet by mouth daily.    Yes Historical Provider, MD  metoprolol succinate (TOPROL-XL) 25 MG 24 hr tablet Take 1 tablet (25 mg total) by mouth at bedtime. 08/26/16  Yes Barton Dubois, MD  potassium chloride SA (K-DUR,KLOR-CON) 20 MEQ tablet Take 20 mEq by mouth 2 (two) times daily.   Yes Historical Provider, MD  sennosides-docusate sodium (SENOKOT-S) 8.6-50 MG tablet Take 1 tablet by mouth at bedtime.   Yes Historical Provider, MD  sertraline (ZOLOFT) 100 MG tablet Take 100 mg by mouth daily.    Yes Historical Provider, MD  Ipratropium-Albuterol (COMBIVENT RESPIMAT) 20-100 MCG/ACT AERS respimat Inhale 1 puff into the lungs every 6 (six)  hours as needed for wheezing or shortness of breath. 08/26/16   Barton Dubois, MD    Physical Exam: Vitals:   09/03/16 0600 09/03/16 0615 09/03/16 0700 09/03/16 0730  BP: 133/86 142/77 145/85 148/75  Pulse: 62 61 64 61  Resp: 20 19 23 18   Temp:      TempSrc:      SpO2: 100% 100% 100% 99%      Constitutional: NAD, calm, comfortable Eyes: PERRL, lids and conjunctivae normal ENMT: Mucous membranes are moist. Posterior pharynx clear of any exudate or lesions.Normal dentition.  Neck: normal, supple, no masses, no thyromegaly Respiratory: clear to auscultation bilaterally, no wheezing, no crackles. Normal respiratory effort. No accessory muscle use.  Cardiovascular: Regular rate and rhythm, no murmurs / rubs / gallops. Bilateral dependent lower extremity edema. 2+ pedal pulses. No carotid bruits.  Abdomen: no tenderness, no masses palpated. No hepatosplenomegaly. Bowel sounds positive.    Musculoskeletal: no clubbing / cyanosis. No joint deformity upper and lower extremities. Good ROM, no contractures. Normal muscle tone.  Skin: no lesions, ulcers. No induration-has a partially confluent macular rash that is reddened and blanchable with satellite lesions consistent with Candida Neurologic: CN 2-12 grossly intact. Sensation intact, DTR normal. Strength 5/5 x all 4 extremities.  Psychiatric: Alert but otherwise not oriented to place, situation or time of year. Normal mood quite pleasant.    Labs on Admission: I have personally reviewed following labs and imaging studies  CBC:  Recent Labs Lab 09/03/16 0410  WBC 15.5*  NEUTROABS 13.4*  HGB 11.4*  HCT 36.9  MCV 94.4  PLT 123456   Basic Metabolic Panel:  Recent Labs Lab 09/03/16 0410  NA 138  K 5.6*  CL 108  CO2 22  GLUCOSE 207*  BUN 25*  CREATININE 1.87*  CALCIUM 9.2   GFR: Estimated Creatinine Clearance: 21.9 mL/min (by C-G formula based on SCr of 1.87 mg/dL (H)). Liver Function Tests: No results for input(s): AST, ALT, ALKPHOS, BILITOT, PROT, ALBUMIN in the last 168 hours. No results for input(s): LIPASE, AMYLASE in the last 168 hours. No results for input(s): AMMONIA in the last 168 hours. Coagulation Profile: No results for input(s): INR, PROTIME in the last 168 hours. Cardiac Enzymes:  Recent Labs Lab 09/03/16 0410  TROPONINI 1.31*   BNP (last 3 results) No results for input(s): PROBNP in the last 8760 hours. HbA1C: No results for input(s): HGBA1C in the last 72 hours. CBG: No results for input(s): GLUCAP in the last 168 hours. Lipid Profile: No results for input(s): CHOL, HDL, LDLCALC, TRIG, CHOLHDL, LDLDIRECT in the last 72 hours. Thyroid Function Tests: No results for input(s): TSH, T4TOTAL, FREET4, T3FREE, THYROIDAB in the last 72 hours. Anemia Panel: No results for input(s): VITAMINB12, FOLATE, FERRITIN, TIBC, IRON, RETICCTPCT in the last 72 hours. Urine analysis:    Component Value  Date/Time   COLORURINE YELLOW 08/20/2016 0502   APPEARANCEUR CLEAR 08/20/2016 0502   LABSPEC 1.009 08/20/2016 0502   PHURINE 5.5 08/20/2016 0502   GLUCOSEU NEGATIVE 08/20/2016 0502   HGBUR NEGATIVE 08/20/2016 0502   BILIRUBINUR NEGATIVE 08/20/2016 0502   KETONESUR NEGATIVE 08/20/2016 0502   PROTEINUR NEGATIVE 08/20/2016 0502   UROBILINOGEN 0.2 09/30/2011 2349   NITRITE NEGATIVE 08/20/2016 0502   LEUKOCYTESUR NEGATIVE 08/20/2016 0502   Sepsis Labs: @LABRCNTIP (procalcitonin:4,lacticidven:4) )No results found for this or any previous visit (from the past 240 hour(s)).   Radiological Exams on Admission: Dg Chest Port 1 View  Result Date: 09/03/2016 CLINICAL DATA:  Chest pain and  shortness of breath tonight. EXAM: PORTABLE CHEST 1 VIEW COMPARISON:  Radiographs 08/25/2016.  CT 08/20/2016 FINDINGS: Stable cardiomegaly and tortuous atherosclerotic thoracic aorta. There are increased patchy bibasilar opacities. No pulmonary edema. Soft tissue attenuation partially obscures evaluation of the right costophrenic angle, allowing for this, no evidence of pleural effusion. Right shoulder prosthesis. IMPRESSION: Increased patchy bibasilar opacities, may be atelectasis, recommend correlation to exclude aspiration. Stable cardiomegaly and tortuous atherosclerotic thoracic aorta. Electronically Signed   By: Jeb Levering M.D.   On: 09/03/2016 04:24    EKG: (Independently reviewed) sinus rhythm with ventricular rate 63 bpm, QTC 483 ms, new diffuse deep T-wave inversion involving the septal lateral leads as well as subtle ST elevation in the inferior leads which is new from previous EKG last month  Assessment/Plan Principal Problem:   Non-STEMI (non-ST elevated myocardial infarction)  -Patient presents with left shoulder and chest pain with associated elevated troponin and abnormal EKG concerning for ischemia -Findings previously discussed with cardiologist who recommended medical therapies only given  advanced age, dementia and history of frequent falls. Because of advanced chronic kidney disease stage IV she is not a candidate for cardiac catheterization. -HEART SCORE = 10 -Per request of nighttime cardiologist on-call I have recontacted cardiology to assist in management of her acute cardiac issues -Continue to cycle troponin -Antiplatelet with full dose aspirin -IV heparin with pharmacy managing -Continue preadmission beta blocker -0.5 inch nitroglycerin paste every 6 hours -Begin Zocor 20 mg-Chek CMET (baseline LFTs) and check lipid panel -Echocardiogram -Supplemental oxygen  Active Problems:   CKD (chronic kidney disease) stage 4, GFR 15-29 ml/min  -Not a candidate for cardiac catheterization -Avoid offending medications including ACE inhibitors -Discontinue preadmission milk of magnesia    Acute Hyperkalemia -Previously patient was on Lasix with potassium supplementation and Lasix was discontinued at time of discharge on 10/4 -Discontinue exogenous potassium supplementation -Normal saline bolus 250 mL 1 -Repeat electrolyte panel at 2 PM today or with next troponin draw -Review of history reveals patient apparently has a history of hyperaldosteronism     Leukocytosis -Could be reflective of cardiac ischemia -History of recurrent UTIs so check urinalysis and culture- may catheterize for specimen if necessary -CBC in a.m.    Prolonged Q-T interval on ECG -Current QTC high limits of normal -Avoiding Zofran and other agents that can potentially prolong QT -Have held preadmission Aricept and Namenda as well as Zoloft    Essential hypertension -Currently controlled -Continue preadmission Toprol and clonidine    Chronic diastolic heart failure  -Currently compensated    Hypoxia/COPD (chronic obstructive pulmonary disease) with emphysema  -Patient presented with a relative hypoxemia with a resting pulse oximetry of 91% upon presentation -I suspect with any minimal  exertion this patient probably has chronic hypoxemia and may benefit from chronic oxygen therapy upon discharge -May need to explore if assisted living facility can support oxygen therapy otherwise patient may need to discharge to skilled nursing facility    Dementia without behavioral disturbance -Because of recent issues with prolonged QT and current cardiac issues I have held Zoloft and her dementia meds temporarily    Diet-controlled diabetes mellitus -Follow CBGs and provide SSI -Initial glucose >200 -Hemoglobin A1c    Yeast dermatitis -Mycolog cream every 12 hours      DVT prophylaxis: IV heparin full dose  Code Status: DO NOT RESUSCITATE Family Communication: No family at bedside at time of evaluation Disposition Plan: Anticipate discharge back to assisted living facility although may require skilled nursing facility-consider PT/OT  evaluation this admission but noted previous admission home health PT was recommended Consults called: Cardiology/[place] and [facility] on call MD    Jolana Runkles L. ANP-BC [facility] Pager [phone]   If 7PM-7AM, please contact night-coverage [email or web address] Password [facility]  09/03/2016, 7:47 AM

## 2016-09-03 NOTE — Clinical Social Work Note (Signed)
CSW spoke with Tammy at [facility]. Tammy states that pt is not ambulatory at baseline, therefore she does not believe pt will need to be reassessed for return to facility following discharge. CSW will follow up following therapy recommendations and will facilitate discharge once medically stable.  [address], [place]

## 2016-09-03 NOTE — Progress Notes (Signed)
ANTICOAGULATION CONSULT NOTE - Initial Consult  Pharmacy Consult for heparin Indication: chest pain/ACS  Allergies  Allergen Reactions  . Levaquin [Levofloxacin In D5w]     Per MAR  . Penicillins     Per Mar  . Latex Rash    Causes blisters     Patient Measurements: Height: 5\' 2"  (157.5 cm) Weight: 169 lb 15.6 oz (77.1 kg) IBW/kg (Calculated) : 50.1 Heparin Dosing Weight: 67kg  Vital Signs: Temp: 98.4 F (36.9 C) (10/12 0352) Temp Source: Oral (10/12 0352) BP: 130/67 (10/12 0800) Pulse Rate: 61 (10/12 0800)  Labs:  Recent Labs  09/03/16 0410 09/03/16 0715  HGB 11.4*  --   HCT 36.9  --   PLT 260  --   CREATININE 1.87*  --   TROPONINI 1.31* 1.19*    Estimated Creatinine Clearance: 21.9 mL/min (by C-G formula based on SCr of 1.87 mg/dL (H)).   Medical History: Past Medical History:  Diagnosis Date  . Cancer ([place])   . Dementia   . Fibromyalgia   . Hyperaldosteronism ([place])   . Hypertension   . Hypokalemia   . Renal disorder    CKD  . Vitamin D deficiency     Medications:  Infusions:  . heparin      Assessment: 60 yof presented to the ED with shoulder pain. Troponin found to be elevated. Hemoglobin is slightly low but platelets are WNL. She is not on anticoagulation PTA.   Goal of Therapy:  Heparin level 0.3-0.7 units/ml Monitor platelets by anticoagulation protocol: Yes   Plan:  - Heparin bolus 4000 units IV x 1 - Heparin gtt 800 units/hr - Check an 8 hr heparin level - Daily heparin level and CBC  Benita Boonstra, Rande Lawman 09/03/2016,8:24 AM

## 2016-09-03 NOTE — Consult Note (Signed)
Patient ID: Kaylee Turner MRN: [id number], DOB/AGE: 80-10-1931   Admit date: 09/03/2016   Reason for Consult: NSTEMI  Requesting Provider: Dr. Aggie Moats, Internal Medicine    Primary Physician: No primary care provider on file. Primary Cardiologist: New ([place])   Pt. Profile:  82 y/o female, who resides at an ALF, with h/o dementia, COPD, prior h/o breast cancer, hypertensive heart disease, chronic diastolic dysfunction, HTN, stage IV CKD, hyperaldosteronism, frequent falls and recurrent UTIs, who presented from facility with complaint of chest and left shoulder pain. Found to have elevated troponins, peaking at 1.31 and an abnormal EKG with new septal and lateral TWIs. Also with recurrent UTI, receiving antibiotics.   Problem List  Past Medical History:  Diagnosis Date  . Cancer ([place])   . Dementia   . Fibromyalgia   . Hyperaldosteronism ([place])   . Hypertension   . Hypokalemia   . Renal disorder    CKD  . Vitamin D deficiency     Past Surgical History:  Procedure Laterality Date  . BREAST SURGERY  RIGHT LUMPECTOMY     Allergies  Allergies  Allergen Reactions  . Levaquin [Levofloxacin In D5w]     Per MAR  . Penicillins     Per Mar  . Latex Rash    Causes blisters     HPI  82 y/o female, who resides at an ALF, with h/o dementia, COPD, prior h/o breast cancer, hypertensive heart disease, chronic diastolic dysfunction, HTN, stage IV CKD, hyperaldosteronism, frequent falls and recurrent UTIs. She was recently admitted, earlier in the month, for multiple issues- Recurrent falls, COPD with hypoxemia, dehydration prompting discontinuation of Lasix and acute kidney injury. She was discharged back to ALF on 08/26/16. On 08/27/16, she had another fall and was seen in the ED but released back to ALF the same day.  She presented back to ED early this am with complaint of left shoulder and chest pain pain.  EKG in the ED showed T-wave inversions in septal lateral leads with  subtle ST elevation in inferior leads, new from previous EKGs. Initial Troponin was elevated at 1.31. CBC showed leukocytosis. WBC was 15.5.  She was afebrile. BMP suggested acute on chronic kidney disease with SCr at 1.87, previously 1.2, 10 days ago (1.75 at time of discharge 08/26/16).  CXR showed increased patchy bibasilar opacities. ? Atelectasis vs aspiration. UA + for UTI. She was given ASA in ED. Given multiple medical issues, IM has admitted patient to telemetry unit. Her 2nd troponin was 1.19. Cardiology consulted for further recommendations.   She is currently sleeping and appears comfortable, in no acute distress. She is on supplemental O2 w/o labored breathing. BP is stable at 132/70. Telemetry shows sinus brady with HR in the mid 50s. Her sister is present at her bedside. She notes patient has not complained of any pain to her since she has been present. Her sister notes that her dementia appears to be progressing. She primary uses a wheel chair to get around but is able to ambulate short distances with use of a walker. She has had multiple falls recently. She is a former smoker but quit many years ago. Her father had a MI but late in life, in his 53s. Her mother had mitral valve repair. She had 4 children. 3 are deceased. [place]   Home Medications  Prior to Admission medications   Medication Sig Start Date End Date Taking? Authorizing Provider  ALPRAZolam Duanne Moron)  0.25 MG tablet Take 0.25 mg by mouth every 8 (eight) hours as needed for anxiety.    Yes Historical Provider, MD  alum & mag hydroxide-simeth (MINTOX) 200-200-20 MG/5ML suspension Take 30 mLs by mouth as needed for indigestion or heartburn. No more than 4 doses per 24 hours   Yes Historical Provider, MD  Cholecalciferol (VITAMIN D-3) 1000 UNITS CAPS Take 1,000 Units by mouth daily.    Yes Historical Provider, MD  cloNIDine (CATAPRES) 0.3 MG tablet Take 0.3 mg by mouth 2 (two) times daily.   Yes  Historical Provider, MD  diphenhydrAMINE-zinc acetate (BENADRYL) cream Apply topically 3 (three) times daily as needed for itching. 08/26/16  Yes Barton Dubois, MD  guaifenesin (ROBITUSSIN) 100 MG/5ML syrup Take 200 mg by mouth every 6 (six) hours as needed for cough.   Yes Historical Provider, MD  hydrocortisone cream 1 % Apply topically 3 (three) times daily. 08/26/16  Yes Barton Dubois, MD  lamoTRIgine (LAMICTAL) 25 MG tablet Take 50 mg by mouth daily.   Yes Historical Provider, MD  loperamide (IMODIUM) 2 MG capsule Take 2 mg by mouth as needed for diarrhea or loose stools. No more than 8 doses in 24 hrs   Yes Historical Provider, MD  magnesium hydroxide (MILK OF MAGNESIA) 400 MG/5ML suspension Take 30 mLs by mouth at bedtime as needed for mild constipation.   Yes Historical Provider, MD  Memantine HCl-Donepezil HCl (NAMZARIC) 28-10 MG CP24 Take 1 tablet by mouth daily.    Yes Historical Provider, MD  metoprolol succinate (TOPROL-XL) 25 MG 24 hr tablet Take 1 tablet (25 mg total) by mouth at bedtime. 08/26/16  Yes Barton Dubois, MD  potassium chloride SA (K-DUR,KLOR-CON) 20 MEQ tablet Take 20 mEq by mouth 2 (two) times daily.   Yes Historical Provider, MD  sennosides-docusate sodium (SENOKOT-S) 8.6-50 MG tablet Take 1 tablet by mouth at bedtime.   Yes Historical Provider, MD  sertraline (ZOLOFT) 100 MG tablet Take 100 mg by mouth daily.    Yes Historical Provider, MD  Ipratropium-Albuterol (COMBIVENT RESPIMAT) 20-100 MCG/ACT AERS respimat Inhale 1 puff into the lungs every 6 (six) hours as needed for wheezing or shortness of breath. 08/26/16   Barton Dubois, [place]  . [START ON 09/04/2016] aspirin EC  325 mg Oral Daily  . cefTRIAXone (ROCEPHIN)  IV  1 g Intravenous Q24H  . cholecalciferol  1,000 Units Oral Daily  . cloNIDine  0.3 mg Oral BID  . insulin aspart  0-5 Units Subcutaneous QHS  . insulin aspart  0-9 Units Subcutaneous TID WC  . lamoTRIgine  50 mg Oral Daily  . metoprolol  succinate  25 mg Oral QHS  . nitroGLYCERIN  0.5 inch Topical Q6H  . nystatin-triamcinolone   Topical BID  . senna-docusate  1 tablet Oral QHS  . simvastatin  20 mg Oral q1800    Family History  Family History  Problem Relation Age of Onset  . Crohn's disease Daughter   . COPD Daughter     Social History  Social History   Social History  . Marital status: Widowed    Spouse name: N/A  . Number of children: N/A  . Years of education: N/A   Occupational History  . Not on file.   Social History Main Topics  . Smoking status: Former Research scientist (life sciences)  . Smokeless tobacco: Never Used  . Alcohol use No  . Drug use: No  . Sexual activity: No   Other Topics Concern  . Not  on file   Social History Narrative  . No narrative on file     Review of Systems General:  No chills, fever, night sweats or weight changes.  Cardiovascular:  + chest pain, no dyspnea on exertion, edema, orthopnea, palpitations, paroxysmal nocturnal dyspnea. Dermatological: No rash, lesions/masses Respiratory: No cough, dyspnea Urologic: No hematuria, dysuria Abdominal:   No nausea, vomiting, diarrhea, bright red blood per rectum, melena, or hematemesis Neurologic:  No visual changes, wkns, changes in mental status. All other systems reviewed and are otherwise negative except as noted above.  Physical Exam  Blood pressure 132/70, pulse 66, temperature 99.9 F (37.7 C), temperature source Oral, resp. rate 23, height 5\' 2"  (1.575 m), weight 180 lb 14.4 oz (82.1 kg), SpO2 99 %.  General: sleeping, NAD, on supplemental O2, elderly  Psych: Normal affect. Neuro: dementia at baseline  HEENT: Normal  Neck: Supple without bruits or JVD. Lungs:  Resp regular and unlabored, CTA. Heart: RRR no s3, s4, or murmurs. Abdomen: Soft, non-tender, non-distended, BS + x 4.  Extremities: No clubbing, cyanosis or edema. DP/PT/Radials 2+ and equal bilaterally.  Labs  Troponin (Point of Care Test) No results for input(s):  TROPIPOC in the last 72 hours.  Recent Labs  09/03/16 0410 09/03/16 0715  TROPONINI 1.31* 1.19*   Lab Results  Component Value Date   WBC 15.5 (H) 09/03/2016   HGB 11.4 (L) 09/03/2016   HCT 36.9 09/03/2016   MCV 94.4 09/03/2016   PLT 260 09/03/2016    Recent Labs Lab 09/03/16 0410  NA 138  K 5.6*  CL 108  CO2 22  BUN 25*  CREATININE 1.87*  CALCIUM 9.2  GLUCOSE 207*   No results found for: CHOL, HDL, LDLCALC, TRIG Lab Results  Component Value Date   DDIMER 1.35 (H) 08/20/2016     Radiology/Studies  Dg Chest 2 View  Result Date: 08/25/2016 CLINICAL DATA:  Fever EXAM: CHEST  2 VIEW COMPARISON:  08/20/2016 FINDINGS: There is cardiomegaly. Bibasilar atelectasis. Trace bilateral effusions. No overt edema. No acute bony abnormality. IMPRESSION: Cardiomegaly with bibasilar atelectasis and trace effusions. Electronically Signed   By: Rolm Baptise M.D.   On: 08/25/2016 12:16   Dg Chest 2 View  Result Date: 08/20/2016 CLINICAL DATA:  82 year old female with hypoxia and shortness of breath. EXAM: CHEST  2 VIEW COMPARISON:  Chest radiograph dated 10/01/2011 FINDINGS: Two views of the chest demonstrates emphysematous changes of the lungs. There bibasilar linear atelectasis/ scarring. Infiltrate is less likely but not excluded. Clinical correlation is recommended. No pleural effusion or pneumothorax. There is stable mild cardiomegaly. There is osteopenia with degenerative changes of the spine. Right shoulder hemiarthroplasty. No acute fracture. IMPRESSION: Bibasilar linear atelectasis/ scarring versus less likely infiltrate. Clinical correlation is recommended. Electronically Signed   By: Anner Crete M.D.   On: 08/20/2016 02:15   Ct Head Wo Contrast  Result Date: 08/27/2016 CLINICAL DATA:  Unwitnessed fall while coming out of bathroom. Possible head injury. History of dementia, hypertension and breast cancer. EXAM: CT HEAD WITHOUT CONTRAST TECHNIQUE: Contiguous axial images were  obtained from the base of the skull through the vertex without intravenous contrast. COMPARISON:  CT HEAD Apr 02, 2016 FINDINGS: BRAIN: The ventricles and sulci are normal for age, cavum septum pellucidum. No intraparenchymal hemorrhage, mass effect nor midline shift. Patchy to confluent supratentorial white matter hypodensities. No acute large vascular territory infarcts. No abnormal extra-axial fluid collections. Basal cisterns are patent. VASCULAR: Moderate calcific atherosclerosis of the carotid siphons. SKULL: No  skull fracture. Residual small RIGHT frontal scalp hematoma. SINUSES/ORBITS: Persistent RIGHT sphenoid sinus opacification. Mastoid air cells are well aerated. Debris within the external auditory canals most compatible with cerumen. The included ocular globes and orbital contents are non-suspicious. OTHER: None. IMPRESSION: No acute intracranial process. Small resolving RIGHT frontal scalp hematoma.  No skull fracture. Involutional changes. Moderate to severe chronic small vessel ischemic disease. Electronically Signed   By: Elon Alas M.D.   On: 08/27/2016 05:06   Ct Angio Chest Pe W Or Wo Contrast  Result Date: 08/20/2016 CLINICAL DATA:  82 year old female with hypoxia and elevated D-dimer. EXAM: CT ANGIOGRAPHY CHEST WITH CONTRAST TECHNIQUE: Multidetector CT imaging of the chest was performed using the standard protocol during bolus administration of intravenous contrast. Multiplanar CT image reconstructions and MIPs were obtained to evaluate the vascular anatomy. CONTRAST:  80 cc Isovue 370 COMPARISON:  Chest radiograph dated 08/20/2016 FINDINGS: There is emphysematous changes of the lungs. Right lung base linear atelectasis/scarring. No focal consolidation, pleural effusion, or pneumothorax. The central airways are patent. Advanced atherosclerotic calcification of the thoracic aorta. No aneurysmal dilatation or evidence of dissection. Evaluation of the pulmonary arteries is somewhat  limited due to respiratory motion artifact and suboptimal opacification and visualization of the distal branches. No central pulmonary artery embolus identified. There is no cardiomegaly or pericardial effusion. Advanced coronary vascular calcification. No hilar or mediastinal adenopathy identified. Esophagus is grossly unremarkable. Bilateral calcified thyroid nodules noted. There is no axillary adenopathy. Left breast implant. There is osteopenia with degenerative changes of the spine. No acute fracture. The partially visualized 2 cm hypodense lesion in the region of the left kidney, incompletely characterized but possibly a renal cyst. Further evaluation with nonemergent renal ultrasound recommended. There is an 8 mm left adrenal adenoma. The visualized upper abdomen is otherwise unremarkable. IMPRESSION: No acute intrathoracic pathology. No CT evidence of central pulmonary artery embolus. Advanced atherosclerotic calcification of the thoracic aorta. No dissection. Emphysema.  No focal consolidation. Small left adrenal adenoma and a partially visualized probable left renal upper pole cyst. Further evaluation with nonemergent renal ultrasound recommended. Electronically Signed   By: Anner Crete M.D.   On: 08/20/2016 06:40   Dg Chest Port 1 View  Result Date: 09/03/2016 CLINICAL DATA:  Chest pain and shortness of breath tonight. EXAM: PORTABLE CHEST 1 VIEW COMPARISON:  Radiographs 08/25/2016.  CT 08/20/2016 FINDINGS: Stable cardiomegaly and tortuous atherosclerotic thoracic aorta. There are increased patchy bibasilar opacities. No pulmonary edema. Soft tissue attenuation partially obscures evaluation of the right costophrenic angle, allowing for this, no evidence of pleural effusion. Right shoulder prosthesis. IMPRESSION: Increased patchy bibasilar opacities, may be atelectasis, recommend correlation to exclude aspiration. Stable cardiomegaly and tortuous atherosclerotic thoracic aorta. Electronically  Signed   By: Jeb Levering M.D.   On: 09/03/2016 04:24    ECG  NSR with new deep TWIs in anterolateral leads.    ASSESSMENT AND PLAN  Principal Problem:   Non-STEMI (non-ST elevated myocardial infarction) ([place]) Active Problems:   Essential hypertension   CKD (chronic kidney disease) stage 4, GFR 15-29 ml/min ([facility])   Dementia without behavioral disturbance   Chronic diastolic heart failure ([facility])   Diet-controlled diabetes mellitus ([facility])   Hypoxia   COPD (chronic obstructive pulmonary disease) with emphysema ([facility])   Leukocytosis   Prolonged Q-T interval on ECG   Yeast dermatitis   Acute hyperkalemia   UTI (urinary tract infection)   1. NSTEMI: Per report, patient was transferred from ALF due to chest and left  shoulder pain. Presentation and objective findings are c/w likely ACS/ NSTEMI. Troponin peaked at 1.31. EKG shows new deep TWIs in the anterolateral/ septal leads, not present on prior EKGs. She is resting comfortably and does not appear to be in any acute distress. She has dementia at baseline as well as Stage IV CKD. Per family report, she has poor overall functional status. She resides at ALF and is dependent, primarily, on use of a wheel chair to get around. She is high fall risk. Given her age, poor functional status, an other comorbidities, recommend medical management for presumed CAD. Continue IV heparin. Treat with ASA, BB and statin. She is currently on metoprolol XL, 25 mg daily. Monitor HR on telemetry given bradycardia. Check 2D echo to assess LVF and wall motion. MD to assess and will provide further recommendations.  2. Acute on Chronic Kidney Disease: Stage IV. SCr 1.87. This is up from previous admission last week when SCr was 1.75. Will try to avoid cath if possible.   3. HTN: reported h/o hyperaldosteronism. BP is currently stable. Continue metoprolol and clonidine. No ACE/ARB given CKD.   4. Bradycardia: on BB and clonidine for HTN, both of which can cause  bradycardia. HR currently in the mid 23s. Monitor on telemetry.   5. Chronic Diastolic HF: volume appears stable. Check 2D echo to reassess LVF, given presumed ACS.   6. Dementia: per IM  7. Fall Risk: high. Recent recurrent falls.   8. UTI: h/o recurrent UTIs. WBC elevated at 15. Afebrile. UA +. On antibiotics per IM.    Signed, Lyda Jester, PA-C 09/03/2016, 12:04 PM

## 2016-09-03 NOTE — ED Provider Notes (Signed)
[facility] DEPT Provider Note   CSN: [id number] Arrival date & time: 09/03/16  [id number]  By signing my name below, I, Kaylee Turner, attest that this documentation has been prepared under the direction and in the presence of [name], MD . Electronically Signed: Evelene Turner, Scribe. 09/03/2016. 3:54 AM.    History   Chief Complaint Chief Complaint  Patient presents with  . Shoulder Pain   LEVEL 5 CAVEAT DUE TO DEMENTIA  The history is provided by the patient and the EMS personnel. No language interpreter was used.     HPI Comments:  Kaylee Turner is a 82 y.o. female with a history of dementia  who presents to the Emergency Department via EMS from [facility]. Adequate history was not given from EMS/NH. Her only complaint at this time is shoulder pain. She denies CP and states she is not sure why she was brought to the ED this evening.    Past Medical History:  Diagnosis Date  . Cancer ([place])   . Dementia   . Fibromyalgia   . Hyperaldosteronism ([place])   . Hypertension   . Hypokalemia   . Renal disorder    CKD  . Vitamin D deficiency     Patient Active Problem List   Diagnosis Date Noted  . Hypoxia 08/20/2016  . COPD (chronic obstructive pulmonary disease) with emphysema ([place]) 08/20/2016  . Frequent falls 08/20/2016  . Leukocytosis 08/20/2016  . Prolonged Q-T interval on ECG 08/20/2016  . Pressure injury of skin 08/20/2016  . Diabetes mellitus type 2 in obese ([place]) 12/31/2015  . Recurrent UTI 09/26/2015  . Depression with anxiety 09/26/2015  . Chronic diastolic heart failure ([place]) 08/04/2015  . Hypertensive heart and renal disease with congestive heart failure ([place]) 08/04/2015  . Diabetes mellitus, new onset ([place]) 06/04/2015  . Bilateral edema of lower extremity 06/04/2015  . Depression due to dementia 06/04/2015  . Hypertensive heart disease with heart failure ([place]) 02/11/2015  . Generalized anxiety disorder 02/11/2015  . Malignant neoplasm  of female breast ([place]) 02/11/2015  . Dementia without behavioral disturbance 11/12/2014  . CHF, stage C ([place]) 11/12/2014  . Anxiety state 11/12/2014  . Essential hypertension 09/28/2014  . Chronic kidney disease (CKD) 09/28/2014  . PVD (peripheral vascular disease) ([place]) 09/28/2014  . Hypertensive heart failure ([place]) 09/28/2014  . Primary generalized (osteo)arthritis 09/28/2014  . Dementia, senile 09/28/2014  . Neoplasm of breast 09/28/2014  . Age-related nuclear cataract of left eye 09/28/2014  . Hypokalemia 10/01/2011    Past Surgical History:  Procedure Laterality Date  . BREAST SURGERY  RIGHT LUMPECTOMY    OB History    No data available       Home Medications    Prior to Admission medications   Medication Sig Start Date End Date Taking? Authorizing Provider  acetaminophen (TYLENOL) 500 MG tablet Take 500 mg by mouth every 4 (four) hours as needed for mild pain, fever or headache. DO NOT EXCEED 4 GM OF TYLENOL IN 24 HOURS    Historical Provider, MD  ALPRAZolam (XANAX) 0.25 MG tablet Take 0.25 mg by mouth every 8 (eight) hours as needed for anxiety.     Historical Provider, MD  alum & mag hydroxide-simeth (MINTOX) 200-200-20 MG/5ML suspension Take 30 mLs by mouth as needed for indigestion or heartburn. No more than 4 doses per 24 hours    Historical Provider, MD  Calcium Carb-Cholecalciferol (OYSTER SHELL CALCIUM 500+D PO) Take 1 tablet by mouth daily.     Historical  Provider, MD  Cholecalciferol (VITAMIN D-3) 1000 UNITS CAPS Take 1,000 Units by mouth daily.     Historical Provider, MD  cloNIDine (CATAPRES) 0.3 MG tablet Take 0.3 mg by mouth 2 (two) times daily.    Historical Provider, MD  diphenhydrAMINE-zinc acetate (BENADRYL) cream Apply topically 3 (three) times daily as needed for itching. 08/26/16   Barton Dubois, MD  furosemide (LASIX) 20 MG tablet Take 1 tablet (20 mg total) by mouth daily. 08/26/16   Barton Dubois, MD  guaifenesin (ROBITUSSIN) 100 MG/5ML syrup Take 200  mg by mouth every 6 (six) hours as needed for cough.    Historical Provider, MD  hydrALAZINE (APRESOLINE) 25 MG tablet Take 2 tablets (50 mg total) by mouth 3 (three) times daily. 08/26/16   Barton Dubois, MD  HYDROcodone-acetaminophen (NORCO/VICODIN) 5-325 MG tablet Take 1 tablet by mouth every 6 (six) hours as needed for severe pain. 08/26/16   Barton Dubois, MD  hydrocortisone cream 1 % Apply topically 3 (three) times daily. 08/26/16   Barton Dubois, MD  Ipratropium-Albuterol (COMBIVENT RESPIMAT) 20-100 MCG/ACT AERS respimat Inhale 1 puff into the lungs every 6 (six) hours as needed for wheezing or shortness of breath. 08/26/16   Barton Dubois, MD  lamoTRIgine (LAMICTAL) 25 MG tablet Take 50 mg by mouth daily.    Historical Provider, MD  loperamide (IMODIUM) 2 MG capsule Take 2 mg by mouth as needed for diarrhea or loose stools. No more than 8 doses in 24 hrs    Historical Provider, MD  magnesium hydroxide (MILK OF MAGNESIA) 400 MG/5ML suspension Take 30 mLs by mouth at bedtime as needed for mild constipation.    Historical Provider, MD  Memantine HCl-Donepezil HCl (NAMZARIC) 28-10 MG CP24 Take by mouth daily.    Historical Provider, MD  metoprolol succinate (TOPROL-XL) 25 MG 24 hr tablet Take 1 tablet (25 mg total) by mouth at bedtime. 08/26/16   Barton Dubois, MD  Neomycin-Bacitracin-Polymyxin (TRIPLE ANTIBIOTIC) [phone] OINT Apply 1 application topically as needed (wound care).    Historical Provider, MD  potassium chloride SA (K-DUR,KLOR-CON) 20 MEQ tablet Take 20 mEq by mouth 2 (two) times daily.    Historical Provider, MD  sennosides-docusate sodium (SENOKOT-S) 8.6-50 MG tablet Take 1 tablet by mouth at bedtime.    Historical Provider, MD  sertraline (ZOLOFT) 100 MG tablet Take 100 mg by mouth daily.     Historical Provider, MD    Family History Family History  Problem Relation Age of Onset  . Crohn's disease Daughter   . COPD Daughter     Social History Social History  Substance  Use Topics  . Smoking status: Former Research scientist (life sciences)  . Smokeless tobacco: Never Used  . Alcohol use No     Allergies   Levaquin [levofloxacin in d5w]; Penicillins; and Latex   Review of Systems Review of Systems  Unable to perform ROS: Dementia    Physical Exam Updated Vital Signs There were no vitals taken for this visit.  Physical Exam   CONSTITUTIONAL: Elderly and frail HEAD: Normocephalic/atraumatic EYES: EOMI/PERRL ENMT: Mucous membranes moist NECK: supple no meningeal signs SPINE/BACK:entire spine nontender CV: S1/S2 noted, no murmurs/rubs/gallops noted LUNGS: Lungs are clear to auscultation bilaterally, no apparent distress ABDOMEN: soft, nontender NEURO: Pt is awake/alert/confused, moves all extremitiesx4.  No facial droop.   EXTREMITIES: pulses normal/equal, full ROM SKIN: warm, color normal; healing shingles to left chest wall, no active drainage     ED Treatments / Results  DIAGNOSTIC STUDIES:  Oxygen Saturation is 91%  on RA, normal by my interpretation.     Labs (all labs ordered are listed, but only abnormal results are displayed) Labs Reviewed  CBC WITH DIFFERENTIAL/PLATELET - Abnormal; Notable for the following:       Result Value   WBC 15.5 (*)    Hemoglobin 11.4 (*)    Neutro Abs 13.4 (*)    Monocytes Absolute 1.1 (*)    All other components within normal limits  BASIC METABOLIC PANEL - Abnormal; Notable for the following:    Potassium 5.6 (*)    Glucose, Bld 207 (*)    BUN 25 (*)    Creatinine, Ser 1.87 (*)    GFR calc non Af Amer 24 (*)    GFR calc Af Amer 28 (*)    All other components within normal limits  TROPONIN I - Abnormal; Notable for the following:    Troponin I 1.31 (*)    All other components within normal limits    EKG  EKG Interpretation  Date/Time:  Thursday September 03 2016 04:48:16 EDT Ventricular Rate:  63 PR Interval:    QRS Duration: 104 QT Interval:  471 QTC Calculation: 483 R Axis:   -69 Text Interpretation:   Sinus rhythm Left anterior fascicular block Abnrm T, consider ischemia, anterolateral lds No significant change since last tracing Confirmed by Christy Gentles  MD, [place] ([id number]) on 09/03/2016 4:52:16 AM       Radiology Dg Chest Port 1 View  Result Date: 09/03/2016 CLINICAL DATA:  Chest pain and shortness of breath tonight. EXAM: PORTABLE CHEST 1 VIEW COMPARISON:  Radiographs 08/25/2016.  CT 08/20/2016 FINDINGS: Stable cardiomegaly and tortuous atherosclerotic thoracic aorta. There are increased patchy bibasilar opacities. No pulmonary edema. Soft tissue attenuation partially obscures evaluation of the right costophrenic angle, allowing for this, no evidence of pleural effusion. Right shoulder prosthesis. IMPRESSION: Increased patchy bibasilar opacities, may be atelectasis, recommend correlation to exclude aspiration. Stable cardiomegaly and tortuous atherosclerotic thoracic aorta. Electronically Signed   By: Jeb Levering M.D.   On: 09/03/2016 04:24    Procedures Procedures  CRITICAL CARE Performed by: Sharyon Cable Total critical care time: 33 minutes Critical care time was exclusive of separately billable procedures and treating other patients. Critical care was necessary to treat or prevent imminent or life-threatening deterioration. Critical care was time spent personally by me on the following activities: development of treatment plan with patient and/or surrogate as well as nursing, discussions with consultants, evaluation of patient's response to treatment, examination of patient, obtaining history from patient or surrogate, ordering and performing treatments and interventions, ordering and review of laboratory studies, ordering and review of radiographic studies, pulse oximetry and re-evaluation of patient's condition. PATIENT WITH ABNORMAL EKG AND ELEVATED TROPONIN = NON-STEMI WILL NEED ADMISSION/MONITORING    Medications Ordered in ED Medications  aspirin chewable tablet 324 mg  (324 mg Oral Given 09/03/16 0514)     Initial Impression / Assessment and Plan / ED Course  I have reviewed the triage vital signs and the nursing notes.  Pertinent labs & imaging results that were available during my care of the patient were reviewed by me and considered in my medical decision making (see chart for details).  Clinical Course    Pt here from nursing home for reported chest pain Pt told me she had shoulder pain Her EKG is abnormal with deep T wave inversions Her troponin is >1 Will consult cardiology  5:51 AM D/w dr Irish Lack, he recommends medical admission with cardiology consultation Patient is  a DNR On reassessment, pt is asleep and in no distress She denies any chest pain at this time 6:41 AM D/w dr Hal Hope for admission Pt stable in the ED  Final Clinical Impressions(s) / ED Diagnoses   Final diagnoses:  Non-STEMI (non-ST elevated myocardial infarction) [facility])    New Prescriptions New Prescriptions   No medications on file    I personally performed the services described in this documentation, which was scribed in my presence. The recorded information has been reviewed and is accurate.        [name], MD 09/03/16 [phone]

## 2016-09-03 NOTE — ED Notes (Signed)
Attempted report 

## 2016-09-03 NOTE — ED Triage Notes (Signed)
Per EMS, pt from [facility] with hx of dementia. Pt c/o shoulder pain and rash to her back.

## 2016-09-03 NOTE — Progress Notes (Signed)
Cardiology note reviewed and assistance highly appreciated. Plan is now to focus more on palliative measures and formal palliative consultation has been placed by the cardiology team. Heparin, cardiac studies, lipid profile and nonessential cardiac orders were discontinued by the cardiologist with only the most minimal of medical therapy for her NSTEMI recommended and remaining in place.  Erin Hearing, ANP

## 2016-09-03 NOTE — Progress Notes (Signed)
UA c/w UTI- cx pending- old urine cx reviewed- prior klebsiella resistant to ampicillin and macrodantin, prior e coli resistant to cipro- both have been sensitive to rocephin in the past so will begin this empirically  Erin Hearing, ANP

## 2016-09-03 NOTE — Clinical Social Work Note (Signed)
Clinical Social Work Assessment  Patient Details  Name: Kaylee Turner MRN: [id number] Date of Birth: Apr 01, 1931  Date of referral:  09/03/16               Reason for consult:  Facility Placement                Permission sought to share information with:  Family Supports Permission granted to share information::     Name::     [name]  Agency::     Relationship::  Sister  Contact Information:  [phone]  Housing/Transportation Living arrangements for the past 2 months:  [place] [facility]) Source of Information:  Other (Comment Required) (Sibling, sister) Patient Interpreter Needed:  None Criminal Activity/Legal Involvement Pertinent to Current Situation/Hospitalization:  No - Comment as needed Significant Relationships:  Siblings Lives with:  Other (Comment) (Assisted living) Do you feel safe going back to the place where you live?  Yes Need for family participation in patient care:  Yes (Comment)  Care giving concerns:  CSW spoke with pt's sister [name] via telephone. Pt's sister has no concerns at this time.    Social Worker assessment / plan:  CSW spoke with pt's sister [name] via telephone. Pt was at [facility] prior to admission. Pt's sister states she would like pt to return to [place] if medically stable and physically able at discharge, however pt's sister understands pt may need a SNF placement after PT evaluation. In this case, the pt's sister prefers [facility] as first choice, and [facility] as backup. CSW will follow up with pt's sister once PT assessment is complete.    Employment status:  Retired Nurse, adult PT Recommendations:  Not assessed at this time Information / Referral to community resources:  [place]  Patient/Family's Response to care: Pt's sister verbalized understanding of CSW role and appreciative of support.  Patient/Family's Understanding of and Emotional  Response to Diagnosis, Current Treatment, and Prognosis:  Pt's sister familiar with SNF placement. Pt's sister realistic and understands about pt's physical limitations.   Emotional Assessment Appearance:  Appears stated age Attitude/Demeanor/Rapport:  Unable to Assess Affect (typically observed):  Unable to Assess Orientation:  Oriented to Self Alcohol / Substance use:  Not Applicable Psych involvement (Current and /or in the community):  No (Comment)  Discharge Needs  Concerns to be addressed:  Care Coordination Readmission within the last 30 days:  Yes Current discharge risk:  Dependent with Mobility Barriers to Discharge:  Continued Medical Work up   [employer], LCSW 09/03/2016, 3:21 PM

## 2016-09-04 DIAGNOSIS — I10 Essential (primary) hypertension: Secondary | ICD-10-CM

## 2016-09-04 DIAGNOSIS — Z515 Encounter for palliative care: Secondary | ICD-10-CM

## 2016-09-04 DIAGNOSIS — N3000 Acute cystitis without hematuria: Secondary | ICD-10-CM

## 2016-09-04 LAB — CBC WITH DIFFERENTIAL/PLATELET
Basophils Absolute: 0 10*3/uL (ref 0.0–0.1)
Basophils Relative: 0 %
EOS ABS: 0.1 10*3/uL (ref 0.0–0.7)
EOS PCT: 1 %
HCT: 30.6 % — ABNORMAL LOW (ref 36.0–46.0)
HEMOGLOBIN: 9.3 g/dL — AB (ref 12.0–15.0)
LYMPHS ABS: 2.3 10*3/uL (ref 0.7–4.0)
Lymphocytes Relative: 17 %
MCH: 28.5 pg (ref 26.0–34.0)
MCHC: 30.4 g/dL (ref 30.0–36.0)
MCV: 93.9 fL (ref 78.0–100.0)
MONOS PCT: 11 %
Monocytes Absolute: 1.4 10*3/uL — ABNORMAL HIGH (ref 0.1–1.0)
Neutro Abs: 9.8 10*3/uL — ABNORMAL HIGH (ref 1.7–7.7)
Neutrophils Relative %: 71 %
Platelets: 236 10*3/uL (ref 150–400)
RBC: 3.26 MIL/uL — ABNORMAL LOW (ref 3.87–5.11)
RDW: 15.5 % (ref 11.5–15.5)
WBC: 13.8 10*3/uL — ABNORMAL HIGH (ref 4.0–10.5)

## 2016-09-04 LAB — COMPREHENSIVE METABOLIC PANEL
ALK PHOS: 72 U/L (ref 38–126)
ALT: 26 U/L (ref 14–54)
ANION GAP: 6 (ref 5–15)
AST: 21 U/L (ref 15–41)
Albumin: 2.8 g/dL — ABNORMAL LOW (ref 3.5–5.0)
BUN: 27 mg/dL — ABNORMAL HIGH (ref 6–20)
CALCIUM: 8.7 mg/dL — AB (ref 8.9–10.3)
CO2: 24 mmol/L (ref 22–32)
Chloride: 106 mmol/L (ref 101–111)
Creatinine, Ser: 1.69 mg/dL — ABNORMAL HIGH (ref 0.44–1.00)
GFR calc non Af Amer: 27 mL/min — ABNORMAL LOW (ref >60)
GFR, EST AFRICAN AMERICAN: 31 mL/min — AB (ref >60)
Glucose, Bld: 116 mg/dL — ABNORMAL HIGH (ref 65–99)
Potassium: 5 mmol/L (ref 3.5–5.1)
SODIUM: 136 mmol/L (ref 135–145)
TOTAL PROTEIN: 5.7 g/dL — AB (ref 6.5–8.1)
Total Bilirubin: 0.6 mg/dL (ref 0.3–1.2)

## 2016-09-04 LAB — GLUCOSE, CAPILLARY
GLUCOSE-CAPILLARY: 100 mg/dL — AB (ref 65–99)
GLUCOSE-CAPILLARY: 92 mg/dL (ref 65–99)
Glucose-Capillary: 116 mg/dL — ABNORMAL HIGH (ref 65–99)
Glucose-Capillary: 112 mg/dL — ABNORMAL HIGH (ref 65–99)

## 2016-09-04 LAB — HEMOGLOBIN A1C
HEMOGLOBIN A1C: 5.9 % — AB (ref 4.8–5.6)
Mean Plasma Glucose: 123 mg/dL

## 2016-09-04 LAB — PHOSPHORUS: PHOSPHORUS: 2.9 mg/dL (ref 2.5–4.6)

## 2016-09-04 LAB — UREA NITROGEN, URINE: Urea Nitrogen, Ur: 578 mg/dL

## 2016-09-04 LAB — MAGNESIUM: MAGNESIUM: 2.3 mg/dL (ref 1.7–2.4)

## 2016-09-04 NOTE — Progress Notes (Signed)
PROGRESS NOTE    Kaylee Turner  [id number] DOB: Aug 10, 1931 DOA: 09/03/2016 PCP: No primary care provider on file.   Brief Narrative: Kaylee Astacio Copelandis a 82 y.o.femalewith a Past Medical History of HTN. dementia, hyper aldosteronism, FM, CKD, PVD, DM, COPD and cancerwho presents with L shoulder pain. Found to have an elevated trop with new EKG changes in the anterolateral lead and diagnosed with NSTEMI. Cardiology consulted and recommened Palliative Care. Patient also has UTI being treated with IV Ceftriaxone.   Assessment & Plan:   Principal Problem:   Non-STEMI (non-ST elevated myocardial infarction) ([place]) Active Problems:   Essential hypertension   CKD (chronic kidney disease) stage 4, GFR 15-29 ml/min (HCC)   Dementia without behavioral disturbance   Chronic diastolic heart failure (HCC)   Diet-controlled diabetes mellitus (HCC)   Hypoxia   COPD (chronic obstructive pulmonary disease) with emphysema (H[facility])   Leukocytosis   Prolonged Q-T interval on ECG   Yeast dermatitis   Acute hyperkalemia   UTI (urinary tract infection)   Non-STEMI (non-ST elevated myocardial infarction)  -Patient presented with left shoulder and chest pain with associated elevated troponin that peaked at 1.31 and abnormal EKG concerning for ischemia -Findings previously discussed with Cardiologist who recommended medical therapies only given advanced age, dementia and history of frequent falls. Because of advanced chronic kidney disease stage IV she is not a candidate for cardiac catheterization. -HEART SCORE = 10 -Patient was on IV Heparin but D/C'd as Plan was more for Palliative Measures -Continue with ASA 81 mg, Metoprolol XL 25 mg, Morphine 2 mg IV q2hprn for Cp, NG 2% ointment, and Simvastatin 20 mg  -Will need PT/OT to Eval and Treat -Heparin, Cardiac Studies, Lipid profile and nonessential cardiac orders were discontinued by the cardiologist with only the most minimal of medical  therapy for her NSTEMI recommended and remaining in place -Palliative Care Team Consulted and appreciate Recc's  CKD (chronic kidney disease) stage 4, GFR 15-29 ml/min  -Not a candidate for cardiac catheterization -Avoid offending medications including ACE inhibitors -BUN/Cr was 27/1.69 Respectively.   Acute Hyperkalemia, improved -Initial Value was 5.6 -Previously patient was on Lasix with potassium supplementation and Lasix was discontinued at time of discharge on 10/4 -Discontinued exogenous potassium supplementation -Review of history reveals patient apparently has a history of hyperaldosteronism  -Repeat K+ was 5.0   UTI poA -Has Hx of Prior Klebsiella -Leukocytosis Trending Down to 13.8 from 15.5 -C/w IV Ceftriaxone 1 gram -Continue to Monitor  Hypertension 2/2 to Hyperaldosteronism -Currently controlled -Continue to Monitor -On Clonidine 0.3 and Toprolol XL 25 mg  Chronic diastolic Heart Failure  -Currently compensated and not in Exacerbation.   Dementia  -Because of recent issues with prolonged QT and current cardiac issues Admitting team held her Zoloft and her dementia meds temporarily -C/w Lamotrigine for Mood  Diet-controlled diabetes mellitus -Follow CBGs and provide SSI Sensitive AC and HS -Hemoglobin A1c  Yeast Dermatitis -Mycolog cream every 12 hours  COPD -Currently not in Exacerbation   DVT prophylaxis: SCDs Code Status: DNR Family Communication: No family at bedside during Encounter  Disposition Plan: Pending PT Eval  Consultants:   Cardiology  Palliative Care  Procedures: None  Antimicrobials: IV Ceftriaxone  Subjective: Patient was seen and examined at bedside and she was laying in bed answering questions appropriately. Denied Current Cp.    Objective: Vitals:   09/04/16 0006 09/04/16 0541 09/04/16 0755 09/04/16 1130  BP: 126/63 (!) 91/51 91/61 115/72  Pulse: 60 84 (!) 53 Marland Kitchen)  55  Resp: 16 15 16 16   Temp: 98.5 F (36.9  C) 98 F (36.7 C) 97.7 F (36.5 C) 98.5 F (36.9 C)  TempSrc: Oral Axillary Oral Oral  SpO2: 97% 91% 98% 97%  Weight:  82.2 kg (181 lb 4.8 oz)    Height:        Intake/Output Summary (Last 24 hours) at 09/04/16 1402 Last data filed at 09/03/16 1819  Gross per 24 hour  Intake           144.13 ml  Output                0 ml  Net           144.13 ml   Filed Weights   09/03/16 0800 09/03/16 0840 09/04/16 0541  Weight: 77.1 kg (169 lb 15.6 oz) 82.1 kg (180 lb 14.4 oz) 82.2 kg (181 lb 4.8 oz)    Examination: Physical Exam:  Constitutional: WN/WD obese, NAD and appears calm and comfortable Eyes:  Lids and conjunctivae normal, sclerae anicteric  ENMT: External Ears, Nose appear normal. Grossly normal hearing. Mucous membranes are moist.  Neck: Appears normal, supple, no cervical masses, normal ROM, no appreciable thyromegaly, no JVD Respiratory: Clear to auscultation bilaterally, no wheezing, rales, rhonchi or crackles. Normal respiratory effort and patient is not tachypenic. No accessory muscle use.  Cardiovascular: RRR, no murmurs / rubs / gallops. S1 and S2 auscultated. No extremity edema. 2+ pedal pulses. No carotid bruits.  Abdomen: Soft, non-tender, non-distended. No masses palpated. No appreciable hepatosplenomegaly. Bowel sounds positive x4.  GU: Deferred. Musculoskeletal: No clubbing / cyanosis of digits/nails. No joint deformity upper and lower extremities. Nocontractures.  Skin: No rashes, lesions, ulcers on limited skin eval.  Neurologic: CN 2-12 grossly intact with no focal deficits. Sensation intact in all 4 Extremities, Strength 5/5 in all 4. Romberg sign cerebellar reflexes not assessed.  Psychiatric: Difficult to assess because of Dementia.   Data Reviewed: I have personally reviewed following labs and imaging studies  CBC:  Recent Labs Lab 09/03/16 0410 09/04/16 0309  WBC 15.5* 13.8*  NEUTROABS 13.4* 9.8*  HGB 11.4* 9.3*  HCT 36.9 30.6*  MCV 94.4 93.9    PLT 260 [id number]   Basic Metabolic Panel:  Recent Labs Lab 09/03/16 0410 09/03/16 1338 09/04/16 0309  NA 138 136 136  K 5.6* 5.6* 5.0  CL 108 107 106  CO2 22 23 24   GLUCOSE 207* 159* 116*  BUN 25* 27* 27*  CREATININE 1.87* 1.84* 1.69*  CALCIUM 9.2 8.8* 8.7*  MG  --  2.3 2.3  PHOS  --  3.2 2.9   GFR: Estimated Creatinine Clearance: 25 mL/min (by C-G formula based on SCr of 1.69 mg/dL (H)). Liver Function Tests:  Recent Labs Lab 09/04/16 0309  AST 21  ALT 26  ALKPHOS 72  BILITOT 0.6  PROT 5.7*  ALBUMIN 2.8*   No results for input(s): LIPASE, AMYLASE in the last 168 hours. No results for input(s): AMMONIA in the last 168 hours. Coagulation Profile: No results for input(s): INR, PROTIME in the last 168 hours. Cardiac Enzymes:  Recent Labs Lab 09/03/16 0410 09/03/16 0715 09/03/16 1338  TROPONINI 1.31* 1.19* 0.82*   BNP (last 3 results) No results for input(s): PROBNP in the last 8760 hours. HbA1C:  Recent Labs  09/03/16 0715  HGBA1C 5.9*   CBG:  Recent Labs Lab 09/03/16 1328 09/03/16 1611 09/03/16 1954 09/04/16 0738 09/04/16 1128  GLUCAP 167* 139* 104* 100* 116*  Lipid Profile: No results for input(s): CHOL, HDL, LDLCALC, TRIG, CHOLHDL, LDLDIRECT in the last 72 hours. Thyroid Function Tests: No results for input(s): TSH, T4TOTAL, FREET4, T3FREE, THYROIDAB in the last 72 hours. Anemia Panel: No results for input(s): VITAMINB12, FOLATE, FERRITIN, TIBC, IRON, RETICCTPCT in the last 72 hours. Sepsis Labs: No results for input(s): PROCALCITON, LATICACIDVEN in the last 168 hours.  Recent Results (from the past 240 hour(s))  Urine culture     Status: Abnormal (Preliminary result)   Collection Time: 09/03/16 10:48 AM  Result Value Ref Range Status   Specimen Description URINE, CATHETERIZED  Final   Special Requests NONE  Final   Culture >=100,000 COLONIES/mL ESCHERICHIA COLI (A)  Final   Report Status PENDING  Incomplete    Radiology Studies: Dg  Chest Port 1 View  Result Date: 09/03/2016 CLINICAL DATA:  Chest pain and shortness of breath tonight. EXAM: PORTABLE CHEST 1 VIEW COMPARISON:  Radiographs 08/25/2016.  CT 08/20/2016 FINDINGS: Stable cardiomegaly and tortuous atherosclerotic thoracic aorta. There are increased patchy bibasilar opacities. No pulmonary edema. Soft tissue attenuation partially obscures evaluation of the right costophrenic angle, allowing for this, no evidence of pleural effusion. Right shoulder prosthesis. IMPRESSION: Increased patchy bibasilar opacities, may be atelectasis, recommend correlation to exclude aspiration. Stable cardiomegaly and tortuous atherosclerotic thoracic aorta. Electronically Signed   By: Jeb Levering M.D.   On: 09/03/2016 04:24   Scheduled Meds: . aspirin EC  81 mg Oral Daily  . cefTRIAXone (ROCEPHIN)  IV  1 g Intravenous Q24H  . cholecalciferol  1,000 Units Oral Daily  . cloNIDine  0.3 mg Oral BID  . insulin aspart  0-5 Units Subcutaneous QHS  . insulin aspart  0-9 Units Subcutaneous TID WC  . lamoTRIgine  50 mg Oral Daily  . metoprolol succinate  25 mg Oral QHS  . nitroGLYCERIN  0.5 inch Topical Q6H  . nystatin-triamcinolone   Topical BID  . senna-docusate  1 tablet Oral QHS  . simvastatin  20 mg Oral q1800   Continuous Infusions:    LOS: 1 day   Kerney Elbe, DO [facility] Pager [phone]  If 7PM-7AM, please contact night-coverage [email or web address] Password [email or web address] 09/04/2016, 2:02 PM

## 2016-09-04 NOTE — Consult Note (Signed)
Consultation Note Date: 09/04/2016   Patient Name: Kaylee Turner  DOB: 1931-09-11  MRN: [id number]  Age / Sex: 82 y.o., female  PCP: No primary care provider on file. Referring Physician: Kerney Elbe, DO  Reason for Consultation: Establishing goals of care, Non pain symptom management, Pain control and Psychosocial/spiritual support  HPI/Patient Profile: 82 y.o. female  with past medical history of Moderate to severe dementia, hypertension, hyper aldosteronism, fibromyalgia, chronic kidney disease stage IV, peripheral vascular disease, diabetes, COPD, history of breast cancer, diastolic heart failure, admitted on 09/03/2016 with elevated troponin and new EKG changes. Cardiology was consulted and advised medical management. She was on heparin but this is since been stopped. She is not a candidate for anticoagulation because of recent falls. Patient is not a candidate for cardiac catheter because of renal failure..   Clinical Assessment and Goals of Care: Patient is alert and pleasantly confused. She is eating only bites and sips and has to be fed at this point. She recognizes that she has been confused and does know that she is in the hospital but not clear that she has had a heart attack. I met with her sister [name], who is her healthcare power of attorney and her granddaughter, Dicky Doe, to discuss goals of care. Patient had 4 children but  has only one living son who is not involved in her care. Prior to this patient had been living in a memory care unit, assisted living level of care, because of her habit of wandering and confusion. Her family tells me that in the past they have used skilled level of nursing.  Sister Sharin Grave is her HCPOA    SUMMARY OF RECOMMENDATIONS   DNR/DNI If patient's renal function was to worsen she would not want hemodialysis If patient's ability to swallow was  permanently impaired secondary to her dementia she would not want a PEG tube Family is prepared that she may need a higher level of care such as a skilled nursing facility In the past they have been told by skilled nursing facilities that because of patient's wandering and confusion that they were unable to care for her what she was transferred to a memory care unit, assisted living level of care. We may need a physical therapy consult to ascertain what her functional status is at this point If patient meets skilled need, plan is to go to a skilled nursing facility with rehabilitation days and then convert to hospice in the facility Code Status/Advance Care Planning:  DNR    Symptom Management:   Pain: Morphine 2 mg IV every 4 hours as needed. Could convert this to morphine concentrate prior to discharge for an oral alternative that could be used in the facility  Dyspnea: Continue with nebulizer treatments, targeted pulmonary treatments such as oxygen and opioids for severe dyspnea. Continue morphine IV in the hospital and again morphine concentrate for alternative in the facility  Anxiety: Continue Xanax 0.25 mg orally every 6 hours as needed  Palliative Prophylaxis:   Aspiration,  Bowel Regimen, Delirium Protocol, Frequent Pain Assessment, Oral Care and Turn Reposition  Additional Recommendations (Limitations, Scope, Preferences):  Avoid Hospitalization, Minimize Medications, Initiate Comfort Feeding, No Artificial Feeding, No Chemotherapy, No Hemodialysis, No Surgical Procedures and No Tracheostomy  Psycho-social/Spiritual:   Desire for further Chaplaincy support:no  Additional Recommendations: Referral to Community Resources   Prognosis:   < 6 months in the setting of advanced dementia, comorbidities of chronic kidney disease stage IV, diastolic heart failure, COPD, new non-STEMI (not on anticoagulation) not a cardiac cath candidate  Discharge Planning: [place]  for rehab with Palliative care service follow-up      Primary Diagnoses: Present on Admission: . Non-STEMI (non-ST elevated myocardial infarction) ([place]) . Prolonged Q-T interval on ECG . Leukocytosis . Essential hypertension . Dementia without behavioral disturbance . COPD (chronic obstructive pulmonary disease) with emphysema ([place]) . Chronic diastolic heart failure ([place]) . CKD (chronic kidney disease) stage 4, GFR 15-29 ml/min (HCC) . Hypoxia . Yeast dermatitis . Acute hyperkalemia . UTI (urinary tract infection)   I have reviewed the medical record, interviewed the patient and family, and examined the patient. The following aspects are pertinent.  Past Medical History:  Diagnosis Date  . Cancer ([place])   . Dementia   . Fibromyalgia   . Hyperaldosteronism ([place])   . Hypertension   . Hypokalemia   . Renal disorder    CKD  . Vitamin D deficiency    Social History   Social History  . Marital status: Widowed    Spouse name: N/A  . Number of children: N/A  . Years of education: N/A   Social History Main Topics  . Smoking status: Former Research scientist (life sciences)  . Smokeless tobacco: Never Used  . Alcohol use No  . Drug use: No  . Sexual activity: No   Other Topics Concern  . None   Social History Narrative  . None   Family History  Problem Relation Age of Onset  . Crohn's disease Daughter   . COPD Daughter    Scheduled Meds: . aspirin EC  81 mg Oral Daily  . cefTRIAXone (ROCEPHIN)  IV  1 g Intravenous Q24H  . cholecalciferol  1,000 Units Oral Daily  . cloNIDine  0.3 mg Oral BID  . insulin aspart  0-5 Units Subcutaneous QHS  . insulin aspart  0-9 Units Subcutaneous TID WC  . lamoTRIgine  50 mg Oral Daily  . metoprolol succinate  25 mg Oral QHS  . nitroGLYCERIN  0.5 inch Topical Q6H  . nystatin-triamcinolone   Topical BID  . senna-docusate  1 tablet Oral QHS  . simvastatin  20 mg Oral q1800   Continuous Infusions:  PRN Meds:.acetaminophen, ALPRAZolam, morphine  injection Medications Prior to Admission:  Prior to Admission medications   Medication Sig Start Date End Date Taking? Authorizing Provider  ALPRAZolam (XANAX) 0.25 MG tablet Take 0.25 mg by mouth every 8 (eight) hours as needed for anxiety.    Yes Historical Provider, MD  alum & mag hydroxide-simeth (MINTOX) 200-200-20 MG/5ML suspension Take 30 mLs by mouth as needed for indigestion or heartburn. No more than 4 doses per 24 hours   Yes Historical Provider, MD  Cholecalciferol (VITAMIN D-3) 1000 UNITS CAPS Take 1,000 Units by mouth daily.    Yes Historical Provider, MD  cloNIDine (CATAPRES) 0.3 MG tablet Take 0.3 mg by mouth 2 (two) times daily.   Yes Historical Provider, MD  diphenhydrAMINE-zinc acetate (BENADRYL) cream Apply topically 3 (three) times daily as needed for itching. 08/26/16  Yes Barton Dubois, MD  guaifenesin (ROBITUSSIN) 100 MG/5ML syrup Take 200 mg by mouth every 6 (six) hours as needed for cough.   Yes Historical Provider, MD  hydrocortisone cream 1 % Apply topically 3 (three) times daily. 08/26/16  Yes Barton Dubois, MD  lamoTRIgine (LAMICTAL) 25 MG tablet Take 50 mg by mouth daily.   Yes Historical Provider, MD  loperamide (IMODIUM) 2 MG capsule Take 2 mg by mouth as needed for diarrhea or loose stools. No more than 8 doses in 24 hrs   Yes Historical Provider, MD  magnesium hydroxide (MILK OF MAGNESIA) 400 MG/5ML suspension Take 30 mLs by mouth at bedtime as needed for mild constipation.   Yes Historical Provider, MD  Memantine HCl-Donepezil HCl (NAMZARIC) 28-10 MG CP24 Take 1 tablet by mouth daily.    Yes Historical Provider, MD  metoprolol succinate (TOPROL-XL) 25 MG 24 hr tablet Take 1 tablet (25 mg total) by mouth at bedtime. 08/26/16  Yes Barton Dubois, MD  potassium chloride SA (K-DUR,KLOR-CON) 20 MEQ tablet Take 20 mEq by mouth 2 (two) times daily.   Yes Historical Provider, MD  sennosides-docusate sodium (SENOKOT-S) 8.6-50 MG tablet Take 1 tablet by mouth at bedtime.   Yes  Historical Provider, MD  sertraline (ZOLOFT) 100 MG tablet Take 100 mg by mouth daily.    Yes Historical Provider, MD  Ipratropium-Albuterol (COMBIVENT RESPIMAT) 20-100 MCG/ACT AERS respimat Inhale 1 puff into the lungs every 6 (six) hours as needed for wheezing or shortness of breath. 08/26/16   Barton Dubois, MD   Allergies  Allergen Reactions  . Levaquin [Levofloxacin In D5w]     Per MAR  . Penicillins     Per Mar  . Latex Rash    Causes blisters    Review of Systems  Unable to perform ROS: Mental status change    Physical Exam  Constitutional: She appears well-developed and well-nourished.  HENT:  Head: Normocephalic and atraumatic.  Neck: Normal range of motion.  Cardiovascular:  A-fib  Pulmonary/Chest:  Increased work of breathing  Musculoskeletal: Normal range of motion.  Neurological: She is alert.  Oriented to self only  Skin: Skin is warm.  Psychiatric:  Confused. Thought processes circumstnatial  Nursing note and vitals reviewed.   Vital Signs: BP 115/72 (BP Location: Left Arm)   Pulse (!) 55   Temp 98.5 F (36.9 C) (Oral)   Resp 16   Ht _0  (1.575 m)   Wt 82.2 kg (181 lb 4.8 oz)   SpO2 97%   BMI 33.16 kg/m  Pain Assessment: No/denies pain       SpO2: SpO2: 97 % O2 Device:SpO2: 97 % O2 Flow Rate: .O2 Flow Rate (L/min): 2 L/min  IO: Intake/output summary:  Intake/Output Summary (Last 24 hours) at 09/04/16 1217 Last data filed at 09/03/16 1819  Gross per 24 hour  Intake           144.13 ml  Output                0 ml  Net           144.13 ml    LBM: Last BM Date: 09/03/16 Baseline Weight: Weight: 77.1 kg (169 lb 15.6 oz) Most recent weight: Weight: 82.2 kg (181 lb 4.8 oz)     Palliative Assessment/Data:   Flowsheet Rows   Flowsheet Row Most Recent Value  Intake Tab  Referral Department  Hospitalist  Unit at Time of Referral  Med/Surg Unit  Palliative Care Primary  Diagnosis  Cardiac  Date Notified  09/03/16  Palliative Care Type   New Palliative care  Reason for referral  Pain, Non-pain Symptom, Clarify Goals of Care, Psychosocial or Spiritual support  Date of Admission  09/03/16  Date first seen by Palliative Care  09/04/16  # of days Palliative referral response time  1 Day(s)  # of days IP prior to Palliative referral  0  Clinical Assessment  Palliative Performance Scale Score  40%  Pain Max last 24 hours  Not able to report  Pain Min Last 24 hours  Not able to report  Dyspnea Max Last 24 Hours  Not able to report  Dyspnea Min Last 24 hours  Not able to report  Nausea Max Last 24 Hours  Not able to report  Nausea Min Last 24 Hours  Not able to report  Anxiety Max Last 24 Hours  Not able to report  Anxiety Min Last 24 Hours  Not able to report  Other Max Last 24 Hours  Not able to report  Psychosocial & Spiritual Assessment  Palliative Care Outcomes      Time In: 1230 Time Out: 1345 Time Total: 75 min Greater than 50%  of this time was spent counseling and coordinating care related to the above assessment and plan. Staffed with Dr. Alfredia Ferguson Signed by: Dory Horn, NP   Please contact Palliative Medicine Team phone at [phone] for questions and concerns.  For individual provider: See Shea Evans

## 2016-09-05 DIAGNOSIS — B372 Candidiasis of skin and nail: Secondary | ICD-10-CM

## 2016-09-05 LAB — URINE CULTURE

## 2016-09-05 LAB — GLUCOSE, CAPILLARY
GLUCOSE-CAPILLARY: 98 mg/dL (ref 65–99)
GLUCOSE-CAPILLARY: 99 mg/dL (ref 65–99)
Glucose-Capillary: 84 mg/dL (ref 65–99)
Glucose-Capillary: 102 mg/dL — ABNORMAL HIGH (ref 65–99)

## 2016-09-05 NOTE — Progress Notes (Signed)
Daily Progress Note   Patient Name: Kaylee Turner       Date: 09/05/2016 DOB: 1931-08-09  Age: 82 y.o. MRN#: [id number] Attending Physician: Kerney Elbe, DO Primary Care Physician: No primary care provider on file. Admit Date: 09/03/2016  Reason for Consultation/Follow-up: Establishing goals of care, Non pain symptom management, Pain control and Psychosocial/spiritual support  Subjective: Patient is alert today. No dyspnea at rest. No events overnight. Waiting for physical therapy to work with patient to ascertain what her functional level is like at this point. Currently no family at the bedside  Length of Stay: 2  Current Medications: Scheduled Meds:  . aspirin EC  81 mg Oral Daily  . cefTRIAXone (ROCEPHIN)  IV  1 g Intravenous Q24H  . cholecalciferol  1,000 Units Oral Daily  . cloNIDine  0.3 mg Oral BID  . insulin aspart  0-5 Units Subcutaneous QHS  . insulin aspart  0-9 Units Subcutaneous TID WC  . lamoTRIgine  50 mg Oral Daily  . metoprolol succinate  25 mg Oral QHS  . nitroGLYCERIN  0.5 inch Topical Q6H  . nystatin-triamcinolone   Topical BID  . senna-docusate  1 tablet Oral QHS  . simvastatin  20 mg Oral q1800    Continuous Infusions:    PRN Meds: acetaminophen, ALPRAZolam, morphine injection  Physical Exam  Constitutional: She appears well-developed.  Appears acutely ill  Pulmonary/Chest:  No work of breathing at rest  Musculoskeletal: Normal range of motion.  Neurological: She is alert.  To self  Skin: Skin is warm and dry.  Nursing note and vitals reviewed.           Vital Signs: BP (!) 144/71   Pulse 67   Temp 98.5 F (36.9 C) (Oral)   Resp 16   Ht 5\' 2"  (1.575 m)   Wt 78.9 kg (174 lb)   SpO2 100%   BMI 31.83 kg/m  SpO2: SpO2: 100  % O2 Device: O2 Device: Nasal Cannula O2 Flow Rate: O2 Flow Rate (L/min): 2 L/min  Intake/output summary:  Intake/Output Summary (Last 24 hours) at 09/05/16 1405 Last data filed at 09/05/16 1300  Gross per 24 hour  Intake              240 ml  Output  800 ml  Net             -560 ml   LBM: Last BM Date: 09/03/16 Baseline Weight: Weight: 77.1 kg (169 lb 15.6 oz) Most recent weight: Weight: 78.9 kg (174 lb)       Palliative Assessment/Data:    Flowsheet Rows   Flowsheet Row Most Recent Value  Intake Tab  Referral Department  Hospitalist  Unit at Time of Referral  Med/Surg Unit  Palliative Care Primary Diagnosis  Cardiac  Date Notified  09/03/16  Palliative Care Type  New Palliative care  Reason for referral  Pain, Non-pain Symptom, Clarify Goals of Care, Psychosocial or Spiritual support  Date of Admission  09/03/16  Date first seen by Palliative Care  09/04/16  # of days Palliative referral response time  1 Day(s)  # of days IP prior to Palliative referral  0  Clinical Assessment  Palliative Performance Scale Score  40%  Pain Max last 24 hours  Not able to report  Pain Min Last 24 hours  Not able to report  Dyspnea Max Last 24 Hours  Not able to report  Dyspnea Min Last 24 hours  Not able to report  Nausea Max Last 24 Hours  Not able to report  Nausea Min Last 24 Hours  Not able to report  Anxiety Max Last 24 Hours  Not able to report  Anxiety Min Last 24 Hours  Not able to report  Other Max Last 24 Hours  Not able to report  Psychosocial & Spiritual Assessment  Palliative Care Outcomes      Patient Active Problem List   Diagnosis Date Noted  . Palliative care encounter   . Acute cystitis without hematuria   . Non-STEMI (non-ST elevated myocardial infarction) ([place]) 09/03/2016  . Yeast dermatitis 09/03/2016  . Acute hyperkalemia 09/03/2016  . UTI (urinary tract infection) 09/03/2016  . Hypoxia 08/20/2016  . COPD (chronic obstructive pulmonary  disease) with emphysema ([place]) 08/20/2016  . Frequent falls 08/20/2016  . Leukocytosis 08/20/2016  . Prolonged Q-T interval on ECG 08/20/2016  . Pressure injury of skin 08/20/2016  . Diet-controlled diabetes mellitus ([place]) 12/31/2015  . Recurrent UTI 09/26/2015  . Depression with anxiety 09/26/2015  . Chronic diastolic heart failure ([place]) 08/04/2015  . Hypertensive heart and renal disease with congestive heart failure ([place]) 08/04/2015  . Bilateral edema of lower extremity 06/04/2015  . Depression due to dementia 06/04/2015  . Hypertensive heart disease with heart failure ([place]) 02/11/2015  . Generalized anxiety disorder 02/11/2015  . Malignant neoplasm of female breast ([place]) 02/11/2015  . Dementia without behavioral disturbance 11/12/2014  . Anxiety state 11/12/2014  . Essential hypertension 09/28/2014  . CKD (chronic kidney disease) stage 4, GFR 15-29 ml/min (HCC) 09/28/2014  . PVD (peripheral vascular disease) ([place]) 09/28/2014  . Hypertensive heart failure ([place]) 09/28/2014  . Primary generalized (osteo)arthritis 09/28/2014  . Dementia, senile 09/28/2014  . Neoplasm of breast 09/28/2014  . Age-related nuclear cataract of left eye 09/28/2014  . Hypokalemia 10/01/2011    Palliative Care Assessment & Plan   Patient Profile: Kaylee Shuba Copelandis a 82 y.o.femalewith a Past Medical History of HTN. dementia, hyper aldosteronism, FM, CKD, PVD, DM, COPD and cancerwho presents with L shoulder pain. Found to have an elevated trop with new EKG changes in the anterolateral lead and diagnosed with NSTEMI. Cardiology consulted and recommened Palliative Care. Patient also has UTI being treated with IV Ceftriaxone.    Recommendations/Plan:  Pain continue with morphine IV every  4 hours as needed. We'll add morphine concentrate for alternative that can be used in the facility  Dyspnea: She is comfortable at rest but we'll see how she does with physical exertion. We'll continue with morphine IV  as well as targeted pulmonary treatments such as oxygen nebulizer treatments, etc. and will add oral concentrate for alternative in the facility  Anxiety: Continue with Xanax 0.25 mg every 6 hours as needed  Goals of Care and Additional Recommendations:  Limitations on Scope of Treatment: Avoid Hospitalization, Minimize Medications, Initiate Comfort Feeding, No Chemotherapy, No Hemodialysis, No Surgical Procedures and No Tracheostomy  Code Status:    Code Status Orders        Start     Ordered   09/03/16 0852  Do not attempt resuscitation (DNR)  Continuous    Question Answer Comment  In the event of cardiac or respiratory ARREST Do not call a "code blue"   In the event of cardiac or respiratory ARREST Do not perform Intubation, CPR, defibrillation or ACLS   In the event of cardiac or respiratory ARREST Use medication by any route, position, wound care, and other measures to relive pain and suffering. May use oxygen, suction and manual treatment of airway obstruction as needed for comfort.      09/03/16 0851    Code Status History    Date Active Date Inactive Code Status Order ID Comments User Context   08/26/2016  5:59 PM 08/26/2016 11:58 PM DNR [id number]  Barton Dubois, MD Inpatient   08/20/2016  9:09 AM 08/26/2016  5:59 PM Full Code [id number]  Murlean Iba, MD ED   10/01/2011  3:45 AM 10/06/2011  5:33 PM Full Code [id number]  Rise Patience, MD Inpatient    Advance Directive Documentation   Flowsheet Row Most Recent Value  Type of Advance Directive  Out of facility DNR (pink MOST or yellow form)  Pre-existing out of facility DNR order (yellow form or pink MOST form)  No data  "MOST" Form in Place?  No data       Prognosis:   < 6 months the setting of advanced dementia, comorbidities of chronic kidney disease stage IV, diastolic heart failure, COPD, new non-STEMI (not on anticoagulation) not a cardiac cath candidate    Discharge Planning:  [place]  for rehab with Palliative care service follow-up  Thank you for allowing the Palliative Medicine Team to assist in the care of this patient.   Time In: 1045 Time Out: 1110 Total Time 25 min Prolonged Time Billed  no       Greater than 50%  of this time was spent counseling and coordinating care related to the above assessment and plan.  Dory Horn, NP  Please contact Palliative Medicine Team phone at [phone] for questions and concerns.

## 2016-09-05 NOTE — Progress Notes (Signed)
Physical Therapy Treatment Patient Details Name: Kaylee Turner MRN: [id number] DOB: Mar 27, 1931 Today's Date: 09/05/2016    History of Present Illness 82 yo female with onset of chest pain and NSTEMI, UTI and PMHx:  dementia, falls, pressure ulcers, CHF, DM, anxiety, HTN, PVD, BLE edema, cataracts, breast CA    PT Comments    Pt was seen for evaluation of her mobility and tolerance with regards to O2 sats and pulses.  Pt demonstrates a need for assistance and for O2 as she is unsafe and drops sats with no O2 on her.  Pt is taking off the O2 and is not aware of her personal issues, unaware of her actual place of residence outside the hospital.  Will follow acutely for strengthening and mobility with monitoring of sats during all movement.  Follow Up Recommendations  SNF     Equipment Recommendations  None recommended by PT    Recommendations for Other Services Rehab consult     Precautions / Restrictions Precautions Precautions: Fall (telemetry, cardiac precautions) Precaution Comments: monitor oxygen sats Restrictions Weight Bearing Restrictions: No    Mobility  Bed Mobility               General bed mobility comments: up when PT entered  Transfers Overall transfer level: Needs assistance Equipment used: 1 person hand held assist;Rolling walker (2 wheeled) Transfers: Sit to/from [facility] Sit to Stand: Mod assist Stand pivot transfers: Min assist       General transfer comment: cued all steps each trial  Ambulation/Gait Ambulation/Gait assistance: Min assist Ambulation Distance (Feet): 25 Feet Assistive device: Rolling walker (2 wheeled) Gait Pattern/deviations: Step-through pattern;Step-to pattern;Decreased stride length;Wide base of support;Trunk flexed;Drifts right/left Gait velocity: reduced Gait velocity interpretation: Below normal speed for age/gender General Gait Details: cued avoidance of obstacles, safety with transitions to  chair and needed cues to avoid her sitting before being at the chair   Stairs            Wheelchair Mobility    Modified Rankin (Stroke Patients Only)       Balance Overall balance assessment: History of Falls;Needs assistance Sitting-balance support: Feet supported Sitting balance-Leahy Scale: Fair   Postural control: Posterior lean Standing balance support: Bilateral upper extremity supported Standing balance-Leahy Scale: Poor                      Cognition Arousal/Alertness: Awake/alert Behavior During Therapy: WFL for tasks assessed/performed Overall Cognitive Status: History of cognitive impairments - at baseline Area of Impairment: Orientation;Attention;Memory;Following commands;Safety/judgement;Awareness;Problem solving Orientation Level: Time;Situation Current Attention Level: Selective Memory: Decreased recall of precautions;Decreased short-term memory Following Commands: Follows one step commands inconsistently Safety/Judgement: Decreased awareness of safety;Decreased awareness of deficits Awareness: Intellectual Problem Solving: Slow processing;Difficulty sequencing;Requires verbal cues      Exercises      General Comments        Pertinent Vitals/Pain Pain Assessment: No/denies pain    Home Living Family/patient expects to be discharged to:: Assisted living             Home Equipment: Wheelchair - manual Additional Comments: per sister    Prior Function Level of Independence: Needs assistance  Gait / Transfers Assistance Needed: Per sister in the room, patient was independent transfers to [facility]  and to BR, to meals , maybe ambulated pushing the WC ADL's / Homemaking Assistance Needed: help with all care of meals and housework     PT Goals (current goals can now be found  in the care plan section) Acute Rehab PT Goals Patient Stated Goal: to get home with her sister PT Goal Formulation: With patient Time For Goal Achievement:  09/19/16 Potential to Achieve Goals: Fair    Frequency    Min 2X/week      PT Plan      Co-evaluation             End of Session Equipment Utilized During Treatment: Gait belt Activity Tolerance: Patient tolerated treatment well;Treatment limited secondary to medical complications (Comment) (O2 sats dropped to 87% with no O2 as requested by nursing) Patient left: in chair;with call bell/phone within reach;with chair alarm set     Time: 1435-1511 PT Time Calculation (min) (ACUTE ONLY): 36 min  Charges:  $Gait Training: 8-22 mins                    G Codes:      Ramond Dial 09/24/2016, 3:40 PM    Mee Hives, PT MS Acute Rehab Dept. Number: [place]

## 2016-09-05 NOTE — Progress Notes (Signed)
PROGRESS NOTE    ANETHA KORST  [id number] DOB: 06-29-1931 DOA: 09/03/2016 PCP: No primary care provider on file.   Brief Narrative: Kalani Schwitters Copelandis a 82 y.o.femalewith a Past Medical History of HTN. dementia, hyper aldosteronism, FM, CKD, PVD, DM, COPD and cancerwho presents with L shoulder pain. Found to have an elevated trop with new EKG changes in the anterolateral lead and diagnosed with NSTEMI. Cardiology consulted and recommened Palliative Care. Patient also has UTI being treated with IV Ceftriaxone. Palliative Care Consulted and working to find out what her functional level is with Physical Therapy Assessment.   Assessment & Plan:   Principal Problem:   Non-STEMI (non-ST elevated myocardial infarction) ([place]) Active Problems:   Essential hypertension   CKD (chronic kidney disease) stage 4, GFR 15-29 ml/min (HCC)   Dementia without behavioral disturbance   Chronic diastolic heart failure (HCC)   Diet-controlled diabetes mellitus (HCC)   Hypoxia   COPD (chronic obstructive pulmonary disease) with emphysema (HCC)   Leukocytosis   Prolonged Q-T interval on ECG   Yeast dermatitis   Acute hyperkalemia   UTI (urinary tract infection)   Palliative care encounter   Acute cystitis without hematuria  Non-STEMI (non-ST elevated myocardial infarction)  -Patient presented with left shoulder and chest pain with associated elevated troponin that peaked at 1.31 and abnormal EKG concerning for ischemia -Findings previously discussed with Cardiologist who recommended medical therapies only given advanced age, dementia and history of frequent falls. Because of advanced chronic kidney disease stage IV she is not a candidate for cardiac catheterization. -HEART SCORE = 10 -Patient was on IV Heparin but D/C'd as Plan was more for Palliative Measures -Continue with ASA 81 mg, Metoprolol XL 25 mg, Morphine 2 mg IV q2hprn for Pain, NG 2% ointment, and Simvastatin 20 mg  -Heparin,  Cardiac Studies, Lipid profile and nonessential cardiac orders were discontinued by the cardiologist with only the most minimal of medical therapy for her NSTEMI recommended and remaining in place -Palliative Care Team Consulted and appreciate Recc's; Will not draw any more labs on patient.  -PT Recommended SNF placement. Consulted Education officer, museum for Available SNFs where Palliative can follow.   CKD (chronic kidney disease) stage 4, GFR 15-29 ml/min  -Not a candidate for cardiac catheterization -Avoid offending medications including ACE inhibitors -BUN/Cr was 27/1.69 Respectively. Will not Repeat BMP as goals are towards palliation.   UTI poA -Has Hx of Prior Klebsiella -C/w IV Ceftriaxone 1 gram -Continue to Monitor  Hypertension 2/2 to Hyperaldosteronism -Currently controlled -Continue to Monitor -On Clonidine 0.3 and Toprolol XL 25 mg  Chronic diastolic Heart Failure  -Currently compensated and not in Exacerbation.   Dementia  -Because of recent issues with prolonged QT and current cardiac issues Admitting team held her Zoloft and her dementia meds temporarily -C/w Lamotrigine for Mood  Diet-controlled diabetes mellitus -Follow CBGs and provide SSI Sensitive AC and HS -Hemoglobin A1c  Yeast Dermatitis -Mycolog cream every 12 hours  COPD -Currently not in Exacerbation  Anxiety -Continue with Xanax 0.25 mg po q6hprn  DVT prophylaxis: SCDs Code Status: DNR Family Communication: No family at bedside during Encounter. Disposition Plan: SNF Placement with Palliative Care Services.   Consultants:   Cardiology  Palliative Care  Procedures: None  Antimicrobials: IV Ceftriaxone  Subjective: Patient was seen and examined at bedside and she was laying in bed answering questions appropriately. Denied Current Cp.    Objective: Vitals:   09/05/16 0911 09/05/16 1204 09/05/16 1630 09/05/16 2028  BP: Marland Kitchen)  163/79 (!) 144/71 (!) 150/79   Pulse:  67 71   Resp:  16 17    Temp:  98.5 F (36.9 C) 98.9 F (37.2 C) 98.8 F (37.1 C)  TempSrc:  Oral Oral Oral  SpO2:  100% 100%   Weight:      Height:        Intake/Output Summary (Last 24 hours) at 09/05/16 2046 Last data filed at 09/05/16 1846  Gross per 24 hour  Intake              360 ml  Output              900 ml  Net             -540 ml   Filed Weights   09/03/16 0840 09/04/16 0541 09/05/16 0400  Weight: 82.1 kg (180 lb 14.4 oz) 82.2 kg (181 lb 4.8 oz) 78.9 kg (174 lb)    Examination: Physical Exam:  Constitutional: WN/WD obese, NAD and appears calm and comfortable Eyes:  Lids and conjunctivae normal, sclerae anicteric  ENMT: External Ears, Nose appear normal. Grossly normal hearing. Mucous membranes are moist.  Neck: Appears normal, supple, no cervical masses, normal ROM, no appreciable thyromegaly, no JVD Respiratory: Clear to auscultation bilaterally, no wheezing, rales, rhonchi or crackles. Mild dyspnea/tachypenic. No accessory muscle use.  Cardiovascular: Slightly bradycardic, no murmurs / rubs / gallops. S1 and S2 auscultated. No extremity edema. 2+ pedal pulses. No carotid bruits.  Abdomen: Soft, non-tender, non-distended. No masses palpated. No appreciable hepatosplenomegaly. Bowel sounds positive x4.  GU: Deferred. Musculoskeletal: No clubbing / cyanosis of digits/nails. No joint deformity upper and lower extremities. Nocontractures.  Skin: No rashes, lesions, ulcers on limited skin eval.  Neurologic: CN 2-12 grossly intact with no focal deficits. Sensation intact in all 4 Extremities, Strength 5/5 in all 4. Romberg sign cerebellar reflexes not assessed.  Psychiatric: Awake and alert. Normal judgement and insight.    Data Reviewed: I have personally reviewed following labs and imaging studies  CBC:  Recent Labs Lab 09/03/16 0410 09/04/16 0309  WBC 15.5* 13.8*  NEUTROABS 13.4* 9.8*  HGB 11.4* 9.3*  HCT 36.9 30.6*  MCV 94.4 93.9  PLT 260 [id number]   Basic Metabolic  Panel:  Recent Labs Lab 09/03/16 0410 09/03/16 1338 09/04/16 0309  NA 138 136 136  K 5.6* 5.6* 5.0  CL 108 107 106  CO2 22 23 24   GLUCOSE 207* 159* 116*  BUN 25* 27* 27*  CREATININE 1.87* 1.84* 1.69*  CALCIUM 9.2 8.8* 8.7*  MG  --  2.3 2.3  PHOS  --  3.2 2.9   GFR: Estimated Creatinine Clearance: 24.5 mL/min (by C-G formula based on SCr of 1.69 mg/dL (H)). Liver Function Tests:  Recent Labs Lab 09/04/16 0309  AST 21  ALT 26  ALKPHOS 72  BILITOT 0.6  PROT 5.7*  ALBUMIN 2.8*   No results for input(s): LIPASE, AMYLASE in the last 168 hours. No results for input(s): AMMONIA in the last 168 hours. Coagulation Profile: No results for input(s): INR, PROTIME in the last 168 hours. Cardiac Enzymes:  Recent Labs Lab 09/03/16 0410 09/03/16 0715 09/03/16 1338  TROPONINI 1.31* 1.19* 0.82*   BNP (last 3 results) No results for input(s): PROBNP in the last 8760 hours. HbA1C:  Recent Labs  09/03/16 0715  HGBA1C 5.9*   CBG:  Recent Labs Lab 09/04/16 1619 09/04/16 2141 09/05/16 0742 09/05/16 1117 09/05/16 1627  GLUCAP 112* 92 84 98 99  Lipid Profile: No results for input(s): CHOL, HDL, LDLCALC, TRIG, CHOLHDL, LDLDIRECT in the last 72 hours. Thyroid Function Tests: No results for input(s): TSH, T4TOTAL, FREET4, T3FREE, THYROIDAB in the last 72 hours. Anemia Panel: No results for input(s): VITAMINB12, FOLATE, FERRITIN, TIBC, IRON, RETICCTPCT in the last 72 hours. Sepsis Labs: No results for input(s): PROCALCITON, LATICACIDVEN in the last 168 hours.  Recent Results (from the past 240 hour(s))  Urine culture     Status: Abnormal   Collection Time: 09/03/16 10:48 AM  Result Value Ref Range Status   Specimen Description URINE, CATHETERIZED  Final   Special Requests NONE  Final   Culture >=100,000 COLONIES/mL ESCHERICHIA COLI (A)  Final   Report Status 09/05/2016 FINAL  Final   Organism ID, Bacteria ESCHERICHIA COLI (A)  Final      Susceptibility    Escherichia coli - MIC*    AMPICILLIN 8 SENSITIVE Sensitive     CEFAZOLIN <=4 SENSITIVE Sensitive     CEFTRIAXONE <=1 SENSITIVE Sensitive     CIPROFLOXACIN <=0.25 SENSITIVE Sensitive     GENTAMICIN <=1 SENSITIVE Sensitive     IMIPENEM <=0.25 SENSITIVE Sensitive     NITROFURANTOIN <=16 SENSITIVE Sensitive     TRIMETH/SULFA <=20 SENSITIVE Sensitive     AMPICILLIN/SULBACTAM 4 SENSITIVE Sensitive     PIP/TAZO <=4 SENSITIVE Sensitive     Extended ESBL NEGATIVE Sensitive     * >=100,000 COLONIES/mL ESCHERICHIA COLI    Radiology Studies: No results found. Scheduled Meds: . aspirin EC  81 mg Oral Daily  . cefTRIAXone (ROCEPHIN)  IV  1 g Intravenous Q24H  . cholecalciferol  1,000 Units Oral Daily  . cloNIDine  0.3 mg Oral BID  . insulin aspart  0-5 Units Subcutaneous QHS  . insulin aspart  0-9 Units Subcutaneous TID WC  . lamoTRIgine  50 mg Oral Daily  . metoprolol succinate  25 mg Oral QHS  . nitroGLYCERIN  0.5 inch Topical Q6H  . nystatin-triamcinolone   Topical BID  . senna-docusate  1 tablet Oral QHS  . simvastatin  20 mg Oral q1800   Continuous Infusions:    LOS: 2 days   Kerney Elbe, DO [facility] Pager [phone]  If 7PM-7AM, please contact night-coverage [email or web address] Password [facility] 09/05/2016, 8:46 PM

## 2016-09-06 DIAGNOSIS — N766 Ulceration of vulva: Secondary | ICD-10-CM

## 2016-09-06 LAB — GLUCOSE, CAPILLARY
GLUCOSE-CAPILLARY: 114 mg/dL — AB (ref 65–99)
GLUCOSE-CAPILLARY: 115 mg/dL — AB (ref 65–99)
GLUCOSE-CAPILLARY: 106 mg/dL — AB (ref 65–99)
GLUCOSE-CAPILLARY: 88 mg/dL (ref 65–99)

## 2016-09-06 NOTE — Progress Notes (Signed)
PROGRESS NOTE    Kaylee Turner  [id number] DOB: [birth year] DOA: 09/03/2016 PCP: No primary care provider on file.   Brief Narrative: Momina Nienaber Copelandis a 82 y.o.femalewith a Past Medical History of HTN. dementia, hyper aldosteronism, FM, CKD, PVD, DM, COPD and cancerwho presents with L shoulder pain. Found to have an elevated trop with new EKG changes in the anterolateral lead and diagnosed with NSTEMI. Cardiology consulted and recommened Palliative Care. Patient also has UTI being treated with IV Ceftriaxone. Palliative Care Consulted and PT recommending SNF placement. SNF Bed pending.   Assessment & Plan:   Principal Problem:   Non-STEMI (non-ST elevated myocardial infarction) ([place]) Active Problems:   Essential hypertension   CKD (chronic kidney disease) stage 4, GFR 15-29 ml/min (HCC)   Dementia without behavioral disturbance   Chronic diastolic heart failure (HCC)   Diet-controlled diabetes mellitus (HCC)   Hypoxia   COPD (chronic obstructive pulmonary disease) with emphysema (HCC)   Leukocytosis   Prolonged Q-T interval on ECG   Yeast dermatitis   Acute hyperkalemia   UTI (urinary tract infection)   Palliative care encounter   Acute cystitis without hematuria  Non-STEMI (non-ST elevated myocardial infarction)  -Patient presented with left shoulder and chest pain with associated elevated troponin that peaked at 1.31 and abnormal EKG concerning for ischemia -Findings previously discussed with Cardiologist who recommended medical therapies only given advanced age, dementia and history of frequent falls. Because of advanced chronic kidney disease stage IV she is not a candidate for cardiac catheterization. -HEART SCORE = 10 -Patient was on IV Heparin but D/C'd as Plan was more for Palliative Measures -Continue with ASA 81 mg, Metoprolol XL 25 mg, Morphine 2 mg IV q2hprn for Pain, NG 2% ointment, and Simvastatin 20 mg  -Heparin, Cardiac Studies, Lipid profile and  nonessential cardiac orders were discontinued by the cardiologist with only the most minimal of medical therapy for her NSTEMI recommended and remaining in place -Palliative Care Team Consulted and appreciate Recc's; Will not draw any more labs on patient.  -PT Recommended SNF placement. Consulted Education officer, museum for Available SNFs where Palliative can follow. D/C when Bed Available.  CKD (chronic kidney disease) stage 4, GFR 15-29 ml/min  -Not a candidate for cardiac catheterization -Avoid offending medications including ACE inhibitors -Will not Repeat BMP as goals are towards palliation.   UTI poA -Has Hx of Prior Klebsiella -C/w IV Ceftriaxone 1 gram -Continue to Monitor  Labial Ulcer -Likely from recurrent Urination on herself wearing Depends -Topical Abx and Cream.   Hypertension 2/2 to Hyperaldosteronism -Currently controlled -Continue to Monitor -On Clonidine 0.3 and Toprolol XL 25 mg  Chronic diastolic Heart Failure  -Currently compensated and not in Exacerbation.   Dementia  -Because of recent issues with prolonged QT and current cardiac issues Admitting team held her Zoloft and her dementia meds temporarily -C/w Lamotrigine for Mood  Diet-controlled diabetes mellitus -Follow CBGs and provide SSI Sensitive AC and HS -Hemoglobin A1c  Yeast Dermatitis -Mycolog cream every 12 hours  COPD -Currently not in Exacerbation  Anxiety -Continue with Xanax 0.25 mg po q6hprn  DVT prophylaxis: SCDs Code Status: DNR Family Communication: No family at bedside during Encounter. Disposition Plan: SNF Placement with Palliative Care Services once BED available.   Consultants:   Cardiology  Palliative Care  Procedures: None  Antimicrobials: IV Ceftriaxone  Subjective: Patient was seen and examined at bedside and she was laying in bed answering questions appropriately but nurse stated she was a little more confused  today. No CP or SOB. When examined she appeared  appropriate. Likely waxing and waning. Discussed plan of care with Sisters and waiting for Bed Placement.   Objective: Vitals:   09/06/16 0017 09/06/16 0400 09/06/16 0824 09/06/16 1313  BP:   (!) 163/79 (!) 145/64  Pulse:   88 65  Resp:      Temp: 98.4 F (36.9 C) 98.4 F (36.9 C) 98.9 F (37.2 C) 98.7 F (37.1 C)  TempSrc: Oral Oral Oral Oral  SpO2:   91% 99%  Weight:  78.1 kg (172 lb 1.6 oz)    Height:        Intake/Output Summary (Last 24 hours) at 09/06/16 1456 Last data filed at 09/06/16 0715  Gross per 24 hour  Intake              120 ml  Output              400 ml  Net             -280 ml   Filed Weights   09/04/16 0541 09/05/16 0400 09/06/16 0400  Weight: 82.2 kg (181 lb 4.8 oz) 78.9 kg (174 lb) 78.1 kg (172 lb 1.6 oz)    Examination: Physical Exam:  Constitutional: WN/WD obese, NAD and appears calm and comfortable Eyes:  Lids and conjunctivae normal, sclerae anicteric  ENMT: External Ears, Nose appear normal. Grossly normal hearing. Mucous membranes are moist.  Neck: Appears normal, supple, no cervical masses, normal ROM, no appreciable thyromegaly, no JVD Respiratory: Clear to auscultation bilaterally, no wheezing, rales, rhonchi or crackles. Mild dyspnea/tachypenic. No accessory muscle use.  Cardiovascular: Slightly bradycardic, no murmurs / rubs / gallops. S1 and S2 auscultated. No extremity edema. 2+ pedal pulses. No carotid bruits.  Abdomen: Soft, non-tender, non-distended. No masses palpated. No appreciable hepatosplenomegaly. Bowel sounds positive x4.  GU: Assitance with Nurse Ebony Hail revealed labial Ulcer draining. Likely from recurrent urination.  Musculoskeletal: No clubbing / cyanosis of digits/nails. No joint deformity upper and lower extremities. Nocontractures.  Skin: No rashes, lesions, ulcers on limited skin eval.  Neurologic: CN 2-12 grossly intact with no focal deficits. Sensation intact in all 4 Extremities. Romberg sign cerebellar reflexes not  assessed.  Psychiatric: Awake and alert. Normal judgement and insight.    Data Reviewed: I have personally reviewed following labs and imaging studies  CBC:  Recent Labs Lab 09/03/16 0410 09/04/16 0309  WBC 15.5* 13.8*  NEUTROABS 13.4* 9.8*  HGB 11.4* 9.3*  HCT 36.9 30.6*  MCV 94.4 93.9  PLT 260 [id number]   Basic Metabolic Panel:  Recent Labs Lab 09/03/16 0410 09/03/16 1338 09/04/16 0309  NA 138 136 136  K 5.6* 5.6* 5.0  CL 108 107 106  CO2 22 23 24   GLUCOSE 207* 159* 116*  BUN 25* 27* 27*  CREATININE 1.87* 1.84* 1.69*  CALCIUM 9.2 8.8* 8.7*  MG  --  2.3 2.3  PHOS  --  3.2 2.9   GFR: Estimated Creatinine Clearance: 24.4 mL/min (by C-G formula based on SCr of 1.69 mg/dL (H)). Liver Function Tests:  Recent Labs Lab 09/04/16 0309  AST 21  ALT 26  ALKPHOS 72  BILITOT 0.6  PROT 5.7*  ALBUMIN 2.8*   No results for input(s): LIPASE, AMYLASE in the last 168 hours. No results for input(s): AMMONIA in the last 168 hours. Coagulation Profile: No results for input(s): INR, PROTIME in the last 168 hours. Cardiac Enzymes:  Recent Labs Lab 09/03/16 0410 09/03/16 0715 09/03/16 1338  TROPONINI 1.31* 1.19* 0.82*   BNP (last 3 results) No results for input(s): PROBNP in the last 8760 hours. HbA1C: No results for input(s): HGBA1C in the last 72 hours. CBG:  Recent Labs Lab 09/05/16 1117 09/05/16 1627 09/05/16 2149 09/06/16 0727 09/06/16 1135  GLUCAP 98 99 102* 114* 115*   Lipid Profile: No results for input(s): CHOL, HDL, LDLCALC, TRIG, CHOLHDL, LDLDIRECT in the last 72 hours. Thyroid Function Tests: No results for input(s): TSH, T4TOTAL, FREET4, T3FREE, THYROIDAB in the last 72 hours. Anemia Panel: No results for input(s): VITAMINB12, FOLATE, FERRITIN, TIBC, IRON, RETICCTPCT in the last 72 hours. Sepsis Labs: No results for input(s): PROCALCITON, LATICACIDVEN in the last 168 hours.  Recent Results (from the past 240 hour(s))  Urine culture     Status:  Abnormal   Collection Time: 09/03/16 10:48 AM  Result Value Ref Range Status   Specimen Description URINE, CATHETERIZED  Final   Special Requests NONE  Final   Culture >=100,000 COLONIES/mL ESCHERICHIA COLI (A)  Final   Report Status 09/05/2016 FINAL  Final   Organism ID, Bacteria ESCHERICHIA COLI (A)  Final      Susceptibility   Escherichia coli - MIC*    AMPICILLIN 8 SENSITIVE Sensitive     CEFAZOLIN <=4 SENSITIVE Sensitive     CEFTRIAXONE <=1 SENSITIVE Sensitive     CIPROFLOXACIN <=0.25 SENSITIVE Sensitive     GENTAMICIN <=1 SENSITIVE Sensitive     IMIPENEM <=0.25 SENSITIVE Sensitive     NITROFURANTOIN <=16 SENSITIVE Sensitive     TRIMETH/SULFA <=20 SENSITIVE Sensitive     AMPICILLIN/SULBACTAM 4 SENSITIVE Sensitive     PIP/TAZO <=4 SENSITIVE Sensitive     Extended ESBL NEGATIVE Sensitive     * >=100,000 COLONIES/mL ESCHERICHIA COLI    Radiology Studies: No results found. Scheduled Meds: . aspirin EC  81 mg Oral Daily  . cefTRIAXone (ROCEPHIN)  IV  1 g Intravenous Q24H  . cholecalciferol  1,000 Units Oral Daily  . cloNIDine  0.3 mg Oral BID  . insulin aspart  0-5 Units Subcutaneous QHS  . insulin aspart  0-9 Units Subcutaneous TID WC  . lamoTRIgine  50 mg Oral Daily  . metoprolol succinate  25 mg Oral QHS  . nitroGLYCERIN  0.5 inch Topical Q6H  . nystatin-triamcinolone   Topical BID  . senna-docusate  1 tablet Oral QHS  . simvastatin  20 mg Oral q1800   Continuous Infusions:    LOS: 3 days   Kerney Elbe, DO [facility] Pager [phone]  If 7PM-7AM, please contact night-coverage [email or web address] Password [email or web address] 09/06/2016, 2:56 PM

## 2016-09-06 NOTE — Progress Notes (Signed)
OT Cancellation Note  Patient Details Name: Kaylee Turner MRN: [id number] DOB: [birth year]   Cancelled Treatment:    Reason Eval/Treat Not Completed: Other (comment). In to see pt for OT eval and family in room stating that pt was c/o a lot of right shoulder pain and RN gave her some morphine and now she is resting comfortably. Not the best time for therapy eval--will re-attempt tomorrow.  Almon Register [id number] 09/06/2016, 1:54 PM

## 2016-09-07 DIAGNOSIS — R0902 Hypoxemia: Secondary | ICD-10-CM

## 2016-09-07 LAB — BLOOD GAS, ARTERIAL
Acid-Base Excess: 0.2 mmol/L (ref 0.0–2.0)
Bicarbonate: 25.6 mmol/L (ref 20.0–28.0)
Drawn by: 43707
O2 Content: 1 L/min
O2 Saturation: 94.9 %
Patient temperature: 98
pCO2 arterial: 50.8 mmHg — ABNORMAL HIGH (ref 32.0–48.0)
pH, Arterial: 7.321 — ABNORMAL LOW (ref 7.350–7.450)
pO2, Arterial: 80.5 mmHg — ABNORMAL LOW (ref 83.0–108.0)

## 2016-09-07 LAB — GLUCOSE, CAPILLARY
GLUCOSE-CAPILLARY: 129 mg/dL — AB (ref 65–99)
Glucose-Capillary: 103 mg/dL — ABNORMAL HIGH (ref 65–99)
Glucose-Capillary: 85 mg/dL (ref 65–99)
Glucose-Capillary: 103 mg/dL — ABNORMAL HIGH (ref 65–99)
Glucose-Capillary: 107 mg/dL — ABNORMAL HIGH (ref 65–99)

## 2016-09-07 MED ORDER — NITROGLYCERIN 2 % TD OINT: 0.5000 [in_us] | TOPICAL_OINTMENT | Freq: Four times a day (QID) | TRANSDERMAL | 0 refills | Status: AC

## 2016-09-07 MED ORDER — METOPROLOL TARTRATE 5 MG/5ML IV SOLN
5.0000 mg | Freq: Once | INTRAVENOUS | Status: AC
  Administered 2016-09-07: 5 mg via INTRAVENOUS
  Filled 2016-09-07: qty 5

## 2016-09-07 MED ORDER — NYSTATIN-TRIAMCINOLONE 100000-0.1 UNIT/GM-% EX CREA: TOPICAL_CREAM | Freq: Two times a day (BID) | CUTANEOUS | 0 refills | Status: AC

## 2016-09-07 MED ORDER — SIMVASTATIN 20 MG PO TABS: 20.0000 mg | ORAL_TABLET | Freq: Every day | ORAL | 0 refills | Status: AC

## 2016-09-07 MED ORDER — MORPHINE SULFATE (PF) 2 MG/ML IV SOLN: 2.0000 mg | INTRAVENOUS | 0 refills | Status: AC | PRN

## 2016-09-07 MED ORDER — ALPRAZOLAM 0.25 MG PO TABS: 0.2500 mg | ORAL_TABLET | Freq: Three times a day (TID) | ORAL | 0 refills | Status: AC | PRN

## 2016-09-07 MED ORDER — ASPIRIN 81 MG PO TBEC: 81.0000 mg | DELAYED_RELEASE_TABLET | Freq: Every day | ORAL | 0 refills | Status: AC

## 2016-09-07 MED ORDER — LAMOTRIGINE 25 MG PO TABS: 50.0000 mg | ORAL_TABLET | Freq: Every day | ORAL | 0 refills | Status: AC

## 2016-09-07 NOTE — Progress Notes (Signed)
Pt awakened to reposition and administer scheduled meds.  She became very dyspneic when placed in semi fowler's position in bed.  HR 130s, A-fib with RVR.  Sats 91% on 2L/[place].  BP 168/110.  Pt very anxious with increased work of breathing, stating "God, help me!"  Lamar Blinks, NP and rapid response RN notified.  PRN Xanax and morphine given, along with scheduled meds.  EKG shows A-fib/RVR.  Nursing staff will remain at bedside until pt stabilizes.  Will continue to monitor.  Jodell Cipro

## 2016-09-07 NOTE — Significant Event (Signed)
Rapid Response Event Note  Overview: Time Called: 2358 Arrival Time: 0005 Event Type: Respiratory  Initial Focused Assessment: Called by RNs, patient had increased work of breathing, increased BP and HR (appeared to be in AFIB), baseline patient has dementia and patient was anxious and restless on arrival. Palliative care assisting to help patient transition to SNF with palliative. Patient admitted for NSTEMI and UTI. BP in the 170s-190s, MAP elevated, HR from 100-160s, patient placed on venti mask to help with breathing. Patient warm, + pulses, lung sounds + crackles, + 1 generalized edema.  Interventions: -- EKG --> AFIB RVR -- 2mg  Morphine given for discomfort, RN gave PO antihypertensives and Xanax prior to my arrival. -- NP/MD called, updated  Plan of Care (if not transferred): On followup patient received 5mg  Lopressor, back in SR, oxygen weaned down to nasal cannula from venti mask  Event Summary: Name of Physician Notified: Schorr NP and Olevia Bowens MD   Outcome: Stayed in room and stabalized  Event End Time: [place], [place]

## 2016-09-07 NOTE — Progress Notes (Signed)
CSW received call from RN re: [facility]el Corning not being able to admit pt because she lacked a Sports administrator number.  CSW called and provided pt's [place] number to [place], [place] Admission Coordinator.  CSW received call from Freda Munro who stated that they could not admit pt until the am because they could not get her meds tonight.  RN to inform pt/family (at bedside)/MD.  Unit CSW to f/u in the am.

## 2016-09-07 NOTE — Progress Notes (Signed)
OT Cancellation Note  Patient Details Name: AMALIE ASAY MRN: [id number] DOB: 12/22/1930   Cancelled Treatment:    Reason Eval/Treat Not Completed: Other (comment). Spoke with RN this morning and she reports pt is really out of it (sleepy) due to events of last night and meds that were given. Will try back at a later time as schedule allows to complete eval.  Almon Register [id number] 09/07/2016, 8:06 AM

## 2016-09-07 NOTE — Discharge Summary (Signed)
Physician Discharge Summary  [name] [id number] DOB: 09/17/1931 DOA: 09/03/2016  PCP: No primary care provider on file.  Admit date: 09/03/2016 Discharge date: 09/07/2016  Admitted From: ALF Disposition:  SNF with Palliative Care  Recommendations for Outpatient Follow-up:  1. Follow up with PCP in 1-2 weeks 2. Patient to have Palliative Care Follow at Facility 3. Please obtain BMP/CBC in one week  Home Health: No Equipment/Devices: O2 via [place]  Discharge Condition: Stable CODE STATUS: DNR Diet recommendation: Heart Healthy / Carb Modified   Brief/Interim Summary: Kaylee Zieman Copelandis a 82 y.o.femalewith a Past Medical History of HTN. dementia, hyper aldosteronism, FM, CKD, PVD, DM, COPD and cancerwho presents with L shoulder pain. Found to have an elevated trop with new EKG changes in the anterolateral lead and diagnosed with NSTEMI. Cardiology consulted and recommened Palliative Care. Patient also had UTI being treated with IV Ceftriaxone x 3 days. Palliative Care Consulted and PT recommending SNF placement. Patient had rapid response last night and went into A Fib with RVR but converted with IV Metoprolol. Today she is doing well and is able to be D/C'd to SNF with Palliative Care to Follow.   Discharge Diagnoses:  Principal Problem:   Non-STEMI (non-ST elevated myocardial infarction) ([place]) Active Problems:   Essential hypertension   CKD (chronic kidney disease) stage 4, GFR 15-29 ml/min (HCC)   Dementia without behavioral disturbance   Chronic diastolic heart failure (HCC)   Diet-controlled diabetes mellitus (HCC)   Hypoxia   COPD (chronic obstructive pulmonary disease) with emphysema (HCC)   Leukocytosis   Prolonged Q-T interval on ECG   Yeast dermatitis   Acute hyperkalemia   UTI (urinary tract infection)   Palliative care encounter   Acute cystitis without hematuria  Non-STEMI (non-ST elevated myocardial infarction)  -Patient presented with left  shoulder and chest pain with associated elevated troponin that peaked at 1.31 and abnormal EKG concerning for ischemia -Findings previously discussed with Cardiologist who recommended medical therapiesonly given advanced age, dementia and history of frequent falls. Because of advanced chronic kidney disease stage IV she is not a candidate for cardiac catheterization. -HEART SCORE = 10 -Patient was on IV Heparin but D/C'd as Plan was more for Palliative Measures -Continue with ASA 81 mg, Metoprolol XL 25 mg, Morphine 2 mg IV q2hprn for Pain, NG 2% ointment, and Simvastatin 20 mg  -Heparin, Cardiac Studies, Lipid profile and nonessential cardiac orders were discontinued by the cardiologist with only the most minimal of medical therapy for her NSTEMIrecommended and remaining in place -Palliative Care Team Consulted and appreciate Recc's; Will not draw any more labs on patient.  -PT Recommended SNF placement.  -Consulted Social Worker for SNF Placement where Palliative can follow  CKD (chronic kidney disease) stage 4, GFR 15-29 ml/min  -Not acandidate for cardiac catheterization -Avoid offending medications including ACE inhibitors -Will not Repeat BMP as goals are towards palliation.   UTI poA -Has Hx of Prior Klebsiella -Had IV Ceftriaxone 1 gram x3 -Continue to Monitor for Signs and Symtoms  Labial Ulcer -Likely from recurrent Urination on herself wearing Depends -Topical Abx and Cream.   Hypertension 2/2 to Hyperaldosteronism -Currently controlled -Continue to Monitor -On Clonidine 0.3 and Toprolol XL 25 mg  Chronic diastolic Heart Failure  -Currently compensated and not in Exacerbation.   Dementia  -Because of recent issues with prolonged QT and current cardiac issues Admitting team held her Zoloft and her dementia meds temporarily -C/w Lamotrigine for Mood -May Restart Zoloft and Dementia Meds  Diet-controlled diabetes mellitus --No Insulin Required at D/C.    Yeast Dermatitis -Mycolog cream every 12 hours  COPD -Patient will go on O2 via [place]. -Currently not in Exacerbation  Anxiety -Continue with Xanax 0.25 mg po q6hprn  Discharge Instructions  Discharge Instructions    Call MD for:  difficulty breathing, headache or visual disturbances    Complete by:  As directed    Call MD for:  persistant dizziness or light-headedness    Complete by:  As directed    Call MD for:  persistant nausea and vomiting    Complete by:  As directed    Diet - low sodium heart healthy    Complete by:  As directed    Discharge instructions    Complete by:  As directed    Follow up Care at SNF with Palliative Care.   For home use only DME oxygen    Complete by:  As directed    2 Liters O2 via [place] and [place] as needed.   Mode or (Route):  Nasal cannula   Frequency:  Continuous (stationary and portable oxygen unit needed)   Oxygen conserving device:  Yes   Oxygen delivery system:  Gas       Medication List    TAKE these medications   ALPRAZolam 0.25 MG tablet Commonly known as:  XANAX Take 1 tablet (0.25 mg total) by mouth every 8 (eight) hours as needed for anxiety.   aspirin 81 MG EC tablet Take 1 tablet (81 mg total) by mouth daily. Start taking on:  09/08/2016   cloNIDine 0.3 MG tablet Commonly known as:  CATAPRES Take 0.3 mg by mouth 2 (two) times daily.   diphenhydrAMINE-zinc acetate cream Commonly known as:  BENADRYL Apply topically 3 (three) times daily as needed for itching.   guaifenesin 100 MG/5ML syrup Commonly known as:  ROBITUSSIN Take 200 mg by mouth every 6 (six) hours as needed for cough.   hydrocortisone cream 1 % Apply topically 3 (three) times daily.   Ipratropium-Albuterol 20-100 MCG/ACT Aers respimat Commonly known as:  COMBIVENT RESPIMAT Inhale 1 puff into the lungs every 6 (six) hours as needed for wheezing or shortness of breath.   lamoTRIgine 25 MG tablet Commonly known as:  LAMICTAL Take 2 tablets (50  mg total) by mouth daily. Start taking on:  09/08/2016   loperamide 2 MG capsule Commonly known as:  IMODIUM Take 2 mg by mouth as needed for diarrhea or loose stools. No more than 8 doses in 24 hrs   magnesium hydroxide 400 MG/5ML suspension Commonly known as:  MILK OF MAGNESIA Take 30 mLs by mouth at bedtime as needed for mild constipation.   metoprolol succinate 25 MG 24 hr tablet Commonly known as:  TOPROL-XL Take 1 tablet (25 mg total) by mouth at bedtime.   MINTOX 200-200-20 MG/5ML suspension Generic drug:  alum & mag hydroxide-simeth Take 30 mLs by mouth as needed for indigestion or heartburn. No more than 4 doses per 24 hours   morphine 2 MG/ML injection Inject 1 mL (2 mg total) into the vein every 4 (four) hours as needed (chest pain, hold for SBP<110 or MAP <65).   NAMZARIC 28-10 MG Cp24 Generic drug:  Memantine HCl-Donepezil HCl Take 1 tablet by mouth daily.   nitroGLYCERIN 2 % ointment Commonly known as:  NITROGLYN Apply 0.5 inches topically every 6 (six) hours.   nystatin-triamcinolone cream Commonly known as:  MYCOLOG II Apply topically 2 (two) times daily.   potassium  chloride SA 20 MEQ tablet Commonly known as:  K-DUR,KLOR-CON Take 20 mEq by mouth 2 (two) times daily.   sennosides-docusate sodium 8.6-50 MG tablet Commonly known as:  SENOKOT-S Take 1 tablet by mouth at bedtime.   sertraline 100 MG tablet Commonly known as:  ZOLOFT Take 100 mg by mouth daily.   simvastatin 20 MG tablet Commonly known as:  ZOCOR Take 1 tablet (20 mg total) by mouth daily at 6 PM.   Vitamin D-3 1000 units Caps Take 1,000 Units by mouth daily.       Allergies  Allergen Reactions  . Levaquin [Levofloxacin In D5w]     Per MAR  . Penicillins     Per Mar  . Latex Rash    Causes blisters     Consultations:  Cardiology  Palliative Care  Procedures/Studies: Dg Chest 2 View  Result Date: 08/25/2016 CLINICAL DATA:  Fever EXAM: CHEST  2 VIEW COMPARISON:   08/20/2016 FINDINGS: There is cardiomegaly. Bibasilar atelectasis. Trace bilateral effusions. No overt edema. No acute bony abnormality. IMPRESSION: Cardiomegaly with bibasilar atelectasis and trace effusions. Electronically Signed   By: Rolm Baptise M.D.   On: 08/25/2016 12:16   Dg Chest 2 View  Result Date: 08/20/2016 CLINICAL DATA:  82 year old female with hypoxia and shortness of breath. EXAM: CHEST  2 VIEW COMPARISON:  Chest radiograph dated 10/01/2011 FINDINGS: Two views of the chest demonstrates emphysematous changes of the lungs. There bibasilar linear atelectasis/ scarring. Infiltrate is less likely but not excluded. Clinical correlation is recommended. No pleural effusion or pneumothorax. There is stable mild cardiomegaly. There is osteopenia with degenerative changes of the spine. Right shoulder hemiarthroplasty. No acute fracture. IMPRESSION: Bibasilar linear atelectasis/ scarring versus less likely infiltrate. Clinical correlation is recommended. Electronically Signed   By: Anner Crete M.D.   On: 08/20/2016 02:15   Ct Head Wo Contrast  Result Date: 08/27/2016 CLINICAL DATA:  Unwitnessed fall while coming out of bathroom. Possible head injury. History of dementia, hypertension and breast cancer. EXAM: CT HEAD WITHOUT CONTRAST TECHNIQUE: Contiguous axial images were obtained from the base of the skull through the vertex without intravenous contrast. COMPARISON:  CT HEAD Apr 02, 2016 FINDINGS: BRAIN: The ventricles and sulci are normal for age, cavum septum pellucidum. No intraparenchymal hemorrhage, mass effect nor midline shift. Patchy to confluent supratentorial white matter hypodensities. No acute large vascular territory infarcts. No abnormal extra-axial fluid collections. Basal cisterns are patent. VASCULAR: Moderate calcific atherosclerosis of the carotid siphons. SKULL: No skull fracture. Residual small RIGHT frontal scalp hematoma. SINUSES/ORBITS: Persistent RIGHT sphenoid sinus  opacification. Mastoid air cells are well aerated. Debris within the external auditory canals most compatible with cerumen. The included ocular globes and orbital contents are non-suspicious. OTHER: None. IMPRESSION: No acute intracranial process. Small resolving RIGHT frontal scalp hematoma.  No skull fracture. Involutional changes. Moderate to severe chronic small vessel ischemic disease. Electronically Signed   By: Elon Alas M.D.   On: 08/27/2016 05:06   Ct Angio Chest Pe W Or Wo Contrast  Result Date: 08/20/2016 CLINICAL DATA:  82 year old female with hypoxia and elevated D-dimer. EXAM: CT ANGIOGRAPHY CHEST WITH CONTRAST TECHNIQUE: Multidetector CT imaging of the chest was performed using the standard protocol during bolus administration of intravenous contrast. Multiplanar CT image reconstructions and MIPs were obtained to evaluate the vascular anatomy. CONTRAST:  80 cc Isovue 370 COMPARISON:  Chest radiograph dated 08/20/2016 FINDINGS: There is emphysematous changes of the lungs. Right lung base linear atelectasis/scarring. No focal consolidation, pleural effusion,  or pneumothorax. The central airways are patent. Advanced atherosclerotic calcification of the thoracic aorta. No aneurysmal dilatation or evidence of dissection. Evaluation of the pulmonary arteries is somewhat limited due to respiratory motion artifact and suboptimal opacification and visualization of the distal branches. No central pulmonary artery embolus identified. There is no cardiomegaly or pericardial effusion. Advanced coronary vascular calcification. No hilar or mediastinal adenopathy identified. Esophagus is grossly unremarkable. Bilateral calcified thyroid nodules noted. There is no axillary adenopathy. Left breast implant. There is osteopenia with degenerative changes of the spine. No acute fracture. The partially visualized 2 cm hypodense lesion in the region of the left kidney, incompletely characterized but possibly a  renal cyst. Further evaluation with nonemergent renal ultrasound recommended. There is an 8 mm left adrenal adenoma. The visualized upper abdomen is otherwise unremarkable. IMPRESSION: No acute intrathoracic pathology. No CT evidence of central pulmonary artery embolus. Advanced atherosclerotic calcification of the thoracic aorta. No dissection. Emphysema.  No focal consolidation. Small left adrenal adenoma and a partially visualized probable left renal upper pole cyst. Further evaluation with nonemergent renal ultrasound recommended. Electronically Signed   By: Anner Crete M.D.   On: 08/20/2016 06:40   Dg Chest Port 1 View  Result Date: 09/03/2016 CLINICAL DATA:  Chest pain and shortness of breath tonight. EXAM: PORTABLE CHEST 1 VIEW COMPARISON:  Radiographs 08/25/2016.  CT 08/20/2016 FINDINGS: Stable cardiomegaly and tortuous atherosclerotic thoracic aorta. There are increased patchy bibasilar opacities. No pulmonary edema. Soft tissue attenuation partially obscures evaluation of the right costophrenic angle, allowing for this, no evidence of pleural effusion. Right shoulder prosthesis. IMPRESSION: Increased patchy bibasilar opacities, may be atelectasis, recommend correlation to exclude aspiration. Stable cardiomegaly and tortuous atherosclerotic thoracic aorta. Electronically Signed   By: Jeb Levering M.D.   On: 09/03/2016 04:24    ECHOCARDIOGRAM - Discontinued as Palliative via Cardiology.  Subjective: Patient doing well today eating food. Had Rapid Response yesterday because of A. Fib with RVR now in NSR. No other complaints or concerns at this time.   Discharge Exam: Vitals:   09/07/16 0732 09/07/16 1220  BP: 132/70 (!) 178/89  Pulse: 69 62  Resp: 12 16  Temp: 98.4 F (36.9 C) 98.3 F (36.8 C)   Vitals:   09/07/16 0536 09/07/16 0633 09/07/16 0732 09/07/16 1220  BP: 112/74 (!) 103/59 132/70 (!) 178/89  Pulse:   69 62  Resp: 10 14 12 16   Temp:   98.4 F (36.9 C) 98.3 F  (36.8 C)  TempSrc:   Oral Oral  SpO2: 97% 96% 95% 92%  Weight:      Height:       General: Pt is alert, awake, not in acute distress Cardiovascular: RRR, S1/S2 +, no rubs, no gallops Respiratory: CTA bilaterally, no wheezing, no rhonchi Abdominal: Soft, NT, ND, bowel sounds + Extremities: no edema, no cyanosis  The results of significant diagnostics from this hospitalization (including imaging, microbiology, ancillary and laboratory) are listed below for reference.    Microbiology: Recent Results (from the past 240 hour(s))  Urine culture     Status: Abnormal   Collection Time: 09/03/16 10:48 AM  Result Value Ref Range Status   Specimen Description URINE, CATHETERIZED  Final   Special Requests NONE  Final   Culture >=100,000 COLONIES/mL ESCHERICHIA COLI (A)  Final   Report Status 09/05/2016 FINAL  Final   Organism ID, Bacteria ESCHERICHIA COLI (A)  Final      Susceptibility   Escherichia coli - MIC*    AMPICILLIN  8 SENSITIVE Sensitive     CEFAZOLIN <=4 SENSITIVE Sensitive     CEFTRIAXONE <=1 SENSITIVE Sensitive     CIPROFLOXACIN <=0.25 SENSITIVE Sensitive     GENTAMICIN <=1 SENSITIVE Sensitive     IMIPENEM <=0.25 SENSITIVE Sensitive     NITROFURANTOIN <=16 SENSITIVE Sensitive     TRIMETH/SULFA <=20 SENSITIVE Sensitive     AMPICILLIN/SULBACTAM 4 SENSITIVE Sensitive     PIP/TAZO <=4 SENSITIVE Sensitive     Extended ESBL NEGATIVE Sensitive     * >=100,000 COLONIES/mL ESCHERICHIA COLI    Labs: BNP (last 3 results) No results for input(s): BNP in the last 8760 hours. Basic Metabolic Panel:  Recent Labs Lab 09/03/16 0410 09/03/16 1338 09/04/16 0309  NA 138 136 136  K 5.6* 5.6* 5.0  CL 108 107 106  CO2 22 23 24   GLUCOSE 207* 159* 116*  BUN 25* 27* 27*  CREATININE 1.87* 1.84* 1.69*  CALCIUM 9.2 8.8* 8.7*  MG  --  2.3 2.3  PHOS  --  3.2 2.9   Liver Function Tests:  Recent Labs Lab 09/04/16 0309  AST 21  ALT 26  ALKPHOS 72  BILITOT 0.6  PROT 5.7*  ALBUMIN  2.8*   No results for input(s): LIPASE, AMYLASE in the last 168 hours. No results for input(s): AMMONIA in the last 168 hours. CBC:  Recent Labs Lab 09/03/16 0410 09/04/16 0309  WBC 15.5* 13.8*  NEUTROABS 13.4* 9.8*  HGB 11.4* 9.3*  HCT 36.9 30.6*  MCV 94.4 93.9  PLT 260 236   Cardiac Enzymes:  Recent Labs Lab 09/03/16 0410 09/03/16 0715 09/03/16 1338  TROPONINI 1.31* 1.19* 0.82*   BNP: Invalid input(s): POCBNP CBG:  Recent Labs Lab 09/06/16 1636 09/06/16 2157 09/07/16 0422 09/07/16 0800 09/07/16 1215  GLUCAP 106* 88 103* 85 103*   D-Dimer No results for input(s): DDIMER in the last 72 hours. Hgb A1c No results for input(s): HGBA1C in the last 72 hours. Lipid Profile No results for input(s): CHOL, HDL, LDLCALC, TRIG, CHOLHDL, LDLDIRECT in the last 72 hours. Thyroid function studies No results for input(s): TSH, T4TOTAL, T3FREE, THYROIDAB in the last 72 hours.  Invalid input(s): FREET3 Anemia work up No results for input(s): VITAMINB12, FOLATE, FERRITIN, TIBC, IRON, RETICCTPCT in the last 72 hours. Urinalysis    Component Value Date/Time   COLORURINE AMBER (A) 09/03/2016 1048   APPEARANCEUR CLOUDY (A) 09/03/2016 1048   LABSPEC 1.021 09/03/2016 1048   PHURINE 5.5 09/03/2016 1048   GLUCOSEU NEGATIVE 09/03/2016 1048   HGBUR SMALL (A) 09/03/2016 1048   BILIRUBINUR SMALL (A) 09/03/2016 1048   KETONESUR NEGATIVE 09/03/2016 1048   PROTEINUR 30 (A) 09/03/2016 1048   UROBILINOGEN 0.2 09/30/2011 2349   NITRITE POSITIVE (A) 09/03/2016 1048   LEUKOCYTESUR LARGE (A) 09/03/2016 1048   Sepsis Labs Invalid input(s): PROCALCITONIN,  WBC,  LACTICIDVEN Microbiology Recent Results (from the past 240 hour(s))  Urine culture     Status: Abnormal   Collection Time: 09/03/16 10:48 AM  Result Value Ref Range Status   Specimen Description URINE, CATHETERIZED  Final   Special Requests NONE  Final   Culture >=100,000 COLONIES/mL ESCHERICHIA COLI (A)  Final   Report  Status 09/05/2016 FINAL  Final   Organism ID, Bacteria ESCHERICHIA COLI (A)  Final      Susceptibility   Escherichia coli - MIC*    AMPICILLIN 8 SENSITIVE Sensitive     CEFAZOLIN <=4 SENSITIVE Sensitive     CEFTRIAXONE <=1 SENSITIVE Sensitive  CIPROFLOXACIN <=0.25 SENSITIVE Sensitive     GENTAMICIN <=1 SENSITIVE Sensitive     IMIPENEM <=0.25 SENSITIVE Sensitive     NITROFURANTOIN <=16 SENSITIVE Sensitive     TRIMETH/SULFA <=20 SENSITIVE Sensitive     AMPICILLIN/SULBACTAM 4 SENSITIVE Sensitive     PIP/TAZO <=4 SENSITIVE Sensitive     Extended ESBL NEGATIVE Sensitive     * >=100,000 COLONIES/mL ESCHERICHIA COLI   Time coordinating discharge: Over 30 minutes  SIGNED:  Kerney Elbe, DO [facility] 09/07/2016, 3:45 PM Pager [phone]  If 7PM-7AM, please contact night-coverage [email or web address] Password [email or web address]

## 2016-09-07 NOTE — Clinical Social Work Placement (Signed)
   CLINICAL SOCIAL WORK PLACEMENT  NOTE  Date:  09/07/2016  Patient Details  Name: Kaylee Turner MRN: [id number] Date of Birth: 12/06/1930  Clinical Social Work is seeking post-discharge placement for this patient at the [place] level of care (*CSW will initial, date and re-position this form in  chart as items are completed):  Yes   Patient/family provided with [facility] Work Department's list of facilities offering this level of care within the geographic area requested by the patient (or if unable, by the patient's family).  Yes   Patient/family informed of their freedom to choose among providers that offer the needed level of care, that participate in Medicare, Medicaid or managed care program needed by the patient, have an available bed and are willing to accept the patient.  Yes   Patient/family informed of [facility]'s ownership interest in [facility] and [facility], as well as of the fact that they are under no obligation to receive care at these facilities.  PASRR submitted to EDS on 09/07/16     PASRR number received on       Existing PASRR number confirmed on 09/07/16     FL2 transmitted to all facilities in geographic area requested by pt/family on 09/07/16     FL2 transmitted to all facilities within larger geographic area on       Patient informed that his/her managed care company has contracts with or will negotiate with certain facilities, including the following:        Yes   Patient/family informed of bed offers received.  Patient chooses bed at [facility]     Physician recommends and patient chooses bed at      Patient to be transferred to [facility] on 09/07/16.  Patient to be transferred to facility by Ambulance     Patient family notified on 09/07/16 of transfer.  Name of family member notified:  Hill,Cathy     PHYSICIAN Please prepare priority discharge summary, including medications, Please  prepare prescriptions, Please sign FL2, Please sign DNR     Additional Comment:  Per MD patient is ready to discharge to [facility]. RN, patient, patient's family, and facility notified of discharge. RN given phone number for report and transport packet is on patient's chart. Ambulance transport requested. CSW signing off.   _______________________________________________ Samule Dry, LCSW 09/07/2016, 5:10 PM

## 2016-09-07 NOTE — Care Management Important Message (Signed)
Important Message  Patient Details  Name: Kaylee Turner MRN: [id number] Date of Birth: Nov 12, 1931   Medicare Important Message Given:  Yes    Nathen May 09/07/2016, 1:47 PM

## 2016-09-07 NOTE — Progress Notes (Signed)
Pt converted to NSR with HR in the 80s at 0150 this am.  Lamar Blinks, NP notified.  Will continue to monitor.  Jodell Cipro

## 2016-09-07 NOTE — NC FL2 (Signed)
[name]     IDENTIFICATION  Patient Name: Kaylee Turner Birthdate: 04/28/1931 Sex: female Admission Date (Current Location): 09/03/2016  [facility] and [place] Number:      Facility and Address:  [facility] [place], [place] [address] [place], [name]      Provider Number:    Attending Physician Name and Address:  Kaylee Elbe, DO  Relative Name and Phone Number:       Current Level of Care: Hospital Recommended Level of Care: [name] Prior Approval Number:    Date Approved/Denied:   PASRR Number:    Discharge Plan: Other (Comment) (Return to [name])    Current Diagnoses: Patient Active Problem List   Diagnosis Date Noted  . Palliative care encounter   . Acute cystitis without hematuria   . Non-STEMI (non-ST elevated myocardial infarction) ([name]) 09/03/2016  . Yeast dermatitis 09/03/2016  . Acute hyperkalemia 09/03/2016  . UTI (urinary tract infection) 09/03/2016  . Hypoxia 08/20/2016  . COPD (chronic obstructive pulmonary disease) with emphysema ([place]) 08/20/2016  . Frequent falls 08/20/2016  . Leukocytosis 08/20/2016  . Prolonged Q-T interval on ECG 08/20/2016  . Pressure injury of skin 08/20/2016  . Diet-controlled diabetes mellitus ([place]) 12/31/2015  . Recurrent UTI 09/26/2015  . Depression with anxiety 09/26/2015  . Chronic diastolic heart failure ([place]) 08/04/2015  . Hypertensive heart and renal disease with congestive heart failure ([place]) 08/04/2015  . Bilateral edema of lower extremity 06/04/2015  . Depression due to dementia 06/04/2015  . Hypertensive heart disease with heart failure ([place]) 02/11/2015  . Generalized anxiety disorder 02/11/2015  . Malignant neoplasm of female breast ([place]) 02/11/2015  . Dementia without behavioral disturbance 11/12/2014  . Anxiety state 11/12/2014  . Essential hypertension 09/28/2014  . CKD (chronic kidney disease)  stage 4, GFR 15-29 ml/min (HCC) 09/28/2014  . PVD (peripheral vascular disease) ([place]) 09/28/2014  . Hypertensive heart failure ([place]) 09/28/2014  . Primary generalized (osteo)arthritis 09/28/2014  . Dementia, senile 09/28/2014  . Neoplasm of breast 09/28/2014  . Age-related nuclear cataract of left eye 09/28/2014  . Hypokalemia 10/01/2011    Orientation RESPIRATION BLADDER Height & Weight     Self  O2 (2L) Incontinent Weight: 176 lb 1.6 oz (79.9 kg) Height:  5\' 2"  (157.5 cm)  BEHAVIORAL SYMPTOMS/MOOD NEUROLOGICAL BOWEL NUTRITION STATUS  [place]   Incontinent Diet (Heart Healthy/Carb Modified)  AMBULATORY STATUS COMMUNICATION OF NEEDS Skin   Limited Assist Verbally PU Stage and Appropriate Care PU Stage 1 Dressing: No Dressing                     Personal Care Assistance Level of Assistance  Bathing, Feeding, Dressing Bathing Assistance: Limited assistance Feeding assistance: Limited assistance Dressing Assistance: Limited assistance     Functional Limitations Info  Sight, Hearing, Speech Sight Info: Adequate Hearing Info: Adequate Speech Info: Adequate    SPECIAL CARE FACTORS FREQUENCY                       Contractures Contractures Info: Not present    Additional Factors Info  Code Status, Allergies, Insulin Sliding Scale Code Status Info: DNR Allergies Info: Levaquin Levofloxacin In D5w, Penicillins, Latex   Insulin Sliding Scale Info: Novolog - 3times daily with meals and bedtime       Current Medications (09/07/2016):  This is the current hospital active medication list Current Facility-Administered Medications  Medication Dose Route  Frequency Provider Last Rate Last Dose  . acetaminophen (TYLENOL) tablet 650 mg  650 mg Oral Q4H PRN Kaylee Parr, NP      . ALPRAZolam Kaylee Turner) tablet 0.25 mg  0.25 mg Oral Q8H PRN Kaylee Parr, NP   0.25 mg at 09/06/16 2354  . aspirin EC tablet 81 mg  81 mg Oral Daily Kaylee Casino, MD   81 mg at 09/06/16  0919  . cefTRIAXone (ROCEPHIN) 1 g in dextrose 5 % 50 mL IVPB  1 g Intravenous Q24H Kaylee Parr, NP   1 g at 09/06/16 1304  . cholecalciferol (VITAMIN D) tablet 1,000 Units  1,000 Units Oral Daily Kaylee Parr, NP   1,000 Units at 09/06/16 0919  . cloNIDine (CATAPRES) tablet 0.3 mg  0.3 mg Oral BID Kaylee Parr, NP   0.3 mg at 09/06/16 2344  . insulin aspart (novoLOG) injection 0-5 Units  0-5 Units Subcutaneous QHS Kaylee Parr, NP      . insulin aspart (novoLOG) injection 0-9 Units  0-9 Units Subcutaneous TID WC Kaylee Parr, NP   2 Units at 09/03/16 1321  . lamoTRIgine (LAMICTAL) tablet 50 mg  50 mg Oral Daily Kaylee Parr, NP   50 mg at 09/06/16 0919  . metoprolol succinate (TOPROL-XL) 24 hr tablet 25 mg  25 mg Oral QHS Kaylee Parr, NP   25 mg at 09/06/16 2343  . morphine 2 MG/ML injection 2 mg  2 mg Intravenous Q2H PRN Kaylee Mocha, MD   2 mg at 09/07/16 0015  . nitroGLYCERIN (NITROGLYN) 2 % ointment 0.5 inch  0.5 inch Topical Q6H Kaylee Parr, NP   0.5 inch at 09/07/16 0001  . nystatin-triamcinolone (MYCOLOG II) cream   Topical BID Kaylee Parr, NP      . senna-docusate (Senokot-S) tablet 1 tablet  1 tablet Oral QHS Kaylee Parr, NP   1 tablet at 09/06/16 2343  . simvastatin (ZOCOR) tablet 20 mg  20 mg Oral q1800 Kaylee Parr, NP   20 mg at 09/04/16 1800     Discharge Medications: Please see discharge summary for a list of discharge medications.  Relevant Imaging Results:  Relevant Lab Results:   Additional Information SSN [id number]   Kaylee Turner, [place]

## 2016-09-07 NOTE — Progress Notes (Signed)
Pt remains A-fib/RVR with HR 130s.  Other VSS.  Lamar Blinks, NP notified.  IV lopressor 5 mg given per order.  Will continue to monitor.  Jodell Cipro

## 2016-09-07 NOTE — Progress Notes (Signed)
RN received call from SW. Pt not able to go to SNF. MD notified. MD ordered to place patient back on Telemetry. MD aware of No IV access. Pt resting. Night shift aware

## 2016-09-07 NOTE — NC FL2 (Signed)
[place] LEVEL OF CARE SCREENING TOOL     IDENTIFICATION  Patient Name: Kaylee Turner Birthdate: October 07, 1931 Sex: female Admission Date (Current Location): 09/03/2016  [facility] and [place] Number:      Facility and Address:  [facility] [place], [place] [address] [place], [place] [address]      Provider Number:    Attending Physician Name and Address:  Kerney Elbe, DO  Relative Name and Phone Number:       Current Level of Care: Hospital Recommended Level of Care: Other (Comment) (SCU) Prior Approval Number:    Date Approved/Denied:   PASRR Number:    Discharge Plan: SNF   Current Diagnoses: Patient Active Problem List   Diagnosis Date Noted  . Palliative care encounter   . Acute cystitis without hematuria   . Non-STEMI (non-ST elevated myocardial infarction) ([place]) 09/03/2016  . Yeast dermatitis 09/03/2016  . Acute hyperkalemia 09/03/2016  . UTI (urinary tract infection) 09/03/2016  . Hypoxia 08/20/2016  . COPD (chronic obstructive pulmonary disease) with emphysema ([place]) 08/20/2016  . Frequent falls 08/20/2016  . Leukocytosis 08/20/2016  . Prolonged Q-T interval on ECG 08/20/2016  . Pressure injury of skin 08/20/2016  . Diet-controlled diabetes mellitus ([place]) 12/31/2015  . Recurrent UTI 09/26/2015  . Depression with anxiety 09/26/2015  . Chronic diastolic heart failure ([place]) 08/04/2015  . 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 Bilateral edema of lower extremity 06/04/2015  . Depression due to dementia 06/04/2015  . Hypertensive heart disease with heart failure ([place]) 02/11/2015  . Generalized anxiety disorder 02/11/2015  . Malignant neoplasm of female breast ([place]) 02/11/2015  . Dementia without behavioral disturbance 11/12/2014  . Anxiety state 11/12/2014  . Essential hypertension 09/28/2014  . CKD (chronic kidney disease) stage 4, GFR 15-29 ml/min (HCC) 09/28/2014  .  PVD (peripheral vascular disease) ([place]) 09/28/2014  . Hypertensive heart failure ([place]) 09/28/2014  . Primary generalized (osteo)arthritis 09/28/2014  . Dementia, senile 09/28/2014  . Neoplasm of breast 09/28/2014  . Age-related nuclear cataract of left eye 09/28/2014  . Hypokalemia 10/01/2011    Orientation RESPIRATION BLADDER Height & Weight     Self  O2 (2L) Incontinent Weight: 176 lb 1.6 oz (79.9 kg) Height:  5\' 2"  (157.5 cm)  BEHAVIORAL SYMPTOMS/MOOD NEUROLOGICAL BOWEL NUTRITION STATUS  [place]anderer   Incontinent Heart Healthy  AMBULATORY STATUS COMMUNICATION OF NEEDS Skin   Limited Assist Verbally PU Stage and Appropriate Care PU Stage 1 Dressing: No Dressing                     Personal Care Assistance Level of Assistance  Bathing, Feeding, Dressing Bathing Assistance: Limited assistance Feeding assistance: Limited assistance Dressing Assistance: Limited assistance     Functional Limitations Info  Sight, Hearing, Speech Sight Info: Adequate Hearing Info: Adequate Speech Info: Adequate    SPECIAL CARE FACTORS FREQUENCY                       Contractures Contractures Info: Not present    Additional Factors Info  Code Status, Allergies, Insulin Sliding Scale Code Status Info: DNR Allergies Info: Levaquin Levofloxacin In D5w, Penicillins, Latex   Insulin Sliding Scale Info: Novolog - 3times daily with meals and bedtime       Current Medications (09/07/2016):  This is the current hospital active medication list Current Facility-Administered Medications  Medication Dose Route Frequency Provider Last Rate Last Dose  .  acetaminophen (TYLENOL) tablet 650 mg  650 mg Oral Q4H PRN Samella Parr, NP      . ALPRAZolam Duanne Moron) tablet 0.25 mg  0.25 mg Oral Q8H PRN Samella Parr, NP   0.25 mg at 09/06/16 2354  . aspirin EC tablet 81 mg  81 mg Oral Daily Pixie Casino, MD   81 mg at 09/07/16 1042  . cefTRIAXone (ROCEPHIN) 1 g in dextrose 5 % 50 mL IVPB  1 g  Intravenous Q24H Samella Parr, NP   1 g at 09/06/16 1304  . cholecalciferol (VITAMIN D) tablet 1,000 Units  1,000 Units Oral Daily Samella Parr, NP   1,000 Units at 09/07/16 1042  . cloNIDine (CATAPRES) tablet 0.3 mg  0.3 mg Oral BID Samella Parr, NP   0.3 mg at 09/07/16 1042  . insulin aspart (novoLOG) injection 0-5 Units  0-5 Units Subcutaneous QHS Samella Parr, NP      . insulin aspart (novoLOG) injection 0-9 Units  0-9 Units Subcutaneous TID WC Samella Parr, NP   2 Units at 09/03/16 1321  . lamoTRIgine (LAMICTAL) tablet 50 mg  50 mg Oral Daily Samella Parr, NP   50 mg at 09/07/16 1042  . metoprolol succinate (TOPROL-XL) 24 hr tablet 25 mg  25 mg Oral QHS Samella Parr, NP   25 mg at 09/06/16 2343  . morphine 2 MG/ML injection 2 mg  2 mg Intravenous Q2H PRN Elwin Mocha, MD   2 mg at 09/07/16 0015  . nitroGLYCERIN (NITROGLYN) 2 % ointment 0.5 inch  0.5 inch Topical Q6H Samella Parr, NP   0.5 inch at 09/07/16 0001  . nystatin-triamcinolone (MYCOLOG II) cream   Topical BID Samella Parr, NP      . senna-docusate (Senokot-S) tablet 1 tablet  1 tablet Oral QHS Samella Parr, NP   1 tablet at 09/06/16 2343  . simvastatin (ZOCOR) tablet 20 mg  20 mg Oral q1800 Samella Parr, NP   20 mg at 09/04/16 1800     Discharge Medications: Please see discharge summary for a list of discharge medications.  Relevant Imaging Results:  Relevant Lab Results:   Additional Information SSN [id number]   Barbette Or, [place]

## 2016-09-08 LAB — GLUCOSE, CAPILLARY: GLUCOSE-CAPILLARY: 109 mg/dL — AB (ref 65–99)

## 2016-09-08 NOTE — Progress Notes (Signed)
Patient was to be D/C'd yesterday and unfortunately could not go to SNF because they wanted the PASARR number from Education officer, museum and by the time Social Work provided [employer] Number to SNF, the SNF could not admit unitl AM because they could not get her meds yesterday night. Patient seen and examined this Am. No changes to D/C Summary done yesterday. Patient to be transferred to [facility] SNF this AM with Palliative Care to [place] at facility.

## 2016-09-08 NOTE — Clinical Social Work Placement (Signed)
   CLINICAL SOCIAL WORK PLACEMENT  NOTE  Date:  09/08/2016  Patient Details  Name: Kaylee Turner MRN: [id number] Date of Birth: January 10, 1931  Clinical Social Work is seeking post-discharge placement for this patient at the [place] level of care (*CSW will initial, date and re-position this form in  chart as items are completed):  Yes   Patient/family provided with [place] Work Department's list of facilities offering this level of care within the geographic area requested by the patient (or if unable, by the patient's family).  Yes   Patient/family informed of their freedom to choose among providers that offer the needed level of care, that participate in Medicare, Medicaid or managed care program needed by the patient, have an available bed and are willing to accept the patient.  Yes   Patient/family informed of [facility]'s ownership interest in [facility] and [facility], as well as of the fact that they are under no obligation to receive care at these facilities.  PASRR submitted to EDS on 09/07/16     PASRR number received on       Existing PASRR number confirmed on 09/07/16     FL2 transmitted to all facilities in geographic area requested by pt/family on 09/07/16     FL2 transmitted to all facilities within larger geographic area on       Patient informed that his/her managed care company has contracts with or will negotiate with certain facilities, including the following:        Yes   Patient/family informed of bed offers received.  Patient chooses bed at [facility]     Physician recommends and patient chooses bed at      Patient to be transferred to [facility] on 09/08/16.  Patient to be transferred to facility by Ambulance     Patient family notified on 09/08/16 of transfer.  Name of family member notified:  Hill,Cathy     PHYSICIAN Please prepare priority discharge summary, including medications, Please  prepare prescriptions, Please sign FL2, Please sign DNR     Additional Comment:   Per MD patient ready for DC to [place], patient, patient's family, and facility notified of DC. RN given number for report. DC packet on chart. Ambulance transport requested for patient. CSW signing off.  _______________________________________________ Rigoberto Noel, LCSW 09/08/2016, 11:13 AM

## 2016-10-23 DEATH — deceased
